# Patient Record
Sex: Male | Born: 1935 | Hispanic: No | Marital: Married | Smoking: Never smoker
Health system: Southern US, Community
[De-identification: ages and names within clinical notes are randomized; demographics above are authoritative.]

## PROBLEM LIST (undated history)

## (undated) DIAGNOSIS — R413 Other amnesia: Secondary | ICD-10-CM

## (undated) DIAGNOSIS — N200 Calculus of kidney: Secondary | ICD-10-CM

## (undated) DIAGNOSIS — I119 Hypertensive heart disease without heart failure: Secondary | ICD-10-CM

## (undated) DIAGNOSIS — R2689 Other abnormalities of gait and mobility: Secondary | ICD-10-CM

## (undated) DIAGNOSIS — M48061 Spinal stenosis, lumbar region without neurogenic claudication: Secondary | ICD-10-CM

## (undated) DIAGNOSIS — I6523 Occlusion and stenosis of bilateral carotid arteries: Secondary | ICD-10-CM

## (undated) DIAGNOSIS — I2510 Atherosclerotic heart disease of native coronary artery without angina pectoris: Secondary | ICD-10-CM

## (undated) DIAGNOSIS — M47816 Spondylosis without myelopathy or radiculopathy, lumbar region: Secondary | ICD-10-CM

## (undated) DIAGNOSIS — C801 Malignant (primary) neoplasm, unspecified: Secondary | ICD-10-CM

## (undated) DIAGNOSIS — R569 Unspecified convulsions: Secondary | ICD-10-CM

## (undated) DIAGNOSIS — N189 Chronic kidney disease, unspecified: Secondary | ICD-10-CM

## (undated) HISTORY — DX: Occlusion and stenosis of bilateral carotid arteries: I65.23

## (undated) HISTORY — DX: Other amnesia: R41.3

## (undated) HISTORY — PX: BOWEL RESECTION: SHX1257

## (undated) HISTORY — DX: Hypertensive heart disease without heart failure: I11.9

## (undated) HISTORY — PX: OTHER SURGICAL HISTORY: SHX169

## (undated) HISTORY — DX: Malignant (primary) neoplasm, unspecified: C80.1

## (undated) HISTORY — DX: Chronic kidney disease, unspecified: N18.9

## (undated) HISTORY — PX: BACK SURGERY: SHX140

## (undated) HISTORY — DX: Other abnormalities of gait and mobility: R26.89

## (undated) HISTORY — PX: CHOLECYSTECTOMY: SHX55

## (undated) HISTORY — DX: Calculus of kidney: N20.0

## (undated) HISTORY — DX: Spondylosis without myelopathy or radiculopathy, lumbar region: M47.816

## (undated) HISTORY — DX: Spinal stenosis, lumbar region without neurogenic claudication: M48.061

---

## 2001-07-16 ENCOUNTER — Encounter: Admission: RE | Admit: 2001-07-16 | Discharge: 2001-07-16 | Payer: Self-pay | Admitting: Gastroenterology

## 2001-07-16 ENCOUNTER — Encounter: Payer: Self-pay | Admitting: Gastroenterology

## 2013-03-24 ENCOUNTER — Other Ambulatory Visit: Payer: Self-pay | Admitting: Internal Medicine

## 2013-03-24 DIAGNOSIS — N289 Disorder of kidney and ureter, unspecified: Secondary | ICD-10-CM

## 2013-03-25 ENCOUNTER — Ambulatory Visit
Admission: RE | Admit: 2013-03-25 | Discharge: 2013-03-25 | Disposition: A | Discharge status: Home / Self Care | Payer: Medicare Other | Source: Ambulatory Visit | Attending: Internal Medicine | Admitting: Internal Medicine

## 2013-03-25 DIAGNOSIS — N289 Disorder of kidney and ureter, unspecified: Secondary | ICD-10-CM

## 2014-10-10 DIAGNOSIS — I351 Nonrheumatic aortic (valve) insufficiency: Secondary | ICD-10-CM | POA: Diagnosis not present

## 2014-10-10 DIAGNOSIS — E782 Mixed hyperlipidemia: Secondary | ICD-10-CM | POA: Diagnosis not present

## 2014-10-10 DIAGNOSIS — I25119 Atherosclerotic heart disease of native coronary artery with unspecified angina pectoris: Secondary | ICD-10-CM | POA: Diagnosis not present

## 2014-10-19 DIAGNOSIS — I2510 Atherosclerotic heart disease of native coronary artery without angina pectoris: Secondary | ICD-10-CM | POA: Diagnosis not present

## 2014-10-19 DIAGNOSIS — I501 Left ventricular failure: Secondary | ICD-10-CM | POA: Diagnosis not present

## 2014-10-19 DIAGNOSIS — E782 Mixed hyperlipidemia: Secondary | ICD-10-CM | POA: Diagnosis not present

## 2014-11-15 DIAGNOSIS — E559 Vitamin D deficiency, unspecified: Secondary | ICD-10-CM | POA: Diagnosis not present

## 2014-11-15 DIAGNOSIS — N183 Chronic kidney disease, stage 3 (moderate): Secondary | ICD-10-CM | POA: Diagnosis not present

## 2014-11-15 DIAGNOSIS — I1310 Hypertensive heart and chronic kidney disease without heart failure, with stage 1 through stage 4 chronic kidney disease, or unspecified chronic kidney disease: Secondary | ICD-10-CM | POA: Diagnosis not present

## 2014-11-16 DIAGNOSIS — D225 Melanocytic nevi of trunk: Secondary | ICD-10-CM | POA: Diagnosis not present

## 2014-11-16 DIAGNOSIS — L821 Other seborrheic keratosis: Secondary | ICD-10-CM | POA: Diagnosis not present

## 2014-11-16 DIAGNOSIS — L570 Actinic keratosis: Secondary | ICD-10-CM | POA: Diagnosis not present

## 2014-11-16 DIAGNOSIS — D1801 Hemangioma of skin and subcutaneous tissue: Secondary | ICD-10-CM | POA: Diagnosis not present

## 2014-11-16 DIAGNOSIS — N183 Chronic kidney disease, stage 3 (moderate): Secondary | ICD-10-CM | POA: Diagnosis not present

## 2014-12-06 DIAGNOSIS — M5126 Other intervertebral disc displacement, lumbar region: Secondary | ICD-10-CM | POA: Diagnosis not present

## 2014-12-06 DIAGNOSIS — M4806 Spinal stenosis, lumbar region: Secondary | ICD-10-CM | POA: Diagnosis not present

## 2014-12-06 DIAGNOSIS — M47816 Spondylosis without myelopathy or radiculopathy, lumbar region: Secondary | ICD-10-CM | POA: Diagnosis not present

## 2014-12-27 DIAGNOSIS — Z0000 Encounter for general adult medical examination without abnormal findings: Secondary | ICD-10-CM | POA: Diagnosis not present

## 2014-12-27 DIAGNOSIS — E782 Mixed hyperlipidemia: Secondary | ICD-10-CM | POA: Diagnosis not present

## 2014-12-27 DIAGNOSIS — Z8349 Family history of other endocrine, nutritional and metabolic diseases: Secondary | ICD-10-CM | POA: Diagnosis not present

## 2014-12-27 DIAGNOSIS — Z1389 Encounter for screening for other disorder: Secondary | ICD-10-CM | POA: Diagnosis not present

## 2014-12-27 DIAGNOSIS — N183 Chronic kidney disease, stage 3 (moderate): Secondary | ICD-10-CM | POA: Diagnosis not present

## 2015-05-17 DIAGNOSIS — L821 Other seborrheic keratosis: Secondary | ICD-10-CM | POA: Diagnosis not present

## 2015-05-17 DIAGNOSIS — L570 Actinic keratosis: Secondary | ICD-10-CM | POA: Diagnosis not present

## 2015-05-22 DIAGNOSIS — I1310 Hypertensive heart and chronic kidney disease without heart failure, with stage 1 through stage 4 chronic kidney disease, or unspecified chronic kidney disease: Secondary | ICD-10-CM | POA: Diagnosis not present

## 2015-05-22 DIAGNOSIS — I10 Essential (primary) hypertension: Secondary | ICD-10-CM | POA: Diagnosis not present

## 2015-05-22 DIAGNOSIS — E559 Vitamin D deficiency, unspecified: Secondary | ICD-10-CM | POA: Diagnosis not present

## 2015-05-22 DIAGNOSIS — N183 Chronic kidney disease, stage 3 (moderate): Secondary | ICD-10-CM | POA: Diagnosis not present

## 2015-05-31 DIAGNOSIS — I10 Essential (primary) hypertension: Secondary | ICD-10-CM | POA: Diagnosis not present

## 2015-05-31 DIAGNOSIS — N183 Chronic kidney disease, stage 3 (moderate): Secondary | ICD-10-CM | POA: Diagnosis not present

## 2015-07-05 DIAGNOSIS — M47816 Spondylosis without myelopathy or radiculopathy, lumbar region: Secondary | ICD-10-CM | POA: Diagnosis not present

## 2015-07-06 DIAGNOSIS — Z23 Encounter for immunization: Secondary | ICD-10-CM | POA: Diagnosis not present

## 2015-07-10 DIAGNOSIS — M4806 Spinal stenosis, lumbar region: Secondary | ICD-10-CM | POA: Diagnosis not present

## 2015-07-10 DIAGNOSIS — M5126 Other intervertebral disc displacement, lumbar region: Secondary | ICD-10-CM | POA: Diagnosis not present

## 2015-07-10 DIAGNOSIS — Z9889 Other specified postprocedural states: Secondary | ICD-10-CM | POA: Diagnosis not present

## 2015-07-10 DIAGNOSIS — M47816 Spondylosis without myelopathy or radiculopathy, lumbar region: Secondary | ICD-10-CM | POA: Diagnosis not present

## 2015-07-10 DIAGNOSIS — M545 Low back pain: Secondary | ICD-10-CM | POA: Diagnosis not present

## 2015-07-13 DIAGNOSIS — G9389 Other specified disorders of brain: Secondary | ICD-10-CM | POA: Diagnosis not present

## 2015-07-13 DIAGNOSIS — M4806 Spinal stenosis, lumbar region: Secondary | ICD-10-CM | POA: Diagnosis not present

## 2015-07-13 DIAGNOSIS — M5126 Other intervertebral disc displacement, lumbar region: Secondary | ICD-10-CM | POA: Diagnosis not present

## 2015-07-13 DIAGNOSIS — M47816 Spondylosis without myelopathy or radiculopathy, lumbar region: Secondary | ICD-10-CM | POA: Diagnosis not present

## 2015-07-17 DIAGNOSIS — C61 Malignant neoplasm of prostate: Secondary | ICD-10-CM | POA: Diagnosis not present

## 2015-07-20 DIAGNOSIS — I6523 Occlusion and stenosis of bilateral carotid arteries: Secondary | ICD-10-CM | POA: Diagnosis not present

## 2015-07-20 DIAGNOSIS — I119 Hypertensive heart disease without heart failure: Secondary | ICD-10-CM | POA: Diagnosis not present

## 2015-07-20 DIAGNOSIS — G9389 Other specified disorders of brain: Secondary | ICD-10-CM | POA: Diagnosis not present

## 2015-07-20 DIAGNOSIS — R55 Syncope and collapse: Secondary | ICD-10-CM | POA: Diagnosis not present

## 2015-07-20 DIAGNOSIS — M47816 Spondylosis without myelopathy or radiculopathy, lumbar region: Secondary | ICD-10-CM | POA: Diagnosis not present

## 2015-07-24 DIAGNOSIS — Z87442 Personal history of urinary calculi: Secondary | ICD-10-CM | POA: Diagnosis not present

## 2015-07-24 DIAGNOSIS — C61 Malignant neoplasm of prostate: Secondary | ICD-10-CM | POA: Diagnosis not present

## 2015-07-24 DIAGNOSIS — R351 Nocturia: Secondary | ICD-10-CM | POA: Diagnosis not present

## 2015-08-25 ENCOUNTER — Encounter: Payer: Self-pay | Admitting: Vascular Surgery

## 2015-08-25 ENCOUNTER — Other Ambulatory Visit: Payer: Self-pay | Admitting: *Deleted

## 2015-08-25 DIAGNOSIS — I6521 Occlusion and stenosis of right carotid artery: Secondary | ICD-10-CM

## 2015-08-28 DIAGNOSIS — Z6822 Body mass index (BMI) 22.0-22.9, adult: Secondary | ICD-10-CM | POA: Diagnosis not present

## 2015-08-28 DIAGNOSIS — M5136 Other intervertebral disc degeneration, lumbar region: Secondary | ICD-10-CM | POA: Diagnosis not present

## 2015-08-28 DIAGNOSIS — Z8546 Personal history of malignant neoplasm of prostate: Secondary | ICD-10-CM | POA: Diagnosis not present

## 2015-08-28 DIAGNOSIS — I6529 Occlusion and stenosis of unspecified carotid artery: Secondary | ICD-10-CM | POA: Diagnosis not present

## 2015-08-28 DIAGNOSIS — Z1389 Encounter for screening for other disorder: Secondary | ICD-10-CM | POA: Diagnosis not present

## 2015-08-28 DIAGNOSIS — I25118 Atherosclerotic heart disease of native coronary artery with other forms of angina pectoris: Secondary | ICD-10-CM | POA: Diagnosis not present

## 2015-09-09 DIAGNOSIS — J069 Acute upper respiratory infection, unspecified: Secondary | ICD-10-CM | POA: Diagnosis not present

## 2015-09-09 DIAGNOSIS — Z719 Counseling, unspecified: Secondary | ICD-10-CM | POA: Diagnosis not present

## 2015-09-19 ENCOUNTER — Encounter: Payer: Self-pay | Admitting: Vascular Surgery

## 2015-09-24 DIAGNOSIS — R569 Unspecified convulsions: Secondary | ICD-10-CM

## 2015-09-24 HISTORY — DX: Unspecified convulsions: R56.9

## 2015-09-26 ENCOUNTER — Ambulatory Visit (HOSPITAL_COMMUNITY)
Admission: RE | Admit: 2015-09-26 | Discharge: 2015-09-26 | Disposition: A | Discharge status: Home / Self Care | Payer: Medicare Other | Source: Ambulatory Visit | Attending: Vascular Surgery | Admitting: Vascular Surgery

## 2015-09-26 ENCOUNTER — Ambulatory Visit (INDEPENDENT_AMBULATORY_CARE_PROVIDER_SITE_OTHER): Payer: Medicare Other | Admitting: Vascular Surgery

## 2015-09-26 ENCOUNTER — Encounter: Payer: Self-pay | Admitting: Vascular Surgery

## 2015-09-26 VITALS — BP 169/66 | HR 50 | Temp 97.7°F | Resp 24 | Ht 71.0 in | Wt 163.2 lb

## 2015-09-26 DIAGNOSIS — I6521 Occlusion and stenosis of right carotid artery: Secondary | ICD-10-CM | POA: Insufficient documentation

## 2015-09-26 DIAGNOSIS — I6529 Occlusion and stenosis of unspecified carotid artery: Secondary | ICD-10-CM | POA: Insufficient documentation

## 2015-09-26 NOTE — Progress Notes (Signed)
Filed Vitals:   09/26/15 1124 09/26/15 1126  BP: 153/71 170/68  Pulse: 50   Temp: 97.7 F (36.5 C)   TempSrc: Oral   Resp: 24   Height: 5\' 11"  (1.803 m)   Weight: 163 lb 3.2 oz (74.027 kg)    Filed Vitals:   09/26/15 1126 09/26/15 1132  BP: 170/68 169/66  Pulse:    Temp:    Resp:

## 2015-09-26 NOTE — Progress Notes (Signed)
Vascular and Vein Specialist of [facility]  Patient name: Larry Ortega MRN: [id number] DOB: 31-Dec-1923 Sex: male  REASON FOR CONSULT:  Asymptomatic carotid disease  HPI: Larry Ortega is a 82 y.o. male, who is  Seen today for discussion of asymptomatic carotid disease. He had a recent duplex at [facility] adjusting 70-99% stenosis in his right internal carotid artery and less than 50% stenosis on the left internal carotid artery. He is right-handed.  He specifically denies any prior episodes of stroke, transient ischemic attack or amaurosis fugax. Does have a history of lumbar disc disease and has some numbness in his left leg apparently due to  Radicular symptoms. He has no history of cardiac difficulties. No history of lower from the arterial insufficiency  Past Medical History  Diagnosis Date  . Lumbar spondylosis   . Lumbar spinal stenosis   . Bilateral carotid artery occlusion   . Hypertensive heart disease without congestive heart failure   . Chronic kidney disease   . Cancer [facility])     prostate    History reviewed. No pertinent family history.  SOCIAL HISTORY: Social History   Social History  . Marital Status: Married    Spouse Name: N/A  . Number of Children: N/A  . Years of Education: N/A   Occupational History  . Not on file.   Social History Main Topics  . Smoking status: Never Smoker   . Smokeless tobacco: Never Used  . Alcohol Use: Not on file  . Drug Use: Not on file  . Sexual Activity: Not on file   Other Topics Concern  . Not on file   Social History Narrative    No Known Allergies  Current Outpatient Prescriptions  Medication Sig Dispense Refill  . aspirin 81 MG tablet Take 81 mg by mouth daily.    . clopidogrel (PLAVIX) 75 MG tablet Take 75 mg by mouth daily.    . metoprolol tartrate (LOPRESSOR) 25 MG tablet Take 25 mg by mouth 2 (two) times daily.    Marland Kitchen omeprazole (PRILOSEC) 20 MG capsule Take 20 mg by mouth daily.    . simvastatin  (ZOCOR) 20 MG tablet Take 20 mg by mouth daily.     No current facility-administered medications for this visit.    REVIEW OF SYSTEMS:  [X]  denotes positive finding, [ ]  denotes negative finding Cardiac  Comments:  Chest pain or chest pressure:    Shortness of breath upon exertion:    Short of breath when lying flat:    Irregular heart rhythm:        Vascular    Pain in calf, thigh, or hip brought on by ambulation: x   Pain in feet at night that wakes you up from your sleep:     Blood clot in your veins:    Leg swelling:         Pulmonary    Oxygen at home:    Productive cough:     Wheezing:         Neurologic    Sudden weakness in arms or legs:  x   Sudden numbness in arms or legs:  x   Sudden onset of difficulty speaking or slurred speech:    Temporary loss of vision in one eye:     Problems with dizziness:         Gastrointestinal    Blood in stool:     Vomited blood:         Genitourinary  Burning when urinating:     Blood in urine:        Psychiatric    Major depression:         Hematologic    Bleeding problems:    Problems with blood clotting too easily:        Skin    Rashes or ulcers:        Constitutional    Fever or chills:      PHYSICAL EXAM: Filed Vitals:   09/26/15 1124 09/26/15 1126 09/26/15 1132  BP: 153/71 170/68 169/66  Pulse: 50    Temp: 97.7 F (36.5 C)    TempSrc: Oral    Resp: 24    Height: 5\' 11"  (1.803 m)    Weight: 163 lb 3.2 oz (74.027 kg)      GENERAL: The patient is a well-nourished male, in no acute distress. The vital signs are documented above. CARDIAC: There is a regular rate and rhythm.  VASCULAR:  2+ radial and 2+ dorsalis pedis pulses bilaterally. Carotid arteries without bruits bilaterally PULMONARY: There is good air exchange bilaterally without wheezing or rales. ABDOMEN: Soft and non-tender with normal pitched bowel sounds.  No aneurysm palpable MUSCULOSKELETAL: There are no major deformities or  cyanosis. NEUROLOGIC: No focal weakness or paresthesias are detected. SKIN: There are no ulcers or rashes noted. PSYCHIATRIC: The patient has a normal affect.  DATA:   I did review his carotid duplex from [facility]. He had repeat imaging of the right carotid in our lab today as well. His right systolic velocity in the internal carotid was 378 cm/s. End-diastolic velocities were 48 cm/s. This is very similar to the results found at [facility].  MEDICAL ISSUES:  had long discussion with the patient's wife present. I explained that he clearly has no symptoms related to his right carotid stenosis. I did explain symptoms that this would calls. He is right-handed. I explained that in our lab the same velocities would be an targeted has a 40-59% stenosis. I explained that he is below the threshold where we would recommend endarterectomy for asymptomatic disease. I did suggest ongoing 6 month interval follow-up to rule out any regression and is asymptomatic disease. Also explained that if he did have symptoms that were clearly related to his right hemisphere we would recommend endarterectomy based on this. He was reassured with this discussion and we will see him again in 6 months with repeat carotid duplex.   Curt Jews [facility]ists of [place]: [phone]

## 2015-09-27 ENCOUNTER — Encounter: Payer: Self-pay | Admitting: Neurosurgery

## 2015-09-28 ENCOUNTER — Other Ambulatory Visit: Payer: Self-pay | Admitting: *Deleted

## 2015-09-28 DIAGNOSIS — I6523 Occlusion and stenosis of bilateral carotid arteries: Secondary | ICD-10-CM

## 2015-10-10 DIAGNOSIS — I7389 Other specified peripheral vascular diseases: Secondary | ICD-10-CM | POA: Diagnosis not present

## 2015-10-10 DIAGNOSIS — R001 Bradycardia, unspecified: Secondary | ICD-10-CM | POA: Diagnosis not present

## 2015-10-10 DIAGNOSIS — I350 Nonrheumatic aortic (valve) stenosis: Secondary | ICD-10-CM | POA: Diagnosis not present

## 2015-10-13 DIAGNOSIS — I25119 Atherosclerotic heart disease of native coronary artery with unspecified angina pectoris: Secondary | ICD-10-CM | POA: Diagnosis not present

## 2015-10-13 DIAGNOSIS — E782 Mixed hyperlipidemia: Secondary | ICD-10-CM | POA: Diagnosis not present

## 2015-10-24 DIAGNOSIS — I371 Nonrheumatic pulmonary valve insufficiency: Secondary | ICD-10-CM | POA: Diagnosis not present

## 2015-10-24 DIAGNOSIS — I361 Nonrheumatic tricuspid (valve) insufficiency: Secondary | ICD-10-CM | POA: Diagnosis not present

## 2015-10-24 DIAGNOSIS — I351 Nonrheumatic aortic (valve) insufficiency: Secondary | ICD-10-CM | POA: Diagnosis not present

## 2015-10-24 DIAGNOSIS — I340 Nonrheumatic mitral (valve) insufficiency: Secondary | ICD-10-CM | POA: Diagnosis not present

## 2015-11-02 DIAGNOSIS — L821 Other seborrheic keratosis: Secondary | ICD-10-CM | POA: Diagnosis not present

## 2015-11-02 DIAGNOSIS — L570 Actinic keratosis: Secondary | ICD-10-CM | POA: Diagnosis not present

## 2015-11-10 DIAGNOSIS — N183 Chronic kidney disease, stage 3 (moderate): Secondary | ICD-10-CM | POA: Diagnosis not present

## 2015-11-10 DIAGNOSIS — I1310 Hypertensive heart and chronic kidney disease without heart failure, with stage 1 through stage 4 chronic kidney disease, or unspecified chronic kidney disease: Secondary | ICD-10-CM | POA: Diagnosis not present

## 2015-11-29 DIAGNOSIS — H02221 Mechanical lagophthalmos right upper eyelid: Secondary | ICD-10-CM | POA: Diagnosis not present

## 2015-11-29 DIAGNOSIS — Z961 Presence of intraocular lens: Secondary | ICD-10-CM | POA: Diagnosis not present

## 2015-11-29 DIAGNOSIS — H02052 Trichiasis without entropian right lower eyelid: Secondary | ICD-10-CM | POA: Diagnosis not present

## 2015-12-05 DIAGNOSIS — M47816 Spondylosis without myelopathy or radiculopathy, lumbar region: Secondary | ICD-10-CM | POA: Diagnosis not present

## 2015-12-05 DIAGNOSIS — M4806 Spinal stenosis, lumbar region: Secondary | ICD-10-CM | POA: Diagnosis not present

## 2015-12-05 DIAGNOSIS — I6523 Occlusion and stenosis of bilateral carotid arteries: Secondary | ICD-10-CM | POA: Diagnosis not present

## 2015-12-11 DIAGNOSIS — R3916 Straining to void: Secondary | ICD-10-CM | POA: Diagnosis not present

## 2015-12-11 DIAGNOSIS — C61 Malignant neoplasm of prostate: Secondary | ICD-10-CM | POA: Diagnosis not present

## 2015-12-11 DIAGNOSIS — R351 Nocturia: Secondary | ICD-10-CM | POA: Diagnosis not present

## 2015-12-20 DIAGNOSIS — R351 Nocturia: Secondary | ICD-10-CM | POA: Diagnosis not present

## 2015-12-20 DIAGNOSIS — R3916 Straining to void: Secondary | ICD-10-CM | POA: Diagnosis not present

## 2015-12-20 DIAGNOSIS — C61 Malignant neoplasm of prostate: Secondary | ICD-10-CM | POA: Diagnosis not present

## 2016-01-08 DIAGNOSIS — N183 Chronic kidney disease, stage 3 (moderate): Secondary | ICD-10-CM | POA: Diagnosis not present

## 2016-01-08 DIAGNOSIS — I25118 Atherosclerotic heart disease of native coronary artery with other forms of angina pectoris: Secondary | ICD-10-CM | POA: Diagnosis not present

## 2016-01-08 DIAGNOSIS — Z125 Encounter for screening for malignant neoplasm of prostate: Secondary | ICD-10-CM | POA: Diagnosis not present

## 2016-01-08 DIAGNOSIS — Z0000 Encounter for general adult medical examination without abnormal findings: Secondary | ICD-10-CM | POA: Diagnosis not present

## 2016-01-15 DIAGNOSIS — I25118 Atherosclerotic heart disease of native coronary artery with other forms of angina pectoris: Secondary | ICD-10-CM | POA: Diagnosis not present

## 2016-01-15 DIAGNOSIS — M5136 Other intervertebral disc degeneration, lumbar region: Secondary | ICD-10-CM | POA: Diagnosis not present

## 2016-01-15 DIAGNOSIS — Z0000 Encounter for general adult medical examination without abnormal findings: Secondary | ICD-10-CM | POA: Diagnosis not present

## 2016-01-15 DIAGNOSIS — R7309 Other abnormal glucose: Secondary | ICD-10-CM | POA: Diagnosis not present

## 2016-01-15 DIAGNOSIS — Z13820 Encounter for screening for osteoporosis: Secondary | ICD-10-CM | POA: Diagnosis not present

## 2016-01-15 DIAGNOSIS — I6529 Occlusion and stenosis of unspecified carotid artery: Secondary | ICD-10-CM | POA: Diagnosis not present

## 2016-01-15 DIAGNOSIS — D692 Other nonthrombocytopenic purpura: Secondary | ICD-10-CM | POA: Diagnosis not present

## 2016-01-15 DIAGNOSIS — N183 Chronic kidney disease, stage 3 (moderate): Secondary | ICD-10-CM | POA: Diagnosis not present

## 2016-01-15 DIAGNOSIS — Z6822 Body mass index (BMI) 22.0-22.9, adult: Secondary | ICD-10-CM | POA: Diagnosis not present

## 2016-01-15 DIAGNOSIS — Z1389 Encounter for screening for other disorder: Secondary | ICD-10-CM | POA: Diagnosis not present

## 2016-01-15 DIAGNOSIS — Z8546 Personal history of malignant neoplasm of prostate: Secondary | ICD-10-CM | POA: Diagnosis not present

## 2016-03-11 DIAGNOSIS — I1310 Hypertensive heart and chronic kidney disease without heart failure, with stage 1 through stage 4 chronic kidney disease, or unspecified chronic kidney disease: Secondary | ICD-10-CM | POA: Diagnosis not present

## 2016-03-11 DIAGNOSIS — N183 Chronic kidney disease, stage 3 (moderate): Secondary | ICD-10-CM | POA: Diagnosis not present

## 2016-03-22 ENCOUNTER — Encounter: Payer: Self-pay | Admitting: Vascular Surgery

## 2016-04-02 ENCOUNTER — Ambulatory Visit (INDEPENDENT_AMBULATORY_CARE_PROVIDER_SITE_OTHER): Payer: Medicare Other | Admitting: Vascular Surgery

## 2016-04-02 ENCOUNTER — Ambulatory Visit (HOSPITAL_COMMUNITY)
Admission: RE | Admit: 2016-04-02 | Discharge: 2016-04-02 | Disposition: A | Discharge status: Home / Self Care | Payer: Medicare Other | Source: Ambulatory Visit | Attending: Vascular Surgery | Admitting: Vascular Surgery

## 2016-04-02 ENCOUNTER — Encounter: Payer: Self-pay | Admitting: Vascular Surgery

## 2016-04-02 VITALS — BP 128/62 | HR 47 | Temp 97.3°F | Resp 16 | Ht 72.0 in | Wt 160.0 lb

## 2016-04-02 DIAGNOSIS — N189 Chronic kidney disease, unspecified: Secondary | ICD-10-CM | POA: Insufficient documentation

## 2016-04-02 DIAGNOSIS — I1310 Hypertensive heart and chronic kidney disease without heart failure, with stage 1 through stage 4 chronic kidney disease, or unspecified chronic kidney disease: Secondary | ICD-10-CM | POA: Insufficient documentation

## 2016-04-02 DIAGNOSIS — I6521 Occlusion and stenosis of right carotid artery: Secondary | ICD-10-CM

## 2016-04-02 DIAGNOSIS — I6523 Occlusion and stenosis of bilateral carotid arteries: Secondary | ICD-10-CM | POA: Insufficient documentation

## 2016-04-02 NOTE — Progress Notes (Signed)
Vascular and Vein Specialist of [facility]  Patient name: Larry Ortega MRN: [id number] DOB: 10/10/1923 Sex: male  REASON FOR VISIT: Follow-up carotid stenosis, asymptomatic  HPI: [name] is a 82 y.o. male seen today for follow-up of asymptomatic carotid disease. This is been discovered with duplex 6 months ago. He is here today for follow-up. He reports no neurologic deficits.Specifically no amaurosis fugax, transient ischemic attack or stroke. He does have no new cardiac disease. He reports that he did step on a piece of glass the last week or 2. Appears to be healing.  Past Medical History  Diagnosis Date  . Lumbar spondylosis   . Lumbar spinal stenosis   . Bilateral carotid artery occlusion   . Hypertensive heart disease without congestive heart failure   . Chronic kidney disease   . Cancer [facility])     prostate    History reviewed. No pertinent family history.  SOCIAL HISTORY: Social History  Substance Use Topics  . Smoking status: Never Smoker   . Smokeless tobacco: Never Used  . Alcohol Use: Not on file    No Known Allergies  Current Outpatient Prescriptions  Medication Sig Dispense Refill  . aspirin 81 MG tablet Take 81 mg by mouth daily.    . clopidogrel (PLAVIX) 75 MG tablet Take 75 mg by mouth daily.    . metoprolol tartrate (LOPRESSOR) 25 MG tablet Take 25 mg by mouth 2 (two) times daily.    Marland Kitchen omeprazole (PRILOSEC) 20 MG capsule Take 20 mg by mouth daily.    . simvastatin (ZOCOR) 20 MG tablet Take 20 mg by mouth daily.     No current facility-administered medications for this visit.    REVIEW OF SYSTEMS:  [X]  denotes positive finding, [ ]  denotes negative finding Cardiac  Comments:  Chest pain or chest pressure:    Shortness of breath upon exertion:    Short of breath when lying flat:    Irregular heart rhythm:        Vascular    Pain in calf, thigh, or hip brought on by ambulation:    Pain in feet at night  that wakes you up from your sleep:     Blood clot in your veins:    Leg swelling:         Pulmonary    Oxygen at home:    Productive cough:     Wheezing:         Neurologic    Sudden weakness in arms or legs:     Sudden numbness in arms or legs:     Sudden onset of difficulty speaking or slurred speech:    Temporary loss of vision in one eye:     Problems with dizziness:         Gastrointestinal    Blood in stool:     Vomited blood:         Genitourinary    Burning when urinating:     Blood in urine:        Psychiatric    Major depression:         Hematologic    Bleeding problems:    Problems with blood clotting too easily:        Skin    Rashes or ulcers:        Constitutional    Fever or chills:      PHYSICAL EXAM: Filed Vitals:   04/02/16 1005  BP: 128/62  Pulse: 47  Temp: 97.3 F (  36.3 C)  TempSrc: Oral  Resp: 16  Height: 6' (1.829 m)  Weight: 160 lb (72.576 kg)  SpO2: 97%    GENERAL: The patient is a well-nourished male, in no acute distress. The vital signs are documented above. CARDIAC: There is a regular rate and rhythm.  VASCULAR: 2+ radial and 2+ posterior tibial pulses bilaterally. I do not hear carotid bruits today. PULMONARY: There is good air exchange bilaterally without wheezing or rales. MUSCULOSKELETAL: There are no major deformities or cyanosis. NEUROLOGIC: No focal weakness or paresthesias are detected. SKIN: There are no ulcers or rashes noted. PSYCHIATRIC: The patient has a normal affect.  DATA:  Carotid duplex today was compared to study from 6 months ago. This does show 40-59% stenosis in the right internal carotid artery and no significant stenosis left carotid.  MEDICAL ISSUES: Stable asymptomatic carotid stenosis. Assistant at length with patient. Recommend yearly duplex to rule out progression. Again reviewed symptoms of carotid disease he knows to notify us immediately should this occur. We'll follow up in one year with  duplex and evaluation with our nurse practitioner    Rosetta Posner, MD [facility] V[facility] and Vein Specialists of [facility] Tel [phone] Pager [phone]

## 2016-04-20 ENCOUNTER — Encounter (HOSPITAL_COMMUNITY): Payer: Self-pay | Admitting: Emergency Medicine

## 2016-04-20 ENCOUNTER — Inpatient Hospital Stay (HOSPITAL_COMMUNITY): Payer: Medicare Other

## 2016-04-20 ENCOUNTER — Inpatient Hospital Stay (HOSPITAL_COMMUNITY)
Admission: EM | Admit: 2016-04-20 | Discharge: 2016-04-25 | DRG: 100 | Disposition: A | Discharge status: Rehabilitation Facility | Payer: Medicare Other | Attending: Internal Medicine | Admitting: Internal Medicine

## 2016-04-20 ENCOUNTER — Emergency Department (HOSPITAL_COMMUNITY): Payer: Medicare Other

## 2016-04-20 DIAGNOSIS — I6300 Cerebral infarction due to thrombosis of unspecified precerebral artery: Secondary | ICD-10-CM | POA: Diagnosis not present

## 2016-04-20 DIAGNOSIS — I639 Cerebral infarction, unspecified: Secondary | ICD-10-CM | POA: Diagnosis not present

## 2016-04-20 DIAGNOSIS — Z23 Encounter for immunization: Secondary | ICD-10-CM | POA: Diagnosis not present

## 2016-04-20 DIAGNOSIS — I63512 Cerebral infarction due to unspecified occlusion or stenosis of left middle cerebral artery: Secondary | ICD-10-CM | POA: Diagnosis present

## 2016-04-20 DIAGNOSIS — R34 Anuria and oliguria: Secondary | ICD-10-CM | POA: Diagnosis present

## 2016-04-20 DIAGNOSIS — I6789 Other cerebrovascular disease: Secondary | ICD-10-CM | POA: Diagnosis not present

## 2016-04-20 DIAGNOSIS — W19XXXA Unspecified fall, initial encounter: Secondary | ICD-10-CM

## 2016-04-20 DIAGNOSIS — E876 Hypokalemia: Secondary | ICD-10-CM | POA: Diagnosis not present

## 2016-04-20 DIAGNOSIS — Z7902 Long term (current) use of antithrombotics/antiplatelets: Secondary | ICD-10-CM

## 2016-04-20 DIAGNOSIS — R29818 Other symptoms and signs involving the nervous system: Secondary | ICD-10-CM | POA: Diagnosis not present

## 2016-04-20 DIAGNOSIS — G40409 Other generalized epilepsy and epileptic syndromes, not intractable, without status epilepticus: Principal | ICD-10-CM | POA: Diagnosis present

## 2016-04-20 DIAGNOSIS — I69320 Aphasia following cerebral infarction: Secondary | ICD-10-CM | POA: Diagnosis not present

## 2016-04-20 DIAGNOSIS — R451 Restlessness and agitation: Secondary | ICD-10-CM | POA: Diagnosis present

## 2016-04-20 DIAGNOSIS — W1839XA Other fall on same level, initial encounter: Secondary | ICD-10-CM | POA: Diagnosis not present

## 2016-04-20 DIAGNOSIS — Z66 Do not resuscitate: Secondary | ICD-10-CM | POA: Diagnosis present

## 2016-04-20 DIAGNOSIS — R29810 Facial weakness: Secondary | ICD-10-CM | POA: Diagnosis present

## 2016-04-20 DIAGNOSIS — R4701 Aphasia: Secondary | ICD-10-CM | POA: Diagnosis present

## 2016-04-20 DIAGNOSIS — Z8673 Personal history of transient ischemic attack (TIA), and cerebral infarction without residual deficits: Secondary | ICD-10-CM | POA: Diagnosis not present

## 2016-04-20 DIAGNOSIS — N179 Acute kidney failure, unspecified: Secondary | ICD-10-CM | POA: Diagnosis present

## 2016-04-20 DIAGNOSIS — B004 Herpesviral encephalitis: Secondary | ICD-10-CM | POA: Diagnosis present

## 2016-04-20 DIAGNOSIS — R197 Diarrhea, unspecified: Secondary | ICD-10-CM | POA: Diagnosis not present

## 2016-04-20 DIAGNOSIS — Z79899 Other long term (current) drug therapy: Secondary | ICD-10-CM | POA: Diagnosis not present

## 2016-04-20 DIAGNOSIS — Y92230 Patient room in hospital as the place of occurrence of the external cause: Secondary | ICD-10-CM | POA: Diagnosis not present

## 2016-04-20 DIAGNOSIS — I6340 Cerebral infarction due to embolism of unspecified cerebral artery: Secondary | ICD-10-CM

## 2016-04-20 DIAGNOSIS — S0990XA Unspecified injury of head, initial encounter: Secondary | ICD-10-CM | POA: Diagnosis not present

## 2016-04-20 DIAGNOSIS — J9600 Acute respiratory failure, unspecified whether with hypoxia or hypercapnia: Secondary | ICD-10-CM | POA: Diagnosis present

## 2016-04-20 DIAGNOSIS — A419 Sepsis, unspecified organism: Secondary | ICD-10-CM | POA: Diagnosis not present

## 2016-04-20 DIAGNOSIS — R269 Unspecified abnormalities of gait and mobility: Secondary | ICD-10-CM | POA: Diagnosis not present

## 2016-04-20 DIAGNOSIS — Z7982 Long term (current) use of aspirin: Secondary | ICD-10-CM | POA: Diagnosis not present

## 2016-04-20 DIAGNOSIS — R739 Hyperglycemia, unspecified: Secondary | ICD-10-CM | POA: Diagnosis present

## 2016-04-20 DIAGNOSIS — I119 Hypertensive heart disease without heart failure: Secondary | ICD-10-CM | POA: Diagnosis present

## 2016-04-20 DIAGNOSIS — R569 Unspecified convulsions: Secondary | ICD-10-CM | POA: Diagnosis not present

## 2016-04-20 DIAGNOSIS — I6523 Occlusion and stenosis of bilateral carotid arteries: Secondary | ICD-10-CM | POA: Diagnosis present

## 2016-04-20 DIAGNOSIS — G9340 Encephalopathy, unspecified: Secondary | ICD-10-CM | POA: Diagnosis not present

## 2016-04-20 DIAGNOSIS — I10 Essential (primary) hypertension: Secondary | ICD-10-CM | POA: Diagnosis not present

## 2016-04-20 LAB — DIFFERENTIAL
Basophils Absolute: 0 10*3/uL (ref 0.0–0.1)
Basophils Relative: 0 %
EOS PCT: 3 %
Eosinophils Absolute: 0.3 10*3/uL (ref 0.0–0.7)
LYMPHS ABS: 2.7 10*3/uL (ref 0.7–4.0)
LYMPHS PCT: 28 %
MONO ABS: 0.6 10*3/uL (ref 0.1–1.0)
Monocytes Relative: 6 %
Neutro Abs: 5.9 10*3/uL (ref 1.7–7.7)
Neutrophils Relative %: 63 %

## 2016-04-20 LAB — POCT I-STAT 3, ART BLOOD GAS (G3+)
ACID-BASE EXCESS: 3 mmol/L — AB (ref 0.0–2.0)
BICARBONATE: 27.9 meq/L — AB (ref 20.0–24.0)
O2 SAT: 100 %
PCO2 ART: 43.1 mmHg (ref 35.0–45.0)
PH ART: 7.416 (ref 7.350–7.450)
PO2 ART: 478 mmHg — AB (ref 80.0–100.0)
Patient temperature: 97.7
TCO2: 29 mmol/L (ref 0–100)

## 2016-04-20 LAB — COMPREHENSIVE METABOLIC PANEL
ALK PHOS: 53 U/L (ref 38–126)
ALT: 24 U/L (ref 17–63)
ANION GAP: 8 (ref 5–15)
AST: 27 U/L (ref 15–41)
Albumin: 3.6 g/dL (ref 3.5–5.0)
BILIRUBIN TOTAL: 0.8 mg/dL (ref 0.3–1.2)
BUN: 19 mg/dL (ref 6–20)
CALCIUM: 9.6 mg/dL (ref 8.9–10.3)
CO2: 22 mmol/L (ref 22–32)
Chloride: 110 mmol/L (ref 101–111)
Creatinine, Ser: 1.47 mg/dL — ABNORMAL HIGH (ref 0.61–1.24)
GFR, EST AFRICAN AMERICAN: 47 mL/min — AB (ref >60)
GFR, EST NON AFRICAN AMERICAN: 40 mL/min — AB (ref >60)
Glucose, Bld: 111 mg/dL — ABNORMAL HIGH (ref 65–99)
Potassium: 4.8 mmol/L (ref 3.5–5.1)
Sodium: 140 mmol/L (ref 135–145)
TOTAL PROTEIN: 6.2 g/dL — AB (ref 6.5–8.1)

## 2016-04-20 LAB — URINALYSIS, ROUTINE W REFLEX MICROSCOPIC
Bilirubin Urine: NEGATIVE
Glucose, UA: NEGATIVE mg/dL
KETONES UR: NEGATIVE mg/dL
LEUKOCYTES UA: NEGATIVE
NITRITE: NEGATIVE
PH: 6.5 (ref 5.0–8.0)
Protein, ur: NEGATIVE mg/dL
SPECIFIC GRAVITY, URINE: 1.016 (ref 1.005–1.030)

## 2016-04-20 LAB — RAPID URINE DRUG SCREEN, HOSP PERFORMED
Amphetamines: NOT DETECTED
Barbiturates: NOT DETECTED
Benzodiazepines: POSITIVE — AB
Cocaine: NOT DETECTED
OPIATES: NOT DETECTED
TETRAHYDROCANNABINOL: NOT DETECTED

## 2016-04-20 LAB — URINE MICROSCOPIC-ADD ON
BACTERIA UA: NONE SEEN
WBC, UA: NONE SEEN WBC/hpf (ref 0–5)

## 2016-04-20 LAB — CBC
HCT: 40.1 % (ref 39.0–52.0)
HEMOGLOBIN: 13.2 g/dL (ref 13.0–17.0)
MCH: 30.6 pg (ref 26.0–34.0)
MCHC: 32.9 g/dL (ref 30.0–36.0)
MCV: 93 fL (ref 78.0–100.0)
Platelets: 232 10*3/uL (ref 150–400)
RBC: 4.31 MIL/uL (ref 4.22–5.81)
RDW: 13.6 % (ref 11.5–15.5)
WBC: 9.5 10*3/uL (ref 4.0–10.5)

## 2016-04-20 LAB — I-STAT CHEM 8, ED
BUN: 24 mg/dL — AB (ref 6–20)
CALCIUM ION: 1.13 mmol/L (ref 1.12–1.23)
CREATININE: 1.5 mg/dL — AB (ref 0.61–1.24)
Chloride: 107 mmol/L (ref 101–111)
Glucose, Bld: 108 mg/dL — ABNORMAL HIGH (ref 65–99)
HEMATOCRIT: 39 % (ref 39.0–52.0)
HEMOGLOBIN: 13.3 g/dL (ref 13.0–17.0)
POTASSIUM: 4.8 mmol/L (ref 3.5–5.1)
SODIUM: 141 mmol/L (ref 135–145)
TCO2: 23 mmol/L (ref 0–100)

## 2016-04-20 LAB — CBG MONITORING, ED
Glucose-Capillary: 107 mg/dL — ABNORMAL HIGH (ref 65–99)
Glucose-Capillary: 157 mg/dL — ABNORMAL HIGH (ref 65–99)

## 2016-04-20 LAB — APTT: aPTT: 30 seconds (ref 24–36)

## 2016-04-20 LAB — TRIGLYCERIDES: Triglycerides: 154 mg/dL — ABNORMAL HIGH (ref <150)

## 2016-04-20 LAB — PROTIME-INR
INR: 0.98
PROTHROMBIN TIME: 12.9 s (ref 11.4–15.2)

## 2016-04-20 LAB — ETHANOL: Alcohol, Ethyl (B): <5 mg/dL (ref <5)

## 2016-04-20 LAB — MRSA PCR SCREENING: MRSA by PCR: NEGATIVE

## 2016-04-20 LAB — GLUCOSE, CAPILLARY
GLUCOSE-CAPILLARY: 95 mg/dL (ref 65–99)
Glucose-Capillary: 139 mg/dL — ABNORMAL HIGH (ref 65–99)

## 2016-04-20 LAB — I-STAT TROPONIN, ED: TROPONIN I, POC: 0 ng/mL (ref 0.00–0.08)

## 2016-04-20 MED ORDER — CLOPIDOGREL BISULFATE 75 MG PO TABS
75.0000 mg | ORAL_TABLET | Freq: Every day | ORAL | Status: DC
  Administered 2016-04-21 – 2016-04-25 (×5): 75 mg
  Filled 2016-04-20 (×5): qty 1

## 2016-04-20 MED ORDER — CHLORHEXIDINE GLUCONATE 0.12% ORAL RINSE (MEDLINE KIT)
15.0000 mL | Freq: Two times a day (BID) | OROMUCOSAL | Status: DC
  Administered 2016-04-20 – 2016-04-24 (×8): 15 mL via OROMUCOSAL

## 2016-04-20 MED ORDER — ENOXAPARIN SODIUM 40 MG/0.4ML ~~LOC~~ SOLN
40.0000 mg | SUBCUTANEOUS | Status: DC
  Administered 2016-04-21 – 2016-04-25 (×5): 40 mg via SUBCUTANEOUS
  Filled 2016-04-20 (×5): qty 0.4

## 2016-04-20 MED ORDER — LORAZEPAM 2 MG/ML IJ SOLN
INTRAMUSCULAR | Status: AC
  Administered 2016-04-20: 1 mg via INTRAVENOUS
  Filled 2016-04-20: qty 1

## 2016-04-20 MED ORDER — SODIUM CHLORIDE 0.9 % IV SOLN: 250.0000 mL | INTRAVENOUS | Status: DC | PRN

## 2016-04-20 MED ORDER — HYDRALAZINE HCL 20 MG/ML IJ SOLN: 10.0000 mg | INTRAMUSCULAR | Status: DC | PRN

## 2016-04-20 MED ORDER — FENTANYL CITRATE (PF) 100 MCG/2ML IJ SOLN: 50.0000 ug | INTRAMUSCULAR | Status: DC | PRN

## 2016-04-20 MED ORDER — FENTANYL CITRATE (PF) 100 MCG/2ML IJ SOLN
50.0000 ug | INTRAMUSCULAR | Status: DC | PRN
  Administered 2016-04-22: 50 ug via INTRAVENOUS
  Filled 2016-04-20: qty 2

## 2016-04-20 MED ORDER — PROPOFOL 1000 MG/100ML IV EMUL
0.0000 ug/kg/min | INTRAVENOUS | Status: DC
  Administered 2016-04-20 (×2): 25 ug/kg/min via INTRAVENOUS
  Administered 2016-04-21: 20 ug/kg/min via INTRAVENOUS
  Administered 2016-04-21: 35 ug/kg/min via INTRAVENOUS
  Administered 2016-04-22: 20 ug/kg/min via INTRAVENOUS
  Administered 2016-04-22 – 2016-04-23 (×3): 30 ug/kg/min via INTRAVENOUS
  Administered 2016-04-23: 15 ug/kg/min via INTRAVENOUS
  Filled 2016-04-20 (×7): qty 100

## 2016-04-20 MED ORDER — FAMOTIDINE IN NACL 20-0.9 MG/50ML-% IV SOLN
20.0000 mg | INTRAVENOUS | Status: DC
  Administered 2016-04-20 – 2016-04-24 (×5): 20 mg via INTRAVENOUS
  Filled 2016-04-20 (×5): qty 50

## 2016-04-20 MED ORDER — FENTANYL CITRATE (PF) 100 MCG/2ML IJ SOLN
100.0000 ug | Freq: Once | INTRAMUSCULAR | Status: AC
  Administered 2016-04-20: 100 ug via INTRAVENOUS

## 2016-04-20 MED ORDER — LORAZEPAM 2 MG/ML IJ SOLN
2.0000 mg | INTRAMUSCULAR | Status: DC | PRN
  Administered 2016-04-20: 1 mg via INTRAVENOUS

## 2016-04-20 MED ORDER — ASPIRIN 81 MG PO CHEW
81.0000 mg | CHEWABLE_TABLET | Freq: Every day | ORAL | Status: DC
  Administered 2016-04-21 – 2016-04-25 (×5): 81 mg
  Filled 2016-04-20 (×5): qty 1

## 2016-04-20 MED ORDER — SODIUM CHLORIDE 0.9 % IV SOLN
500.0000 mg | Freq: Two times a day (BID) | INTRAVENOUS | Status: DC
  Administered 2016-04-22 – 2016-04-25 (×8): 500 mg via INTRAVENOUS
  Filled 2016-04-20 (×11): qty 5

## 2016-04-20 MED ORDER — SODIUM CHLORIDE 0.9 % IV SOLN
1000.0000 mg | Freq: Once | INTRAVENOUS | Status: AC
  Administered 2016-04-20: 1000 mg via INTRAVENOUS
  Filled 2016-04-20: qty 10

## 2016-04-20 MED ORDER — INSULIN ASPART 100 UNIT/ML ~~LOC~~ SOLN
0.0000 [IU] | SUBCUTANEOUS | Status: DC
  Administered 2016-04-20 – 2016-04-23 (×6): 1 [IU] via SUBCUTANEOUS
  Administered 2016-04-23 – 2016-04-24 (×2): 2 [IU] via SUBCUTANEOUS
  Administered 2016-04-24: 1 [IU] via SUBCUTANEOUS
  Administered 2016-04-24 – 2016-04-25 (×2): 2 [IU] via SUBCUTANEOUS
  Administered 2016-04-25 (×3): 1 [IU] via SUBCUTANEOUS

## 2016-04-20 MED ORDER — ANTISEPTIC ORAL RINSE SOLUTION (CORINZ)
7.0000 mL | OROMUCOSAL | Status: DC
  Administered 2016-04-20 – 2016-04-23 (×26): 7 mL via OROMUCOSAL

## 2016-04-20 MED ORDER — LORAZEPAM 2 MG/ML IJ SOLN
4.0000 mg | Freq: Once | INTRAMUSCULAR | Status: AC
  Administered 2016-04-20: 4 mg via INTRAVENOUS
  Filled 2016-04-20: qty 2

## 2016-04-20 MED ORDER — LORAZEPAM 2 MG/ML IJ SOLN
2.0000 mg | Freq: Once | INTRAMUSCULAR | Status: AC
  Administered 2016-04-20: 2 mg via INTRAVENOUS

## 2016-04-20 MED ORDER — ASPIRIN EC 81 MG PO TBEC: 81.0000 mg | DELAYED_RELEASE_TABLET | Freq: Every day | ORAL | Status: DC

## 2016-04-20 MED ORDER — CLOPIDOGREL BISULFATE 75 MG PO TABS: 75.0000 mg | ORAL_TABLET | Freq: Every day | ORAL | Status: DC

## 2016-04-20 NOTE — ED Provider Notes (Signed)
Pt d/w Dr. Titus Mould (ICU) who spoke with the family.  The decision was made to take pt to the ICU to intubate and get a MRI.  Depending on the MRI results, pt's family may withdrawal support.  Pt was made a DNR.   Isla Pence, MD 04/20/16 1714

## 2016-04-20 NOTE — ED Notes (Signed)
CBG 157.  Informed Lennette Bihari, RN.

## 2016-04-20 NOTE — ED Notes (Signed)
Dr Tasia Catchings into speak to family

## 2016-04-20 NOTE — Procedures (Signed)
Intubation Procedure Note Mariusz Shirazi [id number] 07-10-1924  Procedure: Intubation Indications: Airway protection and maintenance  Procedure Details Consent: Risks of procedure as well as the alternatives and risks of each were explained to the (patient/caregiver).  Consent for procedure obtained. Time Out: Verified patient identification, verified procedure, site/side was marked, verified correct patient position, special equipment/implants available, medications/allergies/relevent history reviewed, required imaging and test results available.  Performed  Maximum sterile technique was used including gloves, gown, hand hygiene, mask and sheet.  MAC and 4    Evaluation Hemodynamic Status: BP stable throughout; O2 sats: stable throughout Patient's Current Condition: stable Complications: No apparent complications Patient did tolerate procedure well. Chest X-ray ordered to verify placement.  CXR: pending.   Raylene Miyamoto 04/20/2016  I removed partials prior He had bilous mild material in airway suctioned, no sig aspiration during  Lavon Paganini. Titus Mould, MD, [place] Pgr: [place] [facility]

## 2016-04-20 NOTE — H&P (Signed)
PULMONARY / CRITICAL CARE MEDICINE   Name: Larry Ortega MRN: [id number] DOB: November 29, 1923    ADMISSION DATE:  04/20/2016 CONSULTATION DATE:  04/20/16  REFERRING MD:  EDP  CHIEF COMPLAINT:  AMS, seizure, facial droop  HISTORY OF PRESENT ILLNESS:   Larry Ortega is a 82 year old male with a PMH of carotid stenosis (followed by Dr. Donnetta Hutching) who presented to the ED with confusion and facial droop. This morning around 10:30am, he started feeling dizzy and walked outside. His family noticed he was confused. He walked out to his car, sat inside, and would not get out. He started talking funny and was not making sense. Family called 911. Per family, he has never had a stroke before.  At baseline, Pt is very healthy and active. He mows the lawn, takes full care of himself, completes all ADLs independently, takes care of his finances, etc.  In the ED, code stroke was called. He was evaluated by Dr. Tasia Catchings (Neurology) who thought he likely had a L MCA stroke, given his aphasia and right facial droop. CT head showed diffuse atrophy and low density area in the right frontal region that appeared chronic in nature. Around 3:45pm, he had a witnessed seizure in the ED. He was given 1mg  Ativan with resolution of seizure. PCCM was called for admission due to concern that Pt was unable to protect his airway.  PAST MEDICAL HISTORY :  He  has a past medical history of Bilateral carotid artery occlusion; Cancer ([place]); Chronic kidney disease; Hypertensive heart disease without congestive heart failure; Lumbar spinal stenosis; and Lumbar spondylosis.  PAST SURGICAL HISTORY: He  has a past surgical history that includes Cholecystectomy; eyelid surgery; and Bowel resection.  No Known Allergies  No current facility-administered medications on file prior to encounter.    Current Outpatient Prescriptions on File Prior to Encounter  Medication Sig  . aspirin 81 MG tablet Take 81 mg by mouth daily.  . clopidogrel  (PLAVIX) 75 MG tablet Take 75 mg by mouth daily.  . metoprolol tartrate (LOPRESSOR) 25 MG tablet Take 25 mg by mouth 2 (two) times daily.  Marland Kitchen omeprazole (PRILOSEC) 20 MG capsule Take 20 mg by mouth daily.  . simvastatin (ZOCOR) 20 MG tablet Take 20 mg by mouth daily.    FAMILY HISTORY:  His indicated that his mother is deceased. He indicated that his father is deceased.    SOCIAL HISTORY: He  reports that he has never smoked. He has never used smokeless tobacco.  REVIEW OF SYSTEMS:   Unable to obtain- Pt is post-ictal. Per family, he was not complaining of any headaches, blurry vision, chest pain, shortness of breath, or abdominal pain earlier today.  VITAL SIGNS: BP (!) 166/91   Pulse 81   Temp 97.5 F (36.4 C) (Rectal)   Resp 16   Wt 72.6 kg (160 lb 0.9 oz)   SpO2 100%   BMI 21.71 kg/m   HEMODYNAMICS:    VENTILATOR SETTINGS: Vent Mode: PRVC FiO2 (%):  [100 %] 100 % Set Rate:  [14 bmp-16 bmp] 14 bmp Vt Set:  [620 mL] 620 mL PEEP:  [5 cmH20] 5 cmH20 Plateau Pressure:  [10 cmH20] 10 cmH20  INTAKE / OUTPUT: No intake/output data recorded.  PHYSICAL EXAMINATION: General: Elderly male, well-developed, well-nourished Neuro:  Sleepy, does not open eyes, does not follow commands, moves all extremities spontaneously, 5/5 muscle strength throughout. HEENT: [place]/AT, EOMI, PERRL, MMM Cardiovascular:  RRR, no murmurs Lungs: CTAB, no wheezes Abdomen:  +BS, soft,  non-tender, non-distended Musculoskeletal:  No edema Skin: No rashes or lesions  LABS:  BMET  Recent Labs Lab 04/20/16 1410 04/20/16 1418  NA 140 141  K 4.8 4.8  CL 110 107  CO2 22  --   BUN 19 24*  CREATININE 1.47* 1.50*  GLUCOSE 111* 108*    Electrolytes  Recent Labs Lab 04/20/16 1410  CALCIUM 9.6    CBC  Recent Labs Lab 04/20/16 1410 04/20/16 1418  WBC 9.5  --   HGB 13.2 13.3  HCT 40.1 39.0  PLT 232  --     Coag's  Recent Labs Lab 04/20/16 1410  APTT 30  INR 0.98    Sepsis  Markers No results for input(s): LATICACIDVEN, PROCALCITON, O2SATVEN in the last 168 hours.  ABG No results for input(s): PHART, PCO2ART, PO2ART in the last 168 hours.  Liver Enzymes  Recent Labs Lab 04/20/16 1410  AST 27  ALT 24  ALKPHOS 53  BILITOT 0.8  ALBUMIN 3.6    Cardiac Enzymes No results for input(s): TROPONINI, PROBNP in the last 168 hours.  Glucose  Recent Labs Lab 04/20/16 1409 04/20/16 1558  GLUCAP 107* 157*    STUDIES:  7/29 CT head: No acute abnormality, previous right frontal lobe subcortical infarct, microvascular disease, atrophy  CULTURES:   ANTIBIOTICS:   SIGNIFICANT EVENTS: 7/29: Came into ED for stroke symptoms, witnessed seizure in ED, intubated for airway protection, admitted to CCM service  LINES/TUBES: ETT 7/29 >>  OG tube 7/29 >> Foley 7/29 >> PIV 7/29 >>   DISCUSSION: Tavante Frizzell is a 82 year old male with a PMH of carotid stenosis who presented to the ED with aphasia and right sided facial droop. Code stroke was called. Pt had a witnessed seizure in the ED, which resolved with Ativan. There was concern that he was unable to protect his airway. PCCM was called for admission and Pt was intubated.  ASSESSMENT / PLAN:  PULMONARY A: Intubated for inability to protect airway P:   Full vent support Check ABG in 1 hour  CARDIOVASCULAR A:  Permissive hypertension P:  If MRI without bleed, will add prn med for SBP >220. ECHO per stroke work-up. Telemetry  RENAL A:   AKI? No baseline Cr for comparison. P:   BMET, mag, phos in the am  GASTROINTESTINAL A:   NPO P:   SLP for swallow eval when extubated PPI for SUP  HEMATOLOGIC A:   DVT prophylaxis P:  Lovenox sq Trend CBC  INFECTIOUS A:   No acute issues P:   Monitor  ENDOCRINE A:   Hyperglycemia P:   CBG monitoring Sensitive SSI  NEUROLOGIC A:   Concern for L MCA stroke Seizures- likely related to stroke; no h/o seizures Sedation on vent P:    RASS goal: -2 / -3 Neurology following. MRI/MRA brain. If MRI brain does not show any bleeding, will restart ASA and plavix ECHO Carotid dopplers AM lipid panel EEG Keppra load, then 500mg  bid Ativan prn for breakthrough seizures Propofol gtt with fentanyl prn   FAMILY  - Updates: Family updated in the ED by Dr. Titus Mould. - Decision made to make Pt DNR  - Inter-disciplinary family meet or Palliative Care meeting due by:  04/26/16  Hyman Bible, MD PGY-2  04/20/2016, 6:01 PM  STAFF NOTE: Linwood Dibbles, MD FACP have personally reviewed patient's available data, including medical history, events of note, physical examination and test results as part of my evaluation. I have discussed with resident/NP  and other care providers such as pharmacist, RN and RRT. In addition, I personally evaluated patient and elicited key findings of: initially not moving rt side, perrl, increasing agitation in ED, int likely seizure activity upper ext, lungs clear, CT neg but history concering for acute CVA leading to reoccurent seizure activity ( 2 in ED), I have had extensive discussions with family wife and 2 kids. We discussed patients current circumstances and organ failures. We also discussed patient's prior wishes under circumstances such as this. Family has decided to NOT perform resuscitation if arrest but to continue current medical support for now. They wish short term intubation until MRI and reversibility prognosticated, asa, plavix held untl we r/o hemmorghic transformation of ischemic cva, if MRI neg bleed then will restart and push sys goals back to permissive 220 from 180 now, placed ETT did see bile in airwaymild, no sig aspiration noted but happy we placed ett when we did, place OGT to suction, STAT abg, pcxr post intubation, I updated family in full in ER and outside of the neuro icu  The patient is critically ill with multiple organ systems failure and requires high complexity decision  making for assessment and support, frequent evaluation and titration of therapies, application of advanced monitoring technologies and extensive interpretation of multiple databases.   Critical Care Time devoted to patient care services described in this note is 45  Minutes. This time reflects time of care of this signee: Merrie Roof, MD FACP. This critical care time does not reflect procedure time, or teaching time or supervisory time of PA/NP/Med student/Med Resident etc but could involve care discussion time. Rest per NP/medical resident whose note is outlined above and that I agree with   Lavon Paganini. Titus Mould, MD, [place] Pgr: [place] [facility] 04/20/2016 9:46 PM

## 2016-04-20 NOTE — ED Notes (Signed)
CBG 107 

## 2016-04-20 NOTE — Progress Notes (Signed)
eLink Physician-Brief Progress Note Patient Name: Davidanthony Masino DOB: 1924-02-08 MRN: [id number]   Date of Service  04/20/2016  HPI/Events of Note  82 yr old male admitted with acute cva complicated by seizure and trouble protecting airway.  eICU Interventions  Chart reviewed.  Patient evaluated by Dr Titus Mould     Intervention Category Evaluation Type: New Patient Evaluation  Mauri [place], Mamie Nick 04/20/2016, 7:12 PM

## 2016-04-20 NOTE — Consult Note (Signed)
Initial Neurological Consultation                      NEURO HOSPITALIST CONSULT NOTE   Requestig physician:  Dr. Wyvonnia Dusky   Reason for Consult:  Aphasia with right facial droop   HPI:                                                                                                                                         Larry Ortega is a pleasant 82 year old gentleman who presents today with the onset of aphasia and right facial droop that began at or around 11 AM. The family reports he had just driven his car for approximately 2 hours and was not experiencing any problems. There was a delay in contacting EMS as they were not clear that this was in a neurological emergency.   Larry Ortega arrived in the emergency room shortly after 2 PM. He was taken to the CT scanner where the imaging revealed diffuse atrophy and a low-density area in the right frontal region that appeared chronic in nature. There is no evidence of hemorrhage or mass lesion.   Past Medical History:  Diagnosis Date  . Bilateral carotid artery occlusion   . Cancer [facility])    prostate  . Chronic kidney disease   . Hypertensive heart disease without congestive heart failure   . Lumbar spinal stenosis   . Lumbar spondylosis     Past Surgical History:  Procedure Laterality Date  . BOWEL RESECTION    . CHOLECYSTECTOMY    . eyelid surgery      MEDICATIONS:                                                                                                                     I have reviewed the patient's current medications.  No Known Allergies   Social History:  reports that he has never smoked. He has never used smokeless tobacco. His alcohol and drug histories are not on file.  No family history on file.   ROS:  History obtained from chart review  General ROS: negative for - chills,  fatigue, fever, night sweats, weight gain or weight loss Psychological ROS: negative for - behavioral disorder, hallucinations, memory difficulties, mood swings or suicidal ideation Ophthalmic ROS: negative for - blurry vision, double vision, eye pain or loss of vision ENT ROS: negative for - epistaxis, nasal discharge, oral lesions, sore throat, tinnitus or vertigo Allergy and Immunology ROS: negative for - hives or itchy/watery eyes Hematological and Lymphatic ROS: negative for - bleeding problems, bruising or swollen lymph nodes Endocrine ROS: negative for - galactorrhea, hair pattern changes, polydipsia/polyuria or temperature intolerance Respiratory ROS: negative for - cough, hemoptysis, shortness of breath or wheezing Cardiovascular ROS: negative for - chest pain, dyspnea on exertion, edema or irregular heartbeat Gastrointestinal ROS: negative for - abdominal pain, diarrhea, hematemesis, nausea/vomiting or stool incontinence Genito-Urinary ROS: negative for - dysuria, hematuria, incontinence or urinary frequency/urgency Musculoskeletal ROS: negative for - joint swelling or muscular weakness Neurological ROS: as noted in HPI Dermatological ROS: negative for rash and skin lesion changes   General Exam                                                                                                      There were no vitals taken for this visit. HEENT-  Normocephalic, no lesions, without obvious abnormality.  Normal external eye and conjunctiva.  Normal TM's bilaterally.  Normal auditory canals and external ears. Normal external nose, mucus membranes and septum.  Normal pharynx. Cardiovascular- regular rate and rhythm, S1, S2 normal, no murmur, click, rub or gallop, pulses palpable throughout   Lungs- chest clear, no wheezing, rales, normal symmetric air entry, Heart exam - S1, S2 normal, no murmur, no gallop, rate regular Abdomen- soft, non-tender; bowel sounds normal; no masses,  no  organomegaly Extremities- less then 2 second capillary refill Lymph-no adenopathy palpable Musculoskeletal-no joint tenderness, deformity or swelling Skin-warm and dry, no hyperpigmentation, vitiligo, or suspicious lesions  Neurological Examination Mental Status: Larry Ortega is a phasic he is unable to follow any commands. He does not understand any speech. Cranial Nerves: Pupils are equal round reactive to light and accommodation, there is a right facial droop. Motor: There is spontaneous movement of all 4 extremities against gravity. Sensory: Sensation is grossly intact and non-noxious stimuli. Deep Tendon Reflexes: 1-2+ throughout. Plantars: Right: downgoing   Left: downgoing Cerebellar: This could not be performed.     Lab Results: Basic Metabolic Panel:  Recent Labs Lab 04/20/16 1418  NA 141  K 4.8  CL 107  GLUCOSE 108*  BUN 24*  CREATININE 1.50*    Liver Function Tests: No results for input(s): AST, ALT, ALKPHOS, BILITOT, PROT, ALBUMIN in the last 168 hours. No results for input(s): LIPASE, AMYLASE in the last 168 hours. No results for input(s): AMMONIA in the last 168 hours.  CBC:  Recent Labs Lab 04/20/16 1418  HGB 13.3  HCT 39.0    Cardiac Enzymes: No results for input(s): CKTOTAL, CKMB, CKMBINDEX, TROPONINI in the last 168 hours.  Lipid Panel: No results for input(s): CHOL, TRIG, HDL, CHOLHDL,  VLDL, LDLCALC in the last 168 hours.  CBG:  Recent Labs Lab 04/20/16 1409  GLUCAP 107*    Microbiology: No results found for this or any previous visit.  Coagulation Studies: No results for input(s): LABPROT, INR in the last 72 hours.  Imaging: No results found.  Assessment/Plan:  Larry Ortega is a pleasant 82 year old gentleman who presents today over 3 hours after the onset of a aphasia and a right facial droop. As such he is outside the regular time window for TPA. Additional criteria for administration of TPA within 3-4-1/2 hours was reviewed and  discussed with the family. It was noted that a relative contraindication to TPA is age over 93. It was discussed that this increase in age is a relative contraindication to the use of TPA after 3 hours.   After considering the risk and benefits of TPA the family requested that we not administer the TPA at this time. It was felt that this was a very reasonable choice. Given that the patient was moving all 4 extremities it was not felt that CT angiogram or interventional radiology would be of benefit.  The presentation appears most consistent with a left middle cerebral artery distribution infarction. The CT reveals evidence of an old right frontal infarction. The imaging study was reviewed with radiology team.  Plan:  1. MRI and MRA of the brain  2. Echocardiogram and carotid Dopplers.  3. Telemetry monitoring with frequent neuro checks.  4. DVT prophylaxis  5. PT and OT consults speech therapy evaluation. Nothing by mouth until speech therapy has cleared patient.  6. Fasting lipid panel.  Thank you for consulting the neurology service to assist in the care of your patient!   Brienne Liguori A. Tasia Catchings, M.D. Neurohospitalist Phone: [phone]  04/20/2016, 2:25 PM

## 2016-04-20 NOTE — ED Notes (Signed)
Placed seizure pads on bed rails, per Caren Griffins, Therapist, sports.

## 2016-04-20 NOTE — ED Triage Notes (Signed)
Pt in via [facility], per report the pt LSN @11am  with family noticing symptoms at 11am after driving from [place], pt aphasic, with R sided facial droop, pt hx of carotid dz, pt combative in route

## 2016-04-20 NOTE — Procedures (Signed)
OGt placement Noted bile in airway, wanted to avoid asp Placed oGt without resistance Good air noted on examination by RN Placed to suction  Larry Ortega. Titus Mould, MD, [place] Pgr: [facility]

## 2016-04-20 NOTE — ED Notes (Signed)
Neurologist Tasia Catchings, MD at bedside aware on new onset seizure activity

## 2016-04-20 NOTE — ED Provider Notes (Signed)
[facility] DEPT Provider Note   CSN: [id number] Arrival date & time: 04/20/16  1408  First Provider Contact:  First MD Initiated Contact with Patient 04/20/16 1423        History   Chief Complaint No chief complaint on file.   HPI Larry Ortega is a 82 y.o. male.  Level V caveat for altered mental status and acuity of condition. Patient with sudden onset a facial and right facial droop. Last seen normal was 11 AM. Per family, patient drove himself from [place] and was normal about 10:30 this morning. He was normal this morning as well. He is aphasic and unable to give a history. He is combative and moving all extremities and not following commands. History of prostate cancer, carotid stenosis. No reported history of blood thinner use.   The history is provided by the patient, the EMS personnel and a relative. The history is limited by the condition of the patient.    Past Medical History:  Diagnosis Date  . Bilateral carotid artery occlusion   . Cancer [facility])    prostate  . Chronic kidney disease   . Hypertensive heart disease without congestive heart failure   . Lumbar spinal stenosis   . Lumbar spondylosis     Patient Active Problem List   Diagnosis Date Noted  . Carotid stenosis 09/26/2015    Past Surgical History:  Procedure Laterality Date  . BOWEL RESECTION    . CHOLECYSTECTOMY    . eyelid surgery         Home Medications    Prior to Admission medications   Medication Sig Start Date End Date Taking? Authorizing Provider  aspirin 81 MG tablet Take 81 mg by mouth daily.    Historical Provider, MD  clopidogrel (PLAVIX) 75 MG tablet Take 75 mg by mouth daily.    Historical Provider, MD  metoprolol tartrate (LOPRESSOR) 25 MG tablet Take 25 mg by mouth 2 (two) times daily.    Historical Provider, MD  omeprazole (PRILOSEC) 20 MG capsule Take 20 mg by mouth daily.    Historical Provider, MD  simvastatin (ZOCOR) 20 MG tablet Take 20 mg by mouth  daily.    Historical Provider, MD    Family History No family history on file.  Social History Social History  Substance Use Topics  . Smoking status: Never Smoker  . Smokeless tobacco: Never Used  . Alcohol use Not on file     Allergies   Review of patient's allergies indicates no known allergies.   Review of Systems Review of Systems  Unable to perform ROS: Mental status change     Physical Exam Updated Vital Signs BP 119/72 (BP Location: Right Arm)   Pulse (!) 57   Resp 16   SpO2 98%   Physical Exam  Constitutional: He appears well-developed and well-nourished. No distress.  HENT:  Head: Normocephalic and atraumatic.  Right Ear: External ear normal.  Left Ear: External ear normal.  Mouth/Throat: Oropharynx is clear and moist.  Eyes: EOM are normal. Pupils are equal, round, and reactive to light.  Neck: Normal range of motion.  Cardiovascular: Normal rate, regular rhythm and normal heart sounds.   No murmur heard. Pulmonary/Chest: Breath sounds normal. No respiratory distress. He exhibits no tenderness.  Abdominal: Soft. He exhibits no distension. There is no tenderness. There is no guarding.  Musculoskeletal: Normal range of motion.  Neurological: He is alert. A cranial nerve deficit is present. Coordination abnormal.  Aphasic, agitated, possible right-sided facial droop,  not following commands moving all extremities equally. Left-sided gaze.     ED Treatments / Results  Labs (all labs ordered are listed, but only abnormal results are displayed) Labs Reviewed  COMPREHENSIVE METABOLIC PANEL - Abnormal; Notable for the following:       Result Value   Glucose, Bld 111 (*)    Creatinine, Ser 1.47 (*)    Total Protein 6.2 (*)    GFR calc non Af Amer 40 (*)    GFR calc Af Amer 47 (*)    All other components within normal limits  I-STAT CHEM 8, ED - Abnormal; Notable for the following:    BUN 24 (*)    Creatinine, Ser 1.50 (*)    Glucose, Bld 108 (*)     All other components within normal limits  CBG MONITORING, ED - Abnormal; Notable for the following:    Glucose-Capillary 107 (*)    All other components within normal limits  CBG MONITORING, ED - Abnormal; Notable for the following:    Glucose-Capillary 157 (*)    All other components within normal limits  ETHANOL  PROTIME-INR  APTT  CBC  DIFFERENTIAL  URINE RAPID DRUG SCREEN, HOSP PERFORMED  URINALYSIS, ROUTINE W REFLEX MICROSCOPIC (NOT AT [facility])  I-STAT TROPOININ, ED    EKG  EKG Interpretation  Date/Time:  Saturday April 20 2016 14:25:18 EDT Ventricular Rate:  56 PR Interval:    QRS Duration: 101 QT Interval:  446 QTC Calculation: 431 R Axis:   73 Text Interpretation:  Normal sinus rhythm Ventricular premature complex Anteroseptal infarct, age indeterminate Baseline wander in lead(s) V3 No previous ECGs available Confirmed by Wyvonnia Dusky  MD, [facility] [phone]) on 04/20/2016 2:47:22 PM       Radiology Ct Head Code Stroke Wo Contrast  Result Date: 04/20/2016 CLINICAL DATA:  Code stroke. Right-sided facial droop. Last seen normal at 11 a.m. EXAM: CT HEAD WITHOUT CONTRAST TECHNIQUE: Contiguous axial images were obtained from the base of the skull through the vertex without intravenous contrast. COMPARISON:  None. FINDINGS: Examination is degraded due to patient motion is staying the acquisition of additional images. Advanced atrophy with sulcal prominence. Old lacunar infarcts within the bilateral insular cortices, left greater than right. Scattered periventricular hypodensities compatible microvascular ischemic disease. Old subcortical infarct involving the right frontal lobe (image 22, series 301). Given background parenchymal abnormalities, there is no CT evidence superimposed acute large territory infarct. No intraparenchymal or extra-axial mass or hemorrhage. Normal configuration of the ventricles and basilar cisterns. No midline shift. Intracranial atherosclerosis. Limited  visualization the paranasal sinuses and mastoid air cells is normal. No air-fluid levels. Regional soft tissues appear normal. No radiopaque foreign body. Post bilateral cataract surgery. No displaced calvarial fracture. IMPRESSION: 1. No definite acute intracranial process. 2. Similar findings of prior right frontal lobe subcortical infarct, microvascular disease and atrophy as detailed above. Critical Value/emergent results were called by telephone at the time of interpretation on 04/20/2016 at 2:25 pm to Dr. Ezequiel Essex , who verbally acknowledged these results. Electronically Signed   By: Sandi Mariscal M.D.   On: 04/20/2016 14:32   Procedures Procedures (including critical care time)  Medications Ordered in ED Medications - No data to display   Initial Impression / Assessment and Plan / ED Course  I have reviewed the triage vital signs and the nursing notes.  Pertinent labs & imaging results that were available during my care of the patient were reviewed by me and considered in my medical decision making (see  chart for details).  Clinical Course  code stroke on arrival, last seen normal 11 AM. Patient with aphasia and right facial droop. Not following commands.  Seen with Dr. Tasia Catchings and stroke team on arrival.  CT head negative for hemorrhage or large infarct.  Patient's age is greater than 45, he has had symptoms for four and a half hours since symptom onset, his stroke score is 26.  Dr. Tasia Catchings does not recommend TPA. Family declines TPA after discussion of risks and benefits.  Admission to Hospital arranged. Patient had witnessed seizure approximately 3:45 PM by Dr. Daleen Bo. 1 mg Ativan given with resolution of seizure activity. Discussed with family that that airway protection might be indicated if patient is given further medications for seizures or if he continues to seize. They would like him to be intubated if necessary. He is protecting his airway at this time. We'll  switch admission to ICU, discussed with critical care.  D/w Dr. Tamala Julian who will evaluate.  CRITICAL CARE Performed by: Ezequiel Essex Total critical care time: 40 minutes Critical care time was exclusive of separately billable procedures and treating other patients. Critical care was necessary to treat or prevent imminent or life-threatening deterioration. Critical care was time spent personally by me on the following activities: development of treatment plan with patient and/or surrogate as well as nursing, discussions with consultants, evaluation of patient's response to treatment, examination of patient, obtaining history from patient or surrogate, ordering and performing treatments and interventions, ordering and review of laboratory studies, ordering and review of radiographic studies, pulse oximetry and re-evaluation of patient's condition.   Final Clinical Impressions(s) / ED Diagnoses   Final diagnoses:  Aphasia  Cerebral infarction due to unspecified mechanism    New Prescriptions New Prescriptions   No medications on file     Ezequiel Essex, MD 04/20/16 1623

## 2016-04-21 ENCOUNTER — Inpatient Hospital Stay (HOSPITAL_COMMUNITY): Payer: Medicare Other

## 2016-04-21 DIAGNOSIS — G9340 Encephalopathy, unspecified: Secondary | ICD-10-CM

## 2016-04-21 DIAGNOSIS — A419 Sepsis, unspecified organism: Secondary | ICD-10-CM

## 2016-04-21 DIAGNOSIS — R569 Unspecified convulsions: Secondary | ICD-10-CM

## 2016-04-21 DIAGNOSIS — J9600 Acute respiratory failure, unspecified whether with hypoxia or hypercapnia: Secondary | ICD-10-CM

## 2016-04-21 DIAGNOSIS — R4701 Aphasia: Secondary | ICD-10-CM

## 2016-04-21 DIAGNOSIS — E876 Hypokalemia: Secondary | ICD-10-CM

## 2016-04-21 LAB — PROTEIN AND GLUCOSE, CSF
GLUCOSE CSF: 62 mg/dL (ref 40–70)
Total  Protein, CSF: 66 mg/dL — ABNORMAL HIGH (ref 15–45)

## 2016-04-21 LAB — GLUCOSE, CAPILLARY
GLUCOSE-CAPILLARY: 89 mg/dL (ref 65–99)
GLUCOSE-CAPILLARY: 110 mg/dL — AB (ref 65–99)
Glucose-Capillary: 78 mg/dL (ref 65–99)
Glucose-Capillary: 80 mg/dL (ref 65–99)
Glucose-Capillary: 100 mg/dL — ABNORMAL HIGH (ref 65–99)
Glucose-Capillary: 119 mg/dL — ABNORMAL HIGH (ref 65–99)

## 2016-04-21 LAB — LIPID PANEL
CHOL/HDL RATIO: 3.5 ratio
Cholesterol: 109 mg/dL (ref 0–200)
HDL: 31 mg/dL — ABNORMAL LOW (ref >40)
LDL CALC: 55 mg/dL (ref 0–99)
Triglycerides: 115 mg/dL (ref <150)
VLDL: 23 mg/dL (ref 0–40)

## 2016-04-21 LAB — BASIC METABOLIC PANEL
Anion gap: 7 (ref 5–15)
BUN: 17 mg/dL (ref 6–20)
CALCIUM: 9.2 mg/dL (ref 8.9–10.3)
CO2: 24 mmol/L (ref 22–32)
CREATININE: 1.28 mg/dL — AB (ref 0.61–1.24)
Chloride: 106 mmol/L (ref 101–111)
GFR, EST AFRICAN AMERICAN: 55 mL/min — AB (ref >60)
GFR, EST NON AFRICAN AMERICAN: 48 mL/min — AB (ref >60)
Glucose, Bld: 98 mg/dL (ref 65–99)
Potassium: 3.4 mmol/L — ABNORMAL LOW (ref 3.5–5.1)
SODIUM: 137 mmol/L (ref 135–145)

## 2016-04-21 LAB — STREP PNEUMONIAE URINARY ANTIGEN: STREP PNEUMO URINARY ANTIGEN: NEGATIVE

## 2016-04-21 LAB — CBC
HCT: 37.7 % — ABNORMAL LOW (ref 39.0–52.0)
HEMOGLOBIN: 12.5 g/dL — AB (ref 13.0–17.0)
MCH: 30.3 pg (ref 26.0–34.0)
MCHC: 33.2 g/dL (ref 30.0–36.0)
MCV: 91.5 fL (ref 78.0–100.0)
PLATELETS: 212 10*3/uL (ref 150–400)
RBC: 4.12 MIL/uL — ABNORMAL LOW (ref 4.22–5.81)
RDW: 13.5 % (ref 11.5–15.5)
WBC: 11.8 10*3/uL — ABNORMAL HIGH (ref 4.0–10.5)

## 2016-04-21 LAB — MAGNESIUM: MAGNESIUM: 1.9 mg/dL (ref 1.7–2.4)

## 2016-04-21 LAB — CSF CELL COUNT WITH DIFFERENTIAL
EOS CSF: 0 % (ref 0–1)
RBC COUNT CSF: 37 /mm3 — AB
TUBE #: 1
WBC, CSF: 1 /mm3 (ref 0–5)

## 2016-04-21 LAB — PROCALCITONIN

## 2016-04-21 LAB — VITAMIN B12: VITAMIN B 12: 341 pg/mL (ref 180–914)

## 2016-04-21 LAB — T4, FREE: FREE T4: 0.82 ng/dL (ref 0.61–1.12)

## 2016-04-21 LAB — TSH: TSH: 2.967 u[IU]/mL (ref 0.350–4.500)

## 2016-04-21 LAB — PHOSPHORUS: PHOSPHORUS: 2.8 mg/dL (ref 2.5–4.6)

## 2016-04-21 MED ORDER — VANCOMYCIN HCL IN DEXTROSE 750-5 MG/150ML-% IV SOLN
750.0000 mg | INTRAVENOUS | Status: DC
  Filled 2016-04-21: qty 150

## 2016-04-21 MED ORDER — DEXTROSE 5 % IV SOLN
10.0000 mg/kg | Freq: Two times a day (BID) | INTRAVENOUS | Status: DC
  Administered 2016-04-21 – 2016-04-23 (×4): 645 mg via INTRAVENOUS
  Filled 2016-04-21 (×5): qty 12.9

## 2016-04-21 MED ORDER — DEXTROSE 5 % IV SOLN
2.0000 g | Freq: Two times a day (BID) | INTRAVENOUS | Status: DC
  Administered 2016-04-21 – 2016-04-22 (×2): 2 g via INTRAVENOUS
  Filled 2016-04-21 (×3): qty 2

## 2016-04-21 MED ORDER — PNEUMOCOCCAL VAC POLYVALENT 25 MCG/0.5ML IJ INJ
0.5000 mL | INJECTION | INTRAMUSCULAR | Status: AC
  Administered 2016-04-22: 0.5 mL via INTRAMUSCULAR
  Filled 2016-04-21: qty 0.5

## 2016-04-21 MED ORDER — LORAZEPAM 2 MG/ML IJ SOLN
1.0000 mg | INTRAMUSCULAR | Status: DC | PRN
Start: 2016-04-21 — End: 2016-04-25

## 2016-04-21 MED ORDER — POTASSIUM CHLORIDE 10 MEQ/100ML IV SOLN
10.0000 meq | INTRAVENOUS | Status: AC
  Administered 2016-04-21 (×2): 10 meq via INTRAVENOUS
  Filled 2016-04-21 (×2): qty 100

## 2016-04-21 MED ORDER — FENTANYL CITRATE (PF) 100 MCG/2ML IJ SOLN
25.0000 ug | INTRAMUSCULAR | Status: DC | PRN
  Administered 2016-04-22: 100 ug via INTRAVENOUS
  Filled 2016-04-21: qty 2

## 2016-04-21 MED ORDER — SODIUM CHLORIDE 0.9 % IV SOLN
2.0000 g | Freq: Four times a day (QID) | INTRAVENOUS | Status: DC
  Administered 2016-04-21 – 2016-04-22 (×3): 2 g via INTRAVENOUS
  Filled 2016-04-21 (×6): qty 2000

## 2016-04-21 MED ORDER — VANCOMYCIN HCL 10 G IV SOLR
1500.0000 mg | Freq: Once | INTRAVENOUS | Status: AC
  Administered 2016-04-21: 1500 mg via INTRAVENOUS
  Filled 2016-04-21: qty 1500

## 2016-04-21 NOTE — Progress Notes (Signed)
SLP Cancellation Note  Patient Details Name: Larry Ortega MRN: [id number] DOB: 1924-09-20   Cancelled treatment:       Reason Eval/Treat Not Completed: Medical issues which prohibited therapy. Pt intubated. Will f/u next date on extubation/ readiness for bedside swallow eval.    Kern Reap, MA, CCC-SLP 04/21/2016, 9:43 AM [phone]

## 2016-04-21 NOTE — Progress Notes (Signed)
VASCULAR LAB PRELIMINARY  PRELIMINARY  PRELIMINARY  PRELIMINARY  Carotid duplex completed.    Preliminary report:  R:40-59% Right ICA stenosis.  No significant change since study done 04/02/16.  Left: 1-39% ICA plaquing.    Aries Kasa, RVT 04/21/2016, 4:03 PM

## 2016-04-21 NOTE — Procedures (Signed)
Lumbar Puncture Procedure Note  Pre-operative Diagnosis: r/o encph, men  Post-operative Diagnosis: r/o enceph  Indications: Diagnostic  Procedure Details   Consent: Informed consent was obtained. Risks of the procedure were discussed including: infection, bleeding, pain and headache.  The patient was positioned under sterile conditions. Betadine solution and sterile drapes were utilized. A spinal needle was inserted at the L3 - L4 interspace.  Spinal fluid was obtained and sent to the laboratory.  Findings 85mL of clear spinal fluid was obtained. Opening Pressure: 12cm H2O pressure. Closing Pressure: 10cm H2O pressure.  Complications:  None; patient tolerated the procedure well.        Condition: stable  Plan Bed rest for 5 hours.  Lavon Paganini. Titus Mould, MD, [place] Pgr: [facility]

## 2016-04-21 NOTE — Progress Notes (Signed)
[facility] ADULT ICU REPLACEMENT PROTOCOL FOR AM LAB REPLACEMENT ONLY  The patient does apply for the [facility] Adult ICU Electrolyte Replacment Protocol based on the criteria listed below:   1. Is GFR >/= 40 ml/min? Yes.    Patient's GFR today is 48 2. Is urine output >/= 0.5 ml/kg/hr for the last 6 hours? Yes.   Patient's UOP is 0.9 ml/kg/hr 3. Is BUN < 60 mg/dL? Yes.    Patient's BUN today is 17 4. Abnormal electrolyte(s): K+3.4 5. Ordered repletion with: protocol 6. If a panic level lab has been reported, has the CCM MD in charge been notified? No..   Physician:  Nicanor Bake [facility] 04/21/2016 4:39 AM

## 2016-04-21 NOTE — Progress Notes (Addendum)
PULMONARY / CRITICAL CARE MEDICINE   Name: Larry Ortega MRN: [id number] DOB: [birth year]    ADMISSION DATE:  04/20/2016 CONSULTATION DATE:  04/20/16  REFERRING MD:  EDP  CHIEF COMPLAINT:  AMS, seizure, facial droop  HISTORY OF PRESENT ILLNESS:   Larry Ortega is a 82 year old male with a PMH of carotid stenosis (followed by Dr. Donnetta Hutching) who presented to the ED with confusion and facial droop. This morning around 10:30am, he started feeling dizzy and walked outside. His family noticed he was confused. He walked out to his car, sat inside, and would not get out. He started talking funny and was not making sense. Family called 911. Per family, he has never had a stroke before.  At baseline, Pt is very healthy and active. He mows the lawn, takes full care of himself, completes all ADLs independently, takes care of his finances, etc.  In the ED, code stroke was called. He was evaluated by Dr. Tasia Catchings (Neurology) who thought he likely had a L MCA stroke, given his aphasia and right facial droop. CT head showed diffuse atrophy and low density area in the right frontal region that appeared chronic in nature. Around 3:45pm, he had a witnessed seizure in the ED. He was given 1mg  Ativan with resolution of seizure. PCCM was called for admission due to concern that Pt was unable to protect his airway.  SUBJECTIVE:  No acute events overnight. Patient remains agitated and noncooperative during sedation vacation today.  REVIEW OF SYSTEMS:  Unable to obtain with intubation & sedation.  VITAL SIGNS: BP 108/61   Pulse (!) 52   Temp 97.5 F (36.4 C) (Oral)   Resp 14   Wt 142 lb 10.2 oz (64.7 kg)   SpO2 100%   BMI 19.35 kg/m   HEMODYNAMICS:    VENTILATOR SETTINGS: Vent Mode: PRVC FiO2 (%):  [30 %-100 %] 30 % Set Rate:  [14 bmp-16 bmp] 14 bmp Vt Set:  [620 mL] 620 mL PEEP:  [5 cmH20] 5 cmH20 Plateau Pressure:  [10 cmH20-14 cmH20] 13 cmH20  INTAKE / OUTPUT: I/O last 3 completed shifts: In:  158.8 [I.V.:158.8] Out: 1185 [Urine:1185]  PHYSICAL EXAMINATION: General: Elderly male. No distress. Family at bedside. Neuro:  Spontaneously moving all 4 extremities. Not following commands. Altered mentation despite sedation vacation. HEENT: Endotracheal tube in place. No scleral icterus. Moist mucous membranes. Cardiovascular:  Regular rhythm. No edema. Unable to appreciate JVD. Lungs: Clear bilaterally on auscultation. Symmetric chest wall rise on ventilator. Abdomen:  Soft. Nondistended. Normal bowel sounds. Musculoskeletal:  No joint deformity or effusion appreciated. Symmetric muscle bulk. Skin: Warm and dry. No rash on exposed skin.  LABS:  BMET  Recent Labs Lab 04/20/16 1410 04/20/16 1418 04/21/16 0330  NA 140 141 137  K 4.8 4.8 3.4*  CL 110 107 106  CO2 22  --  24  BUN 19 24* 17  CREATININE 1.47* 1.50* 1.28*  GLUCOSE 111* 108* 98    Electrolytes  Recent Labs Lab 04/20/16 1410 04/21/16 0330  CALCIUM 9.6 9.2  MG  --  1.9  PHOS  --  2.8    CBC  Recent Labs Lab 04/20/16 1410 04/20/16 1418 04/21/16 0330  WBC 9.5  --  11.8*  HGB 13.2 13.3 12.5*  HCT 40.1 39.0 37.7*  PLT 232  --  212    Coag's  Recent Labs Lab 04/20/16 1410  APTT 30  INR 0.98    Sepsis Markers No results for input(s): LATICACIDVEN, PROCALCITON, O2SATVEN in  the last 168 hours.  ABG  Recent Labs Lab 04/20/16 2002  PHART 7.416  PCO2ART 43.1  PO2ART 478.0*    Liver Enzymes  Recent Labs Lab 04/20/16 1410  AST 27  ALT 24  ALKPHOS 53  BILITOT 0.8  ALBUMIN 3.6    Cardiac Enzymes No results for input(s): TROPONINI, PROBNP in the last 168 hours.  Glucose  Recent Labs Lab 04/20/16 1558 04/20/16 1948 04/20/16 2311 04/21/16 0306 04/21/16 0733 04/21/16 1127  GLUCAP 157* 139* 95 89 78 80    STUDIES:  CT HEAD 7/29: No acute abnormality, previous right frontal lobe subcortical infarct, microvascular disease, atrophy. MRI/MRA BRAIN 7/29: Degraded by motion.  No acute intracranial abnormality. Moderate chronic small vessel ischemic disease. 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 No evidence of large vessel occlusion or flow-limiting proximal stenosis. Port CXR 7/30>>>  MICROBIOLOGY: MRSA PCR 7/29:  Negative   ANTIBIOTICS: None.  SIGNIFICANT EVENTS: 7/29 - Came into ED for stroke symptoms, witnessed seizure in ED, intubated for airway protection, admitted to [facility] service  LINES/TUBES: OETT 7.5 7/29 >>  OGT 7/29 >> Foley 7/29 >> PIV x2  ASSESSMENT / PLAN:  NEUROLOGIC A:   Seizure - Acute w/o history. No obvious CVA on MRI. Acute Encephalopathy - Multifactorial. Sedation on Ventilator  P:   Neurology Consulted & Following RASS goal: 0 to -1 AED per Neurology:  Keppra Ativan IV prn Seizure Propofol gtt Fentanyl IV prn Pain Seizure Precautions See ID Stat [id number], MMA, TSH, & Free T4 EEG pending  PULMONARY A: Acute Respiratory Failure - Unable to protect airway.  P:   Full vent support Stat Port CXR SBT & WUA as mental status allows  CARDIOVASCULAR A:  H/O HTN - Currently normotensive. Bilateral Carotid Artery Stenosis  P:  Continuous Telemetry Monitoring Vitals per Unit Protocol Continuing ASA 81mg  & Plavix 75mg  Repeat Carotid U/S to evaluate stenosis  RENAL A:   Hypokalemia - Replaced. Acute Renal Failure  - Improving.  P:   Trending UOP Monitoring electrolytes & renal function daily Replacing electrolytes as indicated. KCl 76mEq IV x2 runs  GASTROINTESTINAL A:   No acute issues.  P:   NPO Holding on Tube Feedings Pepcid IV q24hr  HEMATOLOGIC A:   Leukocytosis - Possibly due to sepsis.  P:  Trending cell counts daily w/ CBC Lovenox [place] daily SCDs  INFECTIOUS A:   Possible Meningitis  P:   Starting Empiric Vancomycin, Rocephin, Ampicillin, & Acyclovir LP today Urine Streptococcal Antigen Droplet precautions Trending Procalcitonin per algorithm  ENDOCRINE A:    Hyperglycemia - No h/o DM.  P:   Checking Hgb A1c SSI per Sensitive Algorithm  FAMILY  - Updates: Daughter & Wife updated at bedside by Dr. Ashok Cordia 7/30. Also discussed risks & benefits of LP for consent for the procedure.  - Inter-disciplinary family meet or Palliative Care meeting due by:  04/26/16  TODAY'S SUMMARY:  82 year old male with a PMH of carotid stenosis who presented to the ED with aphasia and right sided facial droop. Code stroke was called. Pt had a witnessed seizure in the ED, which resolved with Ativan. There was concern that he was unable to protect his airway because patient was intubated. Remains altered today with no identifiable cause for his seizure. MRI negative. Doubt ischemic event. Starting empiric antibiotics for meningitis & Dr. Titus Mould to perform lumbar puncture today. Appreciate assistance from neurology.  I have spent a total of 41 minutes of critical care time today caring for  the patient, discussing plan of care with family at bedside, and reviewing the patient's electronic medical record.  Sonia Baller Ashok Cordia, M.D. [facility] Pulmonary & Critical Care Pager:  [phone] After 3pm or if no response, call [phone] 1:40 PM 04/21/16

## 2016-04-21 NOTE — Progress Notes (Signed)
PT Cancellation Note  Patient Details Name: Larry Ortega MRN: [id number] DOB: 24-Feb-1924   Cancelled Treatment:    Reason Eval/Treat Not Completed: Medical issues which prohibited therapy;Patient not medically ready -- pt intubated.  PT will check medical status next date and proceed if/when appropriate. Thank you,    Herbie Drape 04/21/2016, 9:03 AM

## 2016-04-21 NOTE — Progress Notes (Signed)
Pharmacy Antibiotic Note  Larry Ortega is a 82 y.o. male admitted on 04/20/2016 with aphasia and R sided facial droop.  Starting empiric antibiotics for possible meningitis. Pt to undergo LP today. WBC 11.8, afebrile, SCr 1.2, eCrCl 30-35 ml/min.    Plan: -Vancomycin 1500 mg IV x1 then 750 mg IV q24h -Ceftriaxone 2 g IV q12h -Ampicillin 2 g IV q6h -Acyclovir 10 mg/kg q12h -Monitor renal fx, cultures, LP, VT at Css   Weight: 142 lb 10.2 oz (64.7 kg)  Temp (24hrs), Avg:97.5 F (36.4 C), Min:97.5 F (36.4 C), Max:97.7 F (36.5 C)   Recent Labs Lab 04/20/16 1410 04/20/16 1418 04/21/16 0330  WBC 9.5  --  11.8*  CREATININE 1.47* 1.50* 1.28*    Estimated Creatinine Clearance: 35.1 mL/min (by C-G formula based on SCr of 1.28 mg/dL).    No Known Allergies    Antimicrobials this admission: 7/30 vancomycin > 7/30 ceftriaxone > 7/30 ampicillin > 7/30 acyclovir >  Dose adjustments this admission: NA  Microbiology results: NA   Thank you for allowing pharmacy to be a part of this patient's care.  Harvel Quale 04/21/2016 3:28 PM

## 2016-04-21 NOTE — Progress Notes (Signed)
Patient transported to MRI on ventilator by this RT. Vitals stable.

## 2016-04-21 NOTE — Progress Notes (Signed)
Received carotid duplex order. Patient had carotid study done 04/02/16 showing Right ICA 40-59% stenosis and Left ICA <40% stenosis.   Please advise if repeat needed.  Landry Mellow, RDMS, RVT Vascular lab

## 2016-04-21 NOTE — Progress Notes (Signed)
Subjective:  Larry Ortega is a pleasant 82 year old gentleman who presented to the emergency room yesterday with a aphasia and a right facial droop. His CT revealed evidence of an old right frontal infarct that appears to be subclinical in nature. While in the emergency room he experienced a seizure. He subsequently experienced a second seizure and required intubation and admission to the intensive care unit. He is presently intubated and on propofol sedation precluding neurological evaluation this morning. He has received Keppra.    Exam: Vitals:   04/21/16 0750 04/21/16 0800  BP: 127/79   Pulse: (!) 46   Resp: 14   Temp:  97.5 F (36.4 C)    HEENT-  Normocephalic, no lesions, without obvious abnormality.  Normal external eye and conjunctiva.  Normal TM's bilaterally.  Normal auditory canals and external ears. Normal external nose, mucus membranes and septum.  Normal pharynx. Cardiovascular- regular rate and rhythm, S1, S2 normal, no murmur, click, rub or gallop, pulses palpable throughout   Lungs- chest clear, no wheezing, rales, normal symmetric air entry, Heart exam - S1, S2 normal, no murmur, no gallop, rate regular Abdomen- soft, non-tender; bowel sounds normal; no masses,  no organomegaly Extremities- less then 2 second capillary refill Lymph-no adenopathy palpable Musculoskeletal-no joint tenderness, deformity or swelling Skin-warm and dry, no hyperpigmentation, vitiligo, or suspicious lesions    Gen: In bed, NAD MS: Intubated and sedated on propofol.   Pertinent Labs/Diagnostics: Reviewed   Impression:   On further discussion with the family this morning, they report that initially he was found in his car and was quite rigid holding onto the steering wheel. It required significant effort to remove him from the vehicle. In retrospect, this may have represented seizure activity as part of his initial presentation.  This history was not available at the time of his admission.  It was suspected that he had experienced an ischemic event. However, the MRI revealed no new ischemic change. As such, it appears that his presentation was that of a post ictal state.  At this time it would be reasonable to consider weaning from the ventilator and decreasing the sedation to see what his baseline status is. If there is indication of ongoing seizure activity, EEG and long-term monitoring will be requested.  There is no prior history of seizures. The white count was noted to be mildly elevated today. Larry Ortega remains afebrile. It might also be reasonable to consider the option of lumbar puncture.   Recommendations:  1. Wean sedation towards extubation if possible.  2. Continue Keppra.  3. If there is evidence of ongoing seizure activity EEG and LTM will be requested.  4. Given the mildly elevated white blood cell count, and seizures of unknown etiology, lumbar puncture to rule out an infectious process may be a reasonable consideration.   Shiza Thelen A. Tasia Catchings, M.D. Neurohospitalist Phone: [phone]   04/21/2016, 9:54 AM

## 2016-04-22 ENCOUNTER — Inpatient Hospital Stay (HOSPITAL_COMMUNITY): Payer: Medicare Other

## 2016-04-22 DIAGNOSIS — G40409 Other generalized epilepsy and epileptic syndromes, not intractable, without status epilepticus: Secondary | ICD-10-CM | POA: Diagnosis not present

## 2016-04-22 DIAGNOSIS — R569 Unspecified convulsions: Secondary | ICD-10-CM

## 2016-04-22 LAB — CBC WITH DIFFERENTIAL/PLATELET
BASOS ABS: 0 10*3/uL (ref 0.0–0.1)
Basophils Relative: 0 %
EOS ABS: 0.2 10*3/uL (ref 0.0–0.7)
EOS PCT: 2 %
HCT: 39.4 % (ref 39.0–52.0)
HEMOGLOBIN: 13 g/dL (ref 13.0–17.0)
LYMPHS PCT: 11 %
Lymphs Abs: 1.3 10*3/uL (ref 0.7–4.0)
MCH: 30.7 pg (ref 26.0–34.0)
MCHC: 33 g/dL (ref 30.0–36.0)
MCV: 93.1 fL (ref 78.0–100.0)
Monocytes Absolute: 1 10*3/uL (ref 0.1–1.0)
Monocytes Relative: 8 %
NEUTROS PCT: 79 %
Neutro Abs: 9 10*3/uL — ABNORMAL HIGH (ref 1.7–7.7)
PLATELETS: 218 10*3/uL (ref 150–400)
RBC: 4.23 MIL/uL (ref 4.22–5.81)
RDW: 13.9 % (ref 11.5–15.5)
WBC: 11.4 10*3/uL — AB (ref 4.0–10.5)

## 2016-04-22 LAB — RPR: RPR: NONREACTIVE

## 2016-04-22 LAB — HERPES SIMPLEX VIRUS(HSV) DNA BY PCR
HSV 1 DNA: NEGATIVE
HSV 2 DNA: NEGATIVE

## 2016-04-22 LAB — RENAL FUNCTION PANEL
Albumin: 3.1 g/dL — ABNORMAL LOW (ref 3.5–5.0)
Anion gap: 7 (ref 5–15)
BUN: 14 mg/dL (ref 6–20)
CALCIUM: 8.6 mg/dL — AB (ref 8.9–10.3)
CO2: 24 mmol/L (ref 22–32)
CREATININE: 1.37 mg/dL — AB (ref 0.61–1.24)
Chloride: 108 mmol/L (ref 101–111)
GFR, EST AFRICAN AMERICAN: 51 mL/min — AB (ref >60)
GFR, EST NON AFRICAN AMERICAN: 44 mL/min — AB (ref >60)
Glucose, Bld: 125 mg/dL — ABNORMAL HIGH (ref 65–99)
PHOSPHORUS: 2.7 mg/dL (ref 2.5–4.6)
Potassium: 3.3 mmol/L — ABNORMAL LOW (ref 3.5–5.1)
SODIUM: 139 mmol/L (ref 135–145)

## 2016-04-22 LAB — GLUCOSE, CAPILLARY
GLUCOSE-CAPILLARY: 90 mg/dL (ref 65–99)
Glucose-Capillary: 131 mg/dL — ABNORMAL HIGH (ref 65–99)
Glucose-Capillary: 106 mg/dL — ABNORMAL HIGH (ref 65–99)
Glucose-Capillary: 126 mg/dL — ABNORMAL HIGH (ref 65–99)
Glucose-Capillary: 135 mg/dL — ABNORMAL HIGH (ref 65–99)

## 2016-04-22 LAB — PHOSPHORUS
PHOSPHORUS: 2.9 mg/dL (ref 2.5–4.6)
PHOSPHORUS: 2.6 mg/dL (ref 2.5–4.6)

## 2016-04-22 LAB — MAGNESIUM
MAGNESIUM: 1.9 mg/dL (ref 1.7–2.4)
MAGNESIUM: 2 mg/dL (ref 1.7–2.4)
MAGNESIUM: 1.9 mg/dL (ref 1.7–2.4)

## 2016-04-22 LAB — TRIGLYCERIDES: TRIGLYCERIDES: 208 mg/dL — AB (ref <150)

## 2016-04-22 LAB — PROCALCITONIN: Procalcitonin: <0.1 ng/mL

## 2016-04-22 LAB — HIV ANTIBODY (ROUTINE TESTING W REFLEX): HIV SCREEN 4TH GENERATION: NONREACTIVE

## 2016-04-22 MED ORDER — VITAL HIGH PROTEIN PO LIQD: 1000.0000 mL | ORAL | Status: DC

## 2016-04-22 MED ORDER — PRO-STAT SUGAR FREE PO LIQD
30.0000 mL | Freq: Two times a day (BID) | ORAL | Status: DC
  Administered 2016-04-22: 30 mL
  Filled 2016-04-22: qty 30

## 2016-04-22 MED ORDER — POTASSIUM CHLORIDE 20 MEQ/15ML (10%) PO SOLN
40.0000 meq | Freq: Once | ORAL | Status: AC
  Administered 2016-04-22: 40 meq
  Filled 2016-04-22: qty 30

## 2016-04-22 MED ORDER — VITAL HIGH PROTEIN PO LIQD
1000.0000 mL | ORAL | Status: DC
  Administered 2016-04-22: 1000 mL

## 2016-04-22 MED ORDER — POTASSIUM CHLORIDE 10 MEQ/100ML IV SOLN
10.0000 meq | INTRAVENOUS | Status: AC
  Administered 2016-04-22 (×2): 10 meq via INTRAVENOUS
  Filled 2016-04-22 (×2): qty 100

## 2016-04-22 NOTE — Progress Notes (Signed)
Subjective: Patient is intubated and not following commands. PCCM is attempting to wean off the vent but he becomes very agitated. EEG repeated this AM. Continues on Keppra.   Objective: Current vital signs: BP 115/63   Pulse 60   Temp 98 F (36.7 C) (Axillary)   Resp 14   Ht 6' (1.829 m)   Wt 64 kg (141 lb 1.5 oz)   SpO2 100%   BMI 19.14 kg/m  Vital signs in last 24 hours: Temp:  [97.5 F (36.4 C)-98 F (36.7 C)] 98 F (36.7 C) (07/31 0359) Pulse Rate:  [50-79] 60 (07/31 0800) Resp:  [13-17] 14 (07/31 0800) BP: (75-159)/(46-84) 115/63 (07/31 0913) SpO2:  [96 %-100 %] 100 % (07/31 0913) FiO2 (%):  [30 %] 30 % (07/31 0913) Weight:  [64 kg (141 lb 1.5 oz)] 64 kg (141 lb 1.5 oz) (07/31 0246)  Intake/Output from previous day: 07/30 0701 - 07/31 0700 In: 1588.2 [I.V.:420.3; IV Piggyback:1167.9] Out: 2155 [Urine:1855; Emesis/NG output:300] Intake/Output this shift: No intake/output data recorded. Nutritional status: Diet NPO time specified   Neurologic Exam: General: NAD Mental Status: Sedated on Propofol. Follows no commands.  Cranial Nerves: II:  Visual fields grossly normal, pupils equal, round, reactive to light and accommodation III,IV, VI: ptosis not present, extra-ocular motions intact bilaterally V,VII: face symmetric, VIII: hearing normal bilaterally IX,X: uvula rises symmetrically XI: bilateral shoulder shrug XII: midline tongue extension without atrophy or fasciculations  Motor: Sedated and not responding to noxious stimuli Sensory: no real response to pain Deep Tendon Reflexes:  Right: Upper Extremity   Left: Upper extremity   biceps (C-5 to C-6) 2/4   biceps (C-5 to C-6) 2/4 tricep (C7) 2/4    triceps (C7) 2/4 Brachioradialis (C6) 2/4  Brachioradialis (C6) 2/4  Lower Extremity Lower Extremity  quadriceps (L-2 to L-4) 2/4   quadriceps (L-2 to L-4) 2/4 Achilles (S1) 1/4   Achilles (S1) 1/4  Plantars: Right: downgoing   Left: downgoing   Lab  Results: Basic Metabolic Panel:  Recent Labs Lab 04/20/16 1410 04/20/16 1418 04/21/16 0330 04/22/16 0529  NA 140 141 137 139  K 4.8 4.8 3.4* 3.3*  CL 110 107 106 108  CO2 22  --  24 24  GLUCOSE 111* 108* 98 125*  BUN 19 24* 17 14  CREATININE 1.47* 1.50* 1.28* 1.37*  CALCIUM 9.6  --  9.2 8.6*  MG  --   --  1.9 1.9  PHOS  --   --  2.8 2.7    Liver Function Tests:  Recent Labs Lab 04/20/16 1410 04/22/16 0529  AST 27  --   ALT 24  --   ALKPHOS 53  --   BILITOT 0.8  --   PROT 6.2*  --   ALBUMIN 3.6 3.1*   No results for input(s): LIPASE, AMYLASE in the last 168 hours. No results for input(s): AMMONIA in the last 168 hours.  CBC:  Recent Labs Lab 04/20/16 1410 04/20/16 1418 04/21/16 0330 04/22/16 0529  WBC 9.5  --  11.8* 11.4*  NEUTROABS 5.9  --   --  9.0*  HGB 13.2 13.3 12.5* 13.0  HCT 40.1 39.0 37.7* 39.4  MCV 93.0  --  91.5 93.1  PLT 232  --  212 218    Cardiac Enzymes: No results for input(s): CKTOTAL, CKMB, CKMBINDEX, TROPONINI in the last 168 hours.  Lipid Panel:  Recent Labs Lab 04/20/16 2013 04/21/16 0330 04/22/16 0529  CHOL  --  109  --  TRIG 154* 115 208*  HDL  --  31*  --   CHOLHDL  --  3.5  --   VLDL  --  23  --   LDLCALC  --  55  --     CBG:  Recent Labs Lab 04/21/16 1525 04/21/16 1921 04/21/16 2305 04/22/16 0318 04/22/16 0809  GLUCAP 100* 119* 110* 131* 106*    Microbiology: Results for orders placed or performed during the hospital encounter of 04/20/16  MRSA PCR Screening     Status: None   Collection Time: 04/20/16  7:15 PM  Result Value Ref Range Status   MRSA by PCR NEGATIVE NEGATIVE Final    Comment:        The GeneXpert MRSA Assay (FDA approved for NASAL specimens only), is one component of a comprehensive MRSA colonization surveillance program. It is not intended to diagnose MRSA infection nor to guide or monitor treatment for MRSA infections.   CSF culture with Stat gram stain     Status: None  (Preliminary result)   Collection Time: 04/21/16  2:11 PM  Result Value Ref Range Status   Specimen Description CSF  Final   Special Requests Normal  Final   Gram Stain   Final    WBC PRESENT,BOTH PMN AND MONONUCLEAR NO ORGANISMS SEEN CYTOSPIN SMEAR    Culture PENDING  Incomplete   Report Status PENDING  Incomplete    Coagulation Studies:  Recent Labs  04/20/16 1410  LABPROT 12.9  INR 0.98    Imaging: Mr Virgel Paling HY Contrast  Result Date: 04/20/2016 CLINICAL DATA:  Aphasia and right facial droop. Seizure. Confusion. EXAM: MRI HEAD WITHOUT CONTRAST MRA HEAD WITHOUT CONTRAST TECHNIQUE: Multiplanar, multiecho pulse sequences of the brain and surrounding structures were obtained without intravenous contrast. Angiographic images of the head were obtained using MRA technique without contrast. COMPARISON:  Head CT 04/20/2016 and MRA 07/20/2015 FINDINGS: MRI HEAD FINDINGS The study is mildly motion degraded. There is a punctate focus of mildly increased trace diffusion signal in the posterior limb of the left internal capsule on the axial diffusion series, however restricted diffusion is not confirmed on the standard coronal or thin-section coronal diffusion sequences in this is felt to be artifactual. There is no evidence of acute infarct elsewhere. There is mild to moderate cerebral atrophy. A small, chronic right frontal lobe cortical infarct is again noted. Patchy T2 hyperintensities throughout the cerebral white matter bilaterally are nonspecific but compatible with moderate chronic small vessel ischemic disease. There is a tiny, chronic infarct in the right cerebellum. There is no evidence of intracranial hemorrhage, mass, midline shift, or extra-axial fluid collection. Prior bilateral cataract extraction is noted. Minimal mucosal thickening is present in the paranasal sinuses. An endotracheal tube is in place with fluid in the pharynx. Mastoid air cells are clear. Major intracranial vascular  flow voids are preserved. MRA HEAD FINDINGS The study is moderately motion degraded. The visualized distal vertebral arteries are patent to the basilar and similar in size. Left PICA origin is patent. Dominant right AICA. SCA origins are patent. Basilar artery is patent without stenosis. There is a patent right posterior communicating artery. PCAs are patent without evidence of significant proximal stenosis. Intracranial internal carotid arteries appear patent. No definite ICA stenosis is identified, however the there is signal loss at the level of the anterior genu, much greater on the left though in a similar configuration compared to the prior MRA. This is felt to be artifactual, however underlying stenosis is not  excluded. M1 and A1 segments are patent without evidence of flow limiting stenosis. The proximal ACA and MCA branch vessels are grossly patent, however evaluation is limited by motion artifact. No sizable intracranial aneurysm is identified. IMPRESSION: 1. No acute intracranial abnormality identified. 2. Moderate chronic small vessel ischemic disease. 3. 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 4. Motion degraded head MRA without evidence of large vessel occlusion or flow limiting proximal stenosis. Electronically Signed   By: Logan Bores M.D.   On: 04/20/2016 19:36  Mr Brain Wo Contrast  Result Date: 04/20/2016 CLINICAL DATA:  Aphasia and right facial droop. Seizure. Confusion. EXAM: MRI HEAD WITHOUT CONTRAST MRA HEAD WITHOUT CONTRAST TECHNIQUE: Multiplanar, multiecho pulse sequences of the brain and surrounding structures were obtained without intravenous contrast. Angiographic images of the head were obtained using MRA technique without contrast. COMPARISON:  Head CT 04/20/2016 and MRA 07/20/2015 FINDINGS: MRI HEAD FINDINGS The study is mildly motion degraded. There is a punctate focus of mildly increased trace diffusion signal in the posterior limb of the left internal capsule on  the axial diffusion series, however restricted diffusion is not confirmed on the standard coronal or thin-section coronal diffusion sequences in this is felt to be artifactual. There is no evidence of acute infarct elsewhere. There is mild to moderate cerebral atrophy. A small, chronic right frontal lobe cortical infarct is again noted. Patchy T2 hyperintensities throughout the cerebral white matter bilaterally are nonspecific but compatible with moderate chronic small vessel ischemic disease. There is a tiny, chronic infarct in the right cerebellum. There is no evidence of intracranial hemorrhage, mass, midline shift, or extra-axial fluid collection. Prior bilateral cataract extraction is noted. Minimal mucosal thickening is present in the paranasal sinuses. An endotracheal tube is in place with fluid in the pharynx. Mastoid air cells are clear. Major intracranial vascular flow voids are preserved. MRA HEAD FINDINGS The study is moderately motion degraded. The visualized distal vertebral arteries are patent to the basilar and similar in size. Left PICA origin is patent. Dominant right AICA. SCA origins are patent. Basilar artery is patent without stenosis. There is a patent right posterior communicating artery. PCAs are patent without evidence of significant proximal stenosis. Intracranial internal carotid arteries appear patent. No definite ICA stenosis is identified, however the there is signal loss at the level of the anterior genu, much greater on the left though in a similar configuration compared to the prior MRA. This is felt to be artifactual, however underlying stenosis is not excluded. M1 and A1 segments are patent without evidence of flow limiting stenosis. The proximal ACA and MCA branch vessels are grossly patent, however evaluation is limited by motion artifact. No sizable intracranial aneurysm is identified. IMPRESSION: 1. No acute intracranial abnormality identified. 2. Moderate chronic small vessel  ischemic disease. 3. 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 4. Motion degraded head MRA without evidence of large vessel occlusion or flow limiting proximal stenosis. Electronically Signed   By: Logan Bores M.D.   On: 04/20/2016 19:36  Dg Chest Port 1 View  Result Date: 04/21/2016 CLINICAL DATA:  Acute respiratory failure.  History of CHF. EXAM: PORTABLE CHEST 1 VIEW COMPARISON:  None. FINDINGS: Endotracheal tube appears well positioned with tip approximately 4 cm above the carina. Enteric tube is in the stomach. Heart size is normal. Atherosclerotic changes noted at the aortic arch. Lungs are clear. No pleural effusion or pneumothorax seen. Osseous structures about the chest are unremarkable. IMPRESSION: 1. No evidence of acute cardiopulmonary abnormality. Lungs  are clear. Endotracheal tube well positioned with tip approximately 4 cm above the carina. 2. Aortic atherosclerosis. Electronically Signed   By: Franki Cabot M.D.   On: 04/21/2016 16:25  Ct Head Code Stroke Wo Contrast  Result Date: 04/20/2016 CLINICAL DATA:  Code stroke. Right-sided facial droop. Last seen normal at 11 a.m. EXAM: CT HEAD WITHOUT CONTRAST TECHNIQUE: Contiguous axial images were obtained from the base of the skull through the vertex without intravenous contrast. COMPARISON:  None. FINDINGS: Examination is degraded due to patient motion is staying the acquisition of additional images. Advanced atrophy with sulcal prominence. Old lacunar infarcts within the bilateral insular cortices, left greater than right. Scattered periventricular hypodensities compatible microvascular ischemic disease. Old subcortical infarct involving the right frontal lobe (image 22, series 301). Given background parenchymal abnormalities, there is no CT evidence superimposed acute large territory infarct. No intraparenchymal or extra-axial mass or hemorrhage. Normal configuration of the ventricles and basilar cisterns. No midline shift.  Intracranial atherosclerosis. Limited visualization the paranasal sinuses and mastoid air cells is normal. No air-fluid levels. Regional soft tissues appear normal. No radiopaque foreign body. Post bilateral cataract surgery. No displaced calvarial fracture. IMPRESSION: 1. No definite acute intracranial process. 2. Similar findings of prior right frontal lobe subcortical infarct, microvascular disease and atrophy as detailed above. Critical Value/emergent results were called by telephone at the time of interpretation on 04/20/2016 at 2:25 pm to Dr. Ezequiel Essex , who verbally acknowledged these results. Electronically Signed   By: Sandi Mariscal M.D.   On: 04/20/2016 14:32   Medications:  Scheduled: . acyclovir  10 mg/kg Intravenous Q12H  . antiseptic oral rinse  7 mL Mouth Rinse 10 times per day  . aspirin  81 mg Per Tube Daily  . chlorhexidine gluconate (SAGE KIT)  15 mL Mouth Rinse BID  . clopidogrel  75 mg Per Tube Daily  . enoxaparin (LOVENOX) injection  40 mg Subcutaneous Q24H  . famotidine (PEPCID) IV  20 mg Intravenous Q24H  . insulin aspart  0-9 Units Subcutaneous Q4H  . levETIRAcetam  500 mg Intravenous Q12H  . pneumococcal 23 valent vaccine  0.5 mL Intramuscular Tomorrow-1000  . potassium chloride  40 mEq Per Tube Once    Assessment/Plan:  Possible seizure in setting of encephalopathy/encephalitis. CSF with no significant pleocytosis and HSV pending. EEG repeated this AM with reading pending. Description of previous jerking is of right arm jerks but not rhythmic and family states bilateral arm jerking. Still unclear if true seizure but will repeat EEG and make decision on AED at that time.   Dr Shon Hale to attest note.    Etta Quill PA-C [facility] [phone]  04/22/2016, 10:18 AM    Neurology Attending Addendum  This patient was seen, examined, and d/w PA. I have reviewed the note and agree with the findings, assessment and plan as documented with the following  additions.   Chart has been reviewed at length. In brief, this is a 82 year old man who had initially presented with some aphasia and facial droop with concern for acute stroke. In the emergency department, however, he had witnessed generalized tonic clonic seizure activity. Imaging including MRI scan of the brain did not reveal any evidence of an acute ischemic stroke or other acute intracranial abnormality. Further history provided by the patient's family suggests that on the day of admission, he had become acutely confused and walked out of the house to sit in his car. He was verbally unresponsive during this time and his family had  difficulty getting him out of the car. He was intubated in the emergency department for airway protection and remains intubated in the ICU. He has not had any further seizure activity reported.  Physical exam: General: This is a well-developed Caucasian man who appears to be younger than his stated age. He is currently intubated and sedated. He does not open his eyes to voice. He does not follow commands at the time of my examination. Cranial nerves: Pupils are 2 mm and sluggish. Eyes are conjugate. Oculocephalics are intact. Corneals are present bilaterally. He has incomplete closure of the right eye but family reports that this is chronic and unchanged. The lower portion of his face that scattered by tubes and tape. Motor: Tone is mildly diminished but symmetric. No spontaneous movements were observed. He does not follow commands for confrontational strength testing. No abnormal movements or seizures are seen. Sensation: He withdraws from noxious stimuli 4. DTRs: 2+, symmetric. Toes are mute.  Diagnostic studies: EEG today was performed while he was on sedation because attempts to wean sedation led to significant agitation. This showed diffuse slowing consistent with his sedation, no evidence of seizures.  I have personally and independently reviewed the MRI scan of  the brain without contrast from 04/20/16. There is no restricted diffusion to suggest acute ischemia. He has mild diffuse generalized atrophy. Chronic small vessel ischemic changes are noted throughout the bihemispheric white matter. He has evidence of a chronic infarction in the right frontal lobe with another in the right cerebellum.  I have personally and independently reviewed the MRA of the head from 04/20/16. This is somewhat limited by patient motion. There is no obvious flow limiting stenosis in the intracranial vessels.  Impression: 1. 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 This represents first-time seizure activity in this patient. The most commonly identified etiology for new onset seizures in the elderly population is cerebrovascular disease, including chronic ischemia. His MRI certainly shows evidence of chronic ischemic change, making this the likely cause in his case. LP is unrevealing with isolated elevation of protein which is a nonspecific finding. He remains on acyclovir pending the return of herpes PCR on the spinal fluid but herpes encephalitis appears unlikely given normal MRI scan and abrupt onset of symptoms. Continue Keppra. Continue seizure precautions.  I met with the patient's family at the bedside including wife, daughter, and son-in-law. I had a long discussion with them about his seizures and their likely etiology as outlined above. We discussed his seizure medication and the fact that he will require medication for the rest of his life. I also discussed seizure precautions including no driving. These will of course be reiterated once the patient is able to participate in this conversation. They're all in agreement with the plan as stated. They were given the opportunity to ask any questions and these were addressed to their satisfaction.  A total of 45 minutes was spent on this patient,  including extensive review of the chart and discussion with the patient's family at the bedside.

## 2016-04-22 NOTE — Progress Notes (Signed)
EEG completed; results pending.    

## 2016-04-22 NOTE — Care Management Note (Signed)
Case Management Note  Patient Details  Name: Oran Dillenburg MRN: [id number] Date of Birth: 1924-06-18  Subjective/Objective: Pt admitted on 04/20/16 with ? Seizure activity/encephalopathy.  PTA, pt independent, lives with wife of 84 years.                    Action/Plan: Met with pt's wife and daughter at bedside.  Pt remains sedated on ventilator.  Will follow for discharge planning as pt progresses.    Expected Discharge Date:                  Expected Discharge Plan:  [place]  In-House Referral:     Discharge planning Services  CM Consult  Post Acute Care Choice:    Choice offered to:     DME Arranged:    DME Agency:     HH Arranged:    [place] Agency:     Status of Service:  In process, will continue to follow  If discussed at Long Length of Stay Meetings, dates discussed:    Additional Comments:  Reinaldo Raddle, RN, BSN  Trauma/Neuro ICU Case Manager [phone]

## 2016-04-22 NOTE — Progress Notes (Signed)
Initial Nutrition Assessment  DOCUMENTATION CODES:   Not applicable  INTERVENTION:   Initiate TF via OGT with Vital High Protein at goal rate of 55 ml/h (1320 ml per day) to provide 1320 kcals, 115 gm protein, 1104 ml free water daily.  Regimen provides 1423 kcal daily with inclusion of current propofol rate. Regimen will meet 97% of estimated kcal needs and 100% of protein needs.  NUTRITION DIAGNOSIS:   Inadequate oral intake related to inability to eat as evidenced by NPO status.  GOAL:   Patient will meet greater than or equal to 90% of their needs  MONITOR:   Vent status, Labs, TF tolerance, Skin, I & O's  REASON FOR ASSESSMENT:   Ventilator, Consult Enteral/tube feeding initiation and management  ASSESSMENT:   Larry Ortega is a 82 year old male with a PMH of carotid stenosis who presented to the ED with aphasia and right sided facial droop. Code stroke was called. Pt had a witnessed seizure in the ED, which resolved with Ativan. There was concern that he was unable to protect his airway. PCCM was called for admission and Pt was intubated.  Pt admitted with aphasia and rt sided facial droop. Pt experienced seizure in ED.   Patient is currently intubated on ventilator support. OGT in place.  MV: 8.5 L/min Temp (24hrs), Avg:97.8 F (36.6 C), Min:97.6 F (36.4 C), Max:98 F (36.7 C)  Propofol: 3.9 ml/hr (provides 103 kcals daily)  Spoke with pt's daughter and son-in-law at bedside. They reports that pt has a very hearty appetite and eats well. They deny any weight loss and suspect pt has gained weight.  He was very active PTA- went to gym daily and did all of his housework and ADLs.   Nutrition-Focused physical exam completed. Findings are no fat depletion, no muscle depletion, and no edema.   Case discussed with RN. Plan to start feeding as soon as pump is available. She shares that pt has been very agitated and was previously receiving high doses of propofol. Pt  will likely need propofol until extubation due to frequent agitation.   Labs reviewed: K: 3.3, CBGS: 90-131.   Diet Order:  Diet NPO time specified  Skin:  Reviewed, no issues  Last BM:  PTA  Height:   Ht Readings from Last 1 Encounters:  04/20/16 6' (1.829 m)    Weight:   Wt Readings from Last 1 Encounters:  04/22/16 141 lb 1.5 oz (64 kg)    Ideal Body Weight:  80.9 kg  BMI:  Body mass index is 19.14 kg/m.  Estimated Nutritional Needs:   Kcal:  1471.3  Protein:  100-115 grams  Fluid:  >1.4 L  EDUCATION NEEDS:   No education needs identified at this time  Rosalee Tolley A. Jimmye Norman, RD, LDN, CDE Pager: [phone] After hours Pager: [phone]

## 2016-04-22 NOTE — Procedures (Signed)
ELECTROENCEPHALOGRAM REPORT  Date of Study: 04/22/2016  Patient's Name: Larry Ortega MRN: [id number] Date of Birth: [birth year]  Referring Provider: Dr. Hyman Bible  Clinical History: This is a 82 year old man with aphasia and a right facial droop who had 2 witnessed seizures in the ED.   Medications: propofol (DIPRIVAN) 1000 MG/100ML infusion 0.9 % sodium chloride infusion  levETIRAcetam (KEPPRA) 500 mg in sodium chloride 0.9 % 100 mL IVPB  ampicillin (OMNIPEN) 2 g in sodium chloride 0.9 % 50 mL IVPB  aspirin chewable tablet 81 mg  cefTRIAXone (ROCEPHIN) 2 g in dextrose 5 % 50 mL IVPB  clopidogrel (PLAVIX) tablet 75 mg  enoxaparin (LOVENOX) injection 40 mg  famotidine (PEPCID) IVPB 20 mg premix  vancomycin (VANCOCIN) IVPB 750 mg/150 ml premix  Technical Summary: A multichannel digital EEG recording measured by the international 10-20 system with electrodes applied with paste and impedances below 5000 ohms performed as portable with EKG monitoring in an intubated and sedated patient.  Hyperventilation and photic stimulation were not performed.  The digital EEG was referentially recorded, reformatted, and digitally filtered in a variety of bipolar and referential montages for optimal display.   Description: The patient is intubated and sedated on Propofol during the recording.  There is no clear posterior dominant rhythm. The background consists of a large amount of diffuse 4-5 Hz theta and 2-3 Hz delta slowing with diffuse bursts of beta activity. Normal sleep architecture is not seen. There is no reactivity to noxious stimulation.  Hyperventilation and photic stimulation were not performed. There were no epileptiform discharges or electrographic seizures seen.    EKG lead showed sinus bradycardia.  Impression: This sedated EEG is abnormal due to diffuse slowing of the background.  Clinical Correlation of the above findings indicates diffuse cerebral dysfunction that is non-specific  in etiology and can be seen with hypoxic/ischemic injury, toxic/metabolic encephalopathies, or medication effect from Propofol. If further clinical questions remain, a repeat EEG off sedation may be helpful. Clinical correlation is advised.   Ellouise Newer, M.D.

## 2016-04-22 NOTE — Progress Notes (Signed)
PULMONARY / CRITICAL CARE MEDICINE   Name: Gio Locke MRN: [id number] DOB: 07-07-1924    ADMISSION DATE:  04/20/2016 CONSULTATION DATE:  04/20/16  REFERRING MD:  EDP  CHIEF COMPLAINT:  AMS, seizure, facial droop  HISTORY OF PRESENT ILLNESS:   Doak Shrock is a 82 year old male with a PMH of carotid stenosis (followed by Dr. Donnetta Hutching) who presented to the ED with confusion and facial droop. Seizures, mri neg, vent  SUBJECTIVE:  LP done, not following comands   VITAL SIGNS: BP 115/63   Pulse 60   Temp 98 F (36.7 C) (Axillary)   Resp 14   Ht 6' (1.829 m)   Wt 64 kg (141 lb 1.5 oz)   SpO2 100%   BMI 19.14 kg/m   HEMODYNAMICS:    VENTILATOR SETTINGS: Vent Mode: PRVC FiO2 (%):  [30 %] 30 % Set Rate:  [14 bmp] 14 bmp Vt Set:  [620 mL] 620 mL PEEP:  [5 cmH20] 5 cmH20 Plateau Pressure:  [12 cmH20-14 cmH20] 12 cmH20  INTAKE / OUTPUT: I/O last 3 completed shifts: In: 1729.7 [I.V.:561.8; IV Piggyback:1167.9] Out: 3065 [Urine:2765; Emesis/NG output:300]  PHYSICAL EXAMINATION: General: Elderly male. No distress. Neuro:  Spontaneously moving all 4 extremities. Not following commands. agitated  HEENT: Endotracheal tube in place. No scleral icterus Cardiovascular:  s1 s2 RRR Lungs: CTA anterior, scattered ronchi Abdomen:  Soft. Nondistended. Normal bowel sounds. Musculoskeletal:  No edema Skin: Warm and dry. No rash on exposed skin.  LABS:  BMET  Recent Labs Lab 04/20/16 1410 04/20/16 1418 04/21/16 0330 04/22/16 0529  NA 140 141 137 139  K 4.8 4.8 3.4* 3.3*  CL 110 107 106 108  CO2 22  --  24 24  BUN 19 24* 17 14  CREATININE 1.47* 1.50* 1.28* 1.37*  GLUCOSE 111* 108* 98 125*    Electrolytes  Recent Labs Lab 04/20/16 1410 04/21/16 0330 04/22/16 0529  CALCIUM 9.6 9.2 8.6*  MG  --  1.9 1.9  PHOS  --  2.8 2.7    CBC  Recent Labs Lab 04/20/16 1410 04/20/16 1418 04/21/16 0330 04/22/16 0529  WBC 9.5  --  11.8* 11.4*  HGB 13.2 13.3 12.5* 13.0   HCT 40.1 39.0 37.7* 39.4  PLT 232  --  212 218    Coag's  Recent Labs Lab 04/20/16 1410  APTT 30  INR 0.98    Sepsis Markers  Recent Labs Lab 04/21/16 1502 04/22/16 0529  PROCALCITON <0.10 <0.10    ABG  Recent Labs Lab 04/20/16 2002  PHART 7.416  PCO2ART 43.1  PO2ART 478.0*    Liver Enzymes  Recent Labs Lab 04/20/16 1410 04/22/16 0529  AST 27  --   ALT 24  --   ALKPHOS 53  --   BILITOT 0.8  --   ALBUMIN 3.6 3.1*    Cardiac Enzymes No results for input(s): TROPONINI, PROBNP in the last 168 hours.  Glucose  Recent Labs Lab 04/21/16 1127 04/21/16 1525 04/21/16 1921 04/21/16 2305 04/22/16 0318 04/22/16 0809  GLUCAP 80 100* 119* 110* 131* 106*    STUDIES:  CT HEAD 7/29: No acute abnormality, previous right frontal lobe subcortical infarct, microvascular disease, atrophy. MRI/MRA BRAIN 7/29: Degraded by motion. No acute intracranial abnormality. Moderate chronic small vessel ischemic disease. 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 No evidence of large vessel occlusion or flow-limiting proximal stenosis. LP 7/30>>>rbc 37, wbc 1, glu 62, prot 66, opening pressure 12  MICROBIOLOGY: MRSA PCR 7/29:  Negative  ANTIBIOTICS: None.  SIGNIFICANT EVENTS: 7/29 - Came into ED for stroke symptoms, witnessed seizure in ED, intubated for airway protection, admitted to [facility] service 7/30 LP  LINES/TUBES: OETT 7.5 7/29 >>  OGT 7/29 >> Foley 7/29 >> PIV x2  ASSESSMENT / PLAN:  NEUROLOGIC A:   Seizure - Acute w/o history. No obvious CVA on MRI. Acute Encephalopathy - Multifactorial. Sedation on Ventilator  P:   Neurology Consulted & Following RASS goal: 0 to -1 AED per Neurology:  Keppra Ativan IV prn Seizure Propofol gtt - WUA mandatory Fentanyl IV prn Pain Seizure Precautions See ID Stat [id number], MMA, TSH- wnl EEG pending, now being perfromed  PULMONARY A: Acute Respiratory Failure - Unable to protect airway.  P:   Need sbt,  cpap 5 ps 5, goal 30 min Unclear if his neuro status meets criteria for extubation, will need to d/w family reintubation status Assess airway protection skills  CARDIOVASCULAR A:  H/O HTN - Currently normotensive. Bilateral Carotid Artery Stenosis  P:  Continuous Telemetry Monitoring Vitals per Unit Protocol Continuing ASA 81mg  & Plavix 75mg  Repeat Carotid U/S to evaluate stenosis - unchanged from Dr Early  office  RENAL A:   Hypokalemia - Replaced. Acute Renal Failure  - Improving.  P:   bmet in am  KCl   GASTROINTESTINAL A:   No acute issues.  P:   NPO Holding on Tube Feedings, if not extubated today will feed Pepcid IV q24hr  HEMATOLOGIC A:   Leukocytosis - Possibly due to sepsis.  P:  Trending cell counts daily w/ CBC Lovenox [place] daily SCDs  INFECTIOUS A:   No evidence Meningitis R/o enceoph Agitation  P:   Continued acyclovir, follow hsv Dc al other abx Interrupt prop Dc droplet  ENDOCRINE A:   Hyperglycemia - No h/o DM.  P:   SSI per Sensitive Algorithm TSH wnl  FAMILY  - Updates: I updated entire family in room  - Inter-disciplinary family meet or Palliative Care meeting due by:  04/26/16  Ccm time 30 min   Lavon Paganini. Titus Mould, MD, [place] Pgr: [facility]

## 2016-04-23 ENCOUNTER — Other Ambulatory Visit (HOSPITAL_COMMUNITY): Payer: Medicare Other

## 2016-04-23 LAB — BASIC METABOLIC PANEL
ANION GAP: 5 (ref 5–15)
Anion gap: 6 (ref 5–15)
BUN: 19 mg/dL (ref 6–20)
BUN: 14 mg/dL (ref 6–20)
CHLORIDE: 111 mmol/L (ref 101–111)
CO2: 22 mmol/L (ref 22–32)
CO2: 23 mmol/L (ref 22–32)
Calcium: 8.2 mg/dL — ABNORMAL LOW (ref 8.9–10.3)
Calcium: 8.4 mg/dL — ABNORMAL LOW (ref 8.9–10.3)
Chloride: 112 mmol/L — ABNORMAL HIGH (ref 101–111)
Creatinine, Ser: 1.34 mg/dL — ABNORMAL HIGH (ref 0.61–1.24)
Creatinine, Ser: 1.26 mg/dL — ABNORMAL HIGH (ref 0.61–1.24)
GFR calc Af Amer: 52 mL/min — ABNORMAL LOW (ref >60)
GFR calc non Af Amer: 45 mL/min — ABNORMAL LOW (ref >60)
GFR, EST AFRICAN AMERICAN: 56 mL/min — AB (ref >60)
GFR, EST NON AFRICAN AMERICAN: 48 mL/min — AB (ref >60)
Glucose, Bld: 151 mg/dL — ABNORMAL HIGH (ref 65–99)
Glucose, Bld: 109 mg/dL — ABNORMAL HIGH (ref 65–99)
POTASSIUM: 3.6 mmol/L (ref 3.5–5.1)
POTASSIUM: 3.5 mmol/L (ref 3.5–5.1)
SODIUM: 139 mmol/L (ref 135–145)
SODIUM: 140 mmol/L (ref 135–145)

## 2016-04-23 LAB — GLUCOSE, CAPILLARY
GLUCOSE-CAPILLARY: 122 mg/dL — AB (ref 65–99)
GLUCOSE-CAPILLARY: 112 mg/dL — AB (ref 65–99)
GLUCOSE-CAPILLARY: 109 mg/dL — AB (ref 65–99)
Glucose-Capillary: 100 mg/dL — ABNORMAL HIGH (ref 65–99)
Glucose-Capillary: 108 mg/dL — ABNORMAL HIGH (ref 65–99)
Glucose-Capillary: 135 mg/dL — ABNORMAL HIGH (ref 65–99)
Glucose-Capillary: 159 mg/dL — ABNORMAL HIGH (ref 65–99)

## 2016-04-23 LAB — PHOSPHORUS
Phosphorus: 3.3 mg/dL (ref 2.5–4.6)
Phosphorus: 3 mg/dL (ref 2.5–4.6)

## 2016-04-23 LAB — VAS US CAROTID
LCCAPDIAS: 21 cm/s
LCCAPSYS: 85 cm/s
LEFT ECA DIAS: -3 cm/s
LICADDIAS: -8 cm/s
LICAPDIAS: -8 cm/s
Left CCA dist dias: -11 cm/s
Left CCA dist sys: -68 cm/s
Left ICA dist sys: -47 cm/s
Left ICA prox sys: -58 cm/s
RCCADSYS: -64 cm/s
RCCAPDIAS: 8 cm/s
RIGHT ECA DIAS: -8 cm/s
RIGHT VERTEBRAL DIAS: -4 cm/s
Right CCA prox sys: 76 cm/s

## 2016-04-23 LAB — ACID FAST SMEAR (AFB): ACID FAST SMEAR - AFSCU2: NEGATIVE

## 2016-04-23 LAB — MAGNESIUM
MAGNESIUM: 1.8 mg/dL (ref 1.7–2.4)
Magnesium: 1.8 mg/dL (ref 1.7–2.4)

## 2016-04-23 LAB — ACID FAST SMEAR (AFB, MYCOBACTERIA)

## 2016-04-23 LAB — TRIGLYCERIDES: Triglycerides: 203 mg/dL — ABNORMAL HIGH (ref <150)

## 2016-04-23 LAB — PROCALCITONIN: Procalcitonin: <0.1 ng/mL

## 2016-04-23 MED ORDER — ANTISEPTIC ORAL RINSE SOLUTION (CORINZ)
7.0000 mL | OROMUCOSAL | Status: DC
  Administered 2016-04-23 – 2016-04-24 (×7): 7 mL via OROMUCOSAL

## 2016-04-23 MED ORDER — SODIUM CHLORIDE 0.9 % IV SOLN
Freq: Once | INTRAVENOUS | Status: AC
  Administered 2016-04-23: 06:00:00 via INTRAVENOUS

## 2016-04-23 NOTE — Procedures (Signed)
Extubation Procedure Note  Patient Details:   Name: Dontavis Feiock DOB: [birth year] MRN: [id number]   Airway Documentation:    Pt extubated to 4lpm , no distress noted, positive for cuff leak.  Evaluation  O2 sats: stable throughout Complications: No apparent complications Patient did tolerate procedure well. Bilateral Breath Sounds: Rhonchi   No  Ariyonna Twichell Wyatt Haste 04/23/2016, 11:49 AM

## 2016-04-23 NOTE — Progress Notes (Signed)
PULMONARY / CRITICAL CARE MEDICINE   Name: Larry Ortega MRN: [id number] DOB: 1923-11-13    ADMISSION DATE:  04/20/2016 CONSULTATION DATE:  04/20/16  REFERRING MD:  EDP  CHIEF COMPLAINT:  AMS, seizure, facial droop  HISTORY OF PRESENT ILLNESS:   Larry Ortega is a 82 year old male with a PMH of carotid stenosis (followed by Dr. Donnetta Hutching) who presented to the ED with confusion and facial droop. Seizures, mri neg, vent  SUBJECTIVE: not following commands, low output   VITAL SIGNS: BP 98/62   Pulse 64   Temp 98.1 F (36.7 C) (Axillary)   Resp 18   Ht 6' (1.829 m)   Wt 149 lb 4 oz (67.7 kg)   SpO2 99%   BMI 20.24 kg/m   HEMODYNAMICS:    VENTILATOR SETTINGS: Vent Mode: PSV;CPAP FiO2 (%):  [30 %] 30 % Set Rate:  [14 bmp] 14 bmp Vt Set:  [620 mL] 620 mL PEEP:  [5 cmH20] 5 cmH20 Pressure Support:  [8 cmH20] 8 cmH20 Plateau Pressure:  [11 cmH20-17 cmH20] 13 cmH20  INTAKE / OUTPUT: I/O last 3 completed shifts: In: 2343.6 [I.V.:1149.9; NG/GT:40; IV Piggyback:1153.7] Out: 2215 [Urine:1265; Emesis/NG output:100; Other:850]  PHYSICAL EXAMINATION: General: Elderly male. Intubated and sedated. Neuro:  Spontaneously moving all 4 extremities. Not following commands.   HEENT: Endotracheal tube in place. No scleral icterus Cardiovascular:  s1 s2 RRR Lungs: CTA anterior, scattered ronchi Abdomen:  Soft. Nondistended. Normal bowel sounds. Musculoskeletal:  No edema Skin: Warm and dry. No rash on exposed skin.  LABS:  BMET  Recent Labs Lab 04/21/16 0330 04/22/16 0529 04/23/16 0503  NA 137 139 139  K 3.4* 3.3* 3.6  CL 106 108 111  CO2 24 24 22   BUN 17 14 19   CREATININE 1.28* 1.37* 1.34*  GLUCOSE 98 125* 151*    Electrolytes  Recent Labs Lab 04/21/16 0330 04/22/16 0529 04/22/16 1457 04/22/16 1817 04/23/16 0503  CALCIUM 9.2 8.6*  --   --  8.2*  MG 1.9 1.9 2.0 1.9 1.8  PHOS 2.8 2.7 2.9 2.6 3.3    CBC  Recent Labs Lab 04/20/16 1410 04/20/16 1418  04/21/16 0330 04/22/16 0529  WBC 9.5  --  11.8* 11.4*  HGB 13.2 13.3 12.5* 13.0  HCT 40.1 39.0 37.7* 39.4  PLT 232  --  212 218    Coag's  Recent Labs Lab 04/20/16 1410  APTT 30  INR 0.98    Sepsis Markers  Recent Labs Lab 04/21/16 1502 04/22/16 0529 04/23/16 0503  PROCALCITON <0.10 <0.10 <0.10    ABG  Recent Labs Lab 04/20/16 2002  PHART 7.416  PCO2ART 43.1  PO2ART 478.0*    Liver Enzymes  Recent Labs Lab 04/20/16 1410 04/22/16 0529  AST 27  --   ALT 24  --   ALKPHOS 53  --   BILITOT 0.8  --   ALBUMIN 3.6 3.1*    Cardiac Enzymes No results for input(s): TROPONINI, PROBNP in the last 168 hours.  Glucose  Recent Labs Lab 04/22/16 1231 04/22/16 1636 04/22/16 1935 04/22/16 2334 04/23/16 0329 04/23/16 0751  GLUCAP 90 126* 135* 135* 159* 122*    STUDIES:  CT HEAD 7/29: No acute abnormality, previous right frontal lobe subcortical infarct, microvascular disease, atrophy. MRI/MRA BRAIN 7/29: Degraded by motion. No acute intracranial abnormality. Moderate chronic small vessel ischemic disease. 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 No evidence of large vessel occlusion or flow-limiting proximal stenosis. LP 7/30>>>rbc 37, wbc 1, glu 62, prot 66,  opening pressure 12  MICROBIOLOGY: MRSA PCR 7/29:  Negative  CSF Culture 7/31 >> AFB>> Negative  Fungal Culture 7/31>>   ANTIBIOTICS: None.  SIGNIFICANT EVENTS: 7/29 - Came into ED for stroke symptoms, witnessed seizure in ED, intubated for airway protection, admitted to [facility] service 7/30 LP  LINES/TUBES: OETT 7.5 7/29 >>  OGT 7/29 >> Foley 7/29 >> PIV x 2 7/29>>  ASSESSMENT / PLAN:  NEUROLOGIC A:   Seizure - Acute w/o history. No obvious CVA on MRI. Acute Encephalopathy - Multifactorial. Sedation on Ventilator  Not following commands EEG 7/31 indicates sedated EEG indicates diffuse cerebral dysfunction with non-specific etiology (hypoxic/ischemic injury, toxic/metabolic  encephalopathies, or medication effect from Propofol.) Post ictal prolonged state? P:   Neurology Consulted & Following RASS goal: 0 to -1 AED per Neurology:  Keppra Ativan IV prn Seizure Propofol gtt - WUA mandatory and then likely extubate if weaning well Fentanyl IV prn Pain Seizure Precautions See ID Stat B12, MMA, TSH- wnl   PULMONARY A: Acute Respiratory Failure - Unable to protect airway. Weaning at present CPAP 5/8 Not Following commands  P:   Tolerating  sbt, cpap 5 ps 8,  Unclear if his neuro status meets criteria for extubation, will need to d/w family reintubation status Assess airway protection skills when alert  CARDIOVASCULAR A:  H/O HTN - Currently normotensive. Bilateral Carotid Artery Stenosis  P:  Continuous Telemetry Monitoring Vitals per Unit Protocol Continuing ASA 81mg  & Plavix 75mg  Repeat Carotid U/S to evaluate stenosis - unchanged from Dr Early  office  RENAL A:   Hypokalemia - Replaced. Acute Renal Failure  - Improving. Poor urine output  P:   Trend BMET daily Replete electrolytes daily Consider gentle diuresis Flush foley , baldder scan  GASTROINTESTINAL A:   No acute issues.  P:   NPO Hold TF now to evaluate for extubation Pepcid IV q24hr  HEMATOLOGIC A:   Leukocytosis - Possibly due to sepsis.  P:  Trending cell counts daily w/ CBC Trend fever curve Lovenox [place] daily SCDs  INFECTIOUS A:   No evidence Meningitis R/o enceoph Agitation  Afebrile P:   Continued acyclovir, follow hsv Dc al other abx Interrupt prop Dc droplet Trend fever Trend WBC  ENDOCRINE A:   Hyperglycemia - No h/o DM.  P:   SSI per Sensitive Algorithm TSH wnl  FAMILY  - Updates: Family at bedside and aware Dr. Titus Mould will come to assess for extubation.  - Inter-disciplinary family meet or Palliative Care meeting due by:  04/26/16  Magdalen Spatz, AGACNP-BC [place] [phone]  STAFF NOTE: Linwood Dibbles, MD FACP have personally reviewed patient's available data, including medical history, events of note, physical examination and test results as part of my evaluation. I have discussed with resident/NP and other care providers such as pharmacist, RN and RRT. In addition, I personally evaluated patient and elicited key findings of: agitation with wua, CTA, moves all ext, weaning  TV are good, cough good, assess rsbi, consider extubation, prostate concerns, low output, crt wnl, bladder scan, flush, may need new foley, contineud pos balance on acyclovir, LFt in am, follow HSV PCR, I have updated family in full, bmet in pm and am, no fevers, will discuss reintubation status The patient is critically ill with multiple organ systems failure and requires high complexity decision making for assessment and support, frequent evaluation and titration of therapies, application of advanced monitoring technologies and extensive interpretation of multiple databases.   Critical Care  Time devoted to patient care services described in this note is30 Minutes. This time reflects time of care of this signee: Merrie Roof, MD FACP. This critical care time does not reflect procedure time, or teaching time or supervisory time of PA/NP/Med student/Med Resident etc but could involve care discussion time. Rest per NP/medical resident whose note is outlined above and that I agree with   Lavon Paganini. Titus Mould, MD, [place] Pgr: [place] [facility] 04/23/2016 11:12 AM

## 2016-04-23 NOTE — Progress Notes (Signed)
Neurology Progress Note  Subjective: The case was reviewed with the patient's nurse bedside. She reports no significant overnight events. He remains intubated and sedated with propofol. Weaning trials are underway and he has reportedly been doing well thus far. The patient is unable to participate with review of systems because he is intubated and sedated.  Current Meds:   Current Facility-Administered Medications:  .  0.9 %  sodium chloride infusion, 250 mL, Intravenous, PRN, Sela Hua, MD, Last Rate: 10 mL/hr at 04/23/16 0900, 250 mL at 04/23/16 0900 .  acyclovir (ZOVIRAX) 645 mg in dextrose 5 % 100 mL IVPB, 10 mg/kg, Intravenous, Q12H, Raylene Miyamoto, MD, 645 mg at 04/23/16 0259 .  antiseptic oral rinse solution (CORINZ), 7 mL, Mouth Rinse, 10 times per day, Raylene Miyamoto, MD, 7 mL at 04/23/16 1030 .  aspirin chewable tablet 81 mg, 81 mg, Per Tube, Daily, Sela Hua, MD, 81 mg at 04/23/16 1059 .  chlorhexidine gluconate (SAGE KIT) (PERIDEX) 0.12 % solution 15 mL, 15 mL, Mouth Rinse, BID, Raylene Miyamoto, MD, 15 mL at 04/23/16 0708 .  clopidogrel (PLAVIX) tablet 75 mg, 75 mg, Per Tube, Daily, Sela Hua, MD, 75 mg at 04/23/16 1059 .  enoxaparin (LOVENOX) injection 40 mg, 40 mg, Subcutaneous, Q24H, Sela Hua, MD, 40 mg at 04/23/16 1058 .  famotidine (PEPCID) IVPB 20 mg premix, 20 mg, Intravenous, Q24H, Sela Hua, MD, 20 mg at 04/22/16 2039 .  feeding supplement (VITAL HIGH PROTEIN) liquid 1,000 mL, 1,000 mL, Per Tube, Q24H, Raylene Miyamoto, MD .  fentaNYL (SUBLIMAZE) injection 25-100 mcg, 25-100 mcg, Intravenous, Q1H PRN, Javier Glazier, MD, 100 mcg at 04/22/16 1053 .  fentaNYL (SUBLIMAZE) injection 50 mcg, 50 mcg, Intravenous, Q15 min PRN, Sela Hua, MD, 50 mcg at 04/22/16 0353 .  insulin aspart (novoLOG) injection 0-9 Units, 0-9 Units, Subcutaneous, Q4H, Raylene Miyamoto, MD, 1 Units at 04/23/16 0815 .  levETIRAcetam (KEPPRA) 500 mg in sodium  chloride 0.9 % 100 mL IVPB, 500 mg, Intravenous, Q12H, Raylene Miyamoto, MD, 500 mg at 04/23/16 0511 .  LORazepam (ATIVAN) injection 1-2 mg, 1-2 mg, Intravenous, Q30 min PRN, Javier Glazier, MD .  propofol (DIPRIVAN) 1000 MG/100ML infusion, 0-50 mcg/kg/min, Intravenous, Continuous, Sela Hua, MD, Last Rate: 6.5 mL/hr at 04/23/16 0900, 15 mcg/kg/min at 04/23/16 0900  Objective:  Temp:  [97.9 F (36.6 C)-98.1 F (36.7 C)] 98.1 F (36.7 C) (08/01 0800) Pulse Rate:  [53-76] 62 (08/01 0900) Resp:  [10-18] 16 (08/01 0900) BP: (88-141)/(51-122) 139/63 (08/01 0900) SpO2:  [97 %-100 %] 100 % (08/01 0900) FiO2 (%):  [30 %] 30 % (08/01 0833) Weight:  [67.7 kg (149 lb 4 oz)] 67.7 kg (149 lb 4 oz) (08/01 0500)  General: WDWN elderly Caucasian man lying in ICU bed in NAD. He is intubated and sedated with propofol running at 15 mcg/kg/min. He moves all extremities spontaneously and a semi-purposeful manner. He is currently not following commands HEENT: Neck is supple without lymphadenopathy. Endotracheal tube is in place. Sclerae are anicteric. There is no conjunctival injection.  CV: Regular, no murmur. Carotid pulses are 2+ and symmetric with no bruits. Distal pulses 2+ and symmetric.  Lungs: CTAB on anterior auscultation. Ventilated. Extremities: No C/C/E. Neuro: MS: As noted above. No aphasia.  CN: Pupils are equal and reactive from 3-->2 mm bilaterally. Eyes are conjugate. Corneals are symmetric.  Face is symmetric at rest with symmetric grimace. The lower portion of  the face is partially obscured by tubes and tape. Motor: Normal bulk and tone. He does not participate with confrontational strength testing. However, he is spontaneously moving all extremities with at least 4-/5 strength. and strength throughout. No tremor or other abnormal movements are observed.  Sensation: He withdraws from mild noxious stimuli 4.  DTRs: 2+, symmetric. Toes are mute. No pathological reflexes.   Coordination and gait: These cannot be assessed as the patient is unable to cooperate these portions of the exam.   Labs: Lab Results  Component Value Date   WBC 11.4 (H) 04/22/2016   HGB 13.0 04/22/2016   HCT 39.4 04/22/2016   PLT 218 04/22/2016   GLUCOSE 151 (H) 04/23/2016   CHOL 109 04/21/2016   TRIG 203 (H) 04/23/2016   HDL 31 (L) 04/21/2016   LDLCALC 55 04/21/2016   ALT 24 04/20/2016   AST 27 04/20/2016   NA 139 04/23/2016   K 3.6 04/23/2016   CL 111 04/23/2016   CREATININE 1.34 (H) 04/23/2016   BUN 19 04/23/2016   CO2 22 04/23/2016   TSH 2.967 04/21/2016   INR 0.98 04/20/2016   CBC Latest Ref Rng & Units 04/22/2016 04/21/2016 04/20/2016  WBC 4.0 - 10.5 K/uL 11.4(H) 11.8(H) -  Hemoglobin 13.0 - 17.0 g/dL 13.0 12.5(L) 13.3  Hematocrit 39.0 - 52.0 % 39.4 37.7(L) 39.0  Platelets 150 - 400 K/uL 218 212 -    No results found for: HGBA1C Lab Results  Component Value Date   ALT 24 04/20/2016   AST 27 04/20/2016   ALKPHOS 53 04/20/2016   BILITOT 0.8 04/20/2016   CSF HSV PCR negative  Radiology:  There is no new neuroimaging.  Other diagnostic studies:  EEG from 04/22/16 showed diffuse generalized slowing, consistent with encephalopathy. There was no evidence of epileptiform abnormality or seizure activity.  A/P:   1. 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 He has no prior history of seizure. I suspect that his seizures are due to underlying chronic cerebral ischemic changes noted on scans. There was some concern for possible CNS infection though lumbar puncture did not show any leukocytosis and the MRI did not demonstrate any typical findings associated with herpes encephalitis. HSV PCR from CSF is negative and acyclovir can be discontinued. Continue Keppra. Continue seizure precautions. I will discuss seizure precautions formally with the patient once he is extubated and  able to participate with the conversation.  2. Cerebrovascular disease: Imaging reveals evidence of chronic small vessel ischemic change with old infarcts. No evidence of acute stroke is present. His known risk factors for cerebrovascular disease include age, hypertension. He was on aspirin, Plavix, and simvastatin prior to this admission and these can be continued for secondary prevention. Ensure tight control of blood pressure and lipids long-term.  The patient's wife and daughter were present at the bedside during my visit. They were updated on the above. There were given the opportunity to ask any questions and these were addressed to their satisfaction.  Total of 35 minutes was spent, greater than 50% of which was spent in discussion with the patient's wife and daughter.  Melba Coon, MD [facility]

## 2016-04-23 NOTE — Progress Notes (Signed)
eLink Physician-Brief Progress Note Patient Name: Larry Ortega DOB: 02/04/1924 MRN: [id number]   Date of Service  04/23/2016  HPI/Events of Note  RN calls as pts uo if only 435 last 24 hrs. Pt is also (-) 1725 since admit.   ? H/o CHF. No echo seen.   On TF > will increase to goal today  Urine looks concentrated. CXR 2 days ago was clear.   eICU Interventions  Bolus with NS 500 mls over 1 hr and observe uo.       Intervention Category Intermediate Interventions: Other:  [place] 04/23/2016, 5:54 AM

## 2016-04-24 DIAGNOSIS — R269 Unspecified abnormalities of gait and mobility: Secondary | ICD-10-CM

## 2016-04-24 LAB — COMPREHENSIVE METABOLIC PANEL
ALBUMIN: 2.8 g/dL — AB (ref 3.5–5.0)
ALK PHOS: 51 U/L (ref 38–126)
ALT: 18 U/L (ref 17–63)
ANION GAP: 7 (ref 5–15)
AST: 18 U/L (ref 15–41)
BILIRUBIN TOTAL: 0.5 mg/dL (ref 0.3–1.2)
BUN: 14 mg/dL (ref 6–20)
CO2: 24 mmol/L (ref 22–32)
Calcium: 8.4 mg/dL — ABNORMAL LOW (ref 8.9–10.3)
Chloride: 111 mmol/L (ref 101–111)
Creatinine, Ser: 1.2 mg/dL (ref 0.61–1.24)
GFR calc Af Amer: 60 mL/min — ABNORMAL LOW (ref >60)
GFR calc non Af Amer: 51 mL/min — ABNORMAL LOW (ref >60)
GLUCOSE: 108 mg/dL — AB (ref 65–99)
POTASSIUM: 3.7 mmol/L (ref 3.5–5.1)
SODIUM: 142 mmol/L (ref 135–145)
TOTAL PROTEIN: 5.7 g/dL — AB (ref 6.5–8.1)

## 2016-04-24 LAB — METHYLMALONIC ACID, SERUM: Methylmalonic Acid, Quantitative: 182 nmol/L (ref 0–378)

## 2016-04-24 LAB — CBC WITH DIFFERENTIAL/PLATELET
BASOS ABS: 0 10*3/uL (ref 0.0–0.1)
Basophils Relative: 0 %
Eosinophils Absolute: 0.3 10*3/uL (ref 0.0–0.7)
Eosinophils Relative: 4 %
HCT: 37.6 % — ABNORMAL LOW (ref 39.0–52.0)
HEMOGLOBIN: 12.4 g/dL — AB (ref 13.0–17.0)
LYMPHS ABS: 1.1 10*3/uL (ref 0.7–4.0)
LYMPHS PCT: 15 %
MCH: 30.5 pg (ref 26.0–34.0)
MCHC: 33 g/dL (ref 30.0–36.0)
MCV: 92.4 fL (ref 78.0–100.0)
Monocytes Absolute: 0.8 10*3/uL (ref 0.1–1.0)
Monocytes Relative: 10 %
NEUTROS ABS: 5.4 10*3/uL (ref 1.7–7.7)
NEUTROS PCT: 71 %
PLATELETS: 220 10*3/uL (ref 150–400)
RBC: 4.07 MIL/uL — AB (ref 4.22–5.81)
RDW: 13.9 % (ref 11.5–15.5)
WBC: 7.6 10*3/uL (ref 4.0–10.5)

## 2016-04-24 LAB — TRIGLYCERIDES: TRIGLYCERIDES: 176 mg/dL — AB (ref <150)

## 2016-04-24 LAB — HSV(HERPES SMPLX VRS)ABS-I+II(IGG)-CSF: HSV TYPE I/II AB, IGG CSF: 4.9 IV — AB (ref <=0.89)

## 2016-04-24 LAB — GLUCOSE, CAPILLARY
GLUCOSE-CAPILLARY: 112 mg/dL — AB (ref 65–99)
GLUCOSE-CAPILLARY: 111 mg/dL — AB (ref 65–99)
Glucose-Capillary: 93 mg/dL (ref 65–99)
Glucose-Capillary: 189 mg/dL — ABNORMAL HIGH (ref 65–99)
Glucose-Capillary: 129 mg/dL — ABNORMAL HIGH (ref 65–99)
Glucose-Capillary: 153 mg/dL — ABNORMAL HIGH (ref 65–99)

## 2016-04-24 LAB — MAGNESIUM: MAGNESIUM: 2 mg/dL (ref 1.7–2.4)

## 2016-04-24 LAB — PHOSPHORUS: Phosphorus: 3.2 mg/dL (ref 2.5–4.6)

## 2016-04-24 MED ORDER — ENSURE ENLIVE PO LIQD
237.0000 mL | Freq: Two times a day (BID) | ORAL | Status: DC
  Administered 2016-04-25 (×2): 237 mL via ORAL

## 2016-04-24 NOTE — Evaluation (Signed)
Clinical/Bedside Swallow Evaluation Patient Details  Name: Larry Ortega MRN: [id number] Date of Birth: 11-26-1923  Today's Date: 04/24/2016 Time: SLP Start Time (ACUTE ONLY): 0816 SLP Stop Time (ACUTE ONLY): 0831 SLP Time Calculation (min) (ACUTE ONLY): 15 min  Past Medical History:  Past Medical History:  Diagnosis Date  . Bilateral carotid artery occlusion   . Cancer [facility])    prostate  . Chronic kidney disease   . Hypertensive heart disease without congestive heart failure   . Lumbar spinal stenosis   . Lumbar spondylosis    Past Surgical History:  Past Surgical History:  Procedure Laterality Date  . BOWEL RESECTION    . CHOLECYSTECTOMY    . eyelid surgery     HPI:  82 year old male with a PMH of carotid stenosis, cancer, lumbar spinal stenosis and lumbar spondylosis who presented to the ED with confusion and facial droop. Pt is active and independent at baseline. Intubated 7/29-8/1. 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   Assessment / Plan / Recommendation Clinical Impression  Possible laryngeal penetration/aspiration with thin via straw x 1 due to increased velocity prevented with cups sips. Functional mastication and transit with solid without oral residue. Recommend regular texture and thin liquid via cup, pills whole in applesauce and full supervision initially. ST will follow for swallow and request order for speech-language assessment.     Aspiration Risk   (mild-mod)    Diet Recommendation Regular;Thin liquid   Liquid Administration via: Cup;No straw Medication Administration: Whole meds with puree Supervision: Patient able to self feed;Full supervision/cueing for compensatory strategies Compensations: Slow rate;Small sips/bites Postural Changes: Seated upright at 90 degrees    Other  Recommendations Oral Care Recommendations: Oral care BID    Follow up Recommendations  None    Frequency and Duration min 2x/week  2 weeks       Prognosis Prognosis for Safe Diet Advancement: Good      Swallow Study   General HPI: 82 year old male with a PMH of carotid stenosis, cancer, lumbar spinal stenosis and lumbar spondylosis who presented to the ED with confusion and facial droop. Pt is active and independent at baseline. Intubated 7/29-8/1. 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 Type of Study: Bedside Swallow Evaluation Previous Swallow Assessment:  (none) Diet Prior to this Study: NPO Temperature Spikes Noted: No Respiratory Status: Room air History of Recent Intubation: Yes Length of Intubations (days): 5 days Date extubated: 04/23/16 Behavior/Cognition: Alert;Pleasant mood;Cooperative;Requires cueing Oral Cavity Assessment: Within Functional Limits Oral Care Completed by SLP: No Oral Cavity - Dentition: Adequate natural dentition Vision: Functional for self-feeding Self-Feeding Abilities: Able to feed self Patient Positioning: Upright in bed Baseline Vocal Quality: Normal Volitional Cough: Strong Volitional Swallow: Able to elicit    Oral/Motor/Sensory Function Overall Oral Motor/Sensory Function: Within functional limits (trace decreased left labial ROM)   Ice Chips Ice chips: Not tested   Thin Liquid Thin Liquid: Impaired Presentation: Cup;Straw Pharyngeal  Phase Impairments: Cough - Immediate    Nectar Thick Nectar Thick Liquid: Not tested   Honey Thick Honey Thick Liquid: Not tested   Puree Puree: Within functional limits   Solid    Solid: Within functional limits        Larry Ortega 04/24/2016,8:42 AM   Larry Ortega.Ed [employer] [phone]

## 2016-04-24 NOTE — Care Management Important Message (Signed)
Important Message  Patient Details  Name: Larry Ortega MRN: [id number] Date of Birth: [birth year]   Medicare Important Message Given:  Yes    Nathen May 04/24/2016, 10:43 AM

## 2016-04-24 NOTE — Progress Notes (Signed)
Made Dr. Shon Hale aware of patient's blood pressure in 170's. No orders received. Will continue to monitor patient.

## 2016-04-24 NOTE — Progress Notes (Signed)
Rehab Admissions Coordinator Note:  Patient was screened by Cleatrice Burke for appropriateness for an Inpatient Acute Rehab Consult per PT recommendation.   At this time, we are recommending Inpatient Rehab consult.  Cleatrice Burke 04/24/2016, 3:44 PM  I can be reached at [phone].

## 2016-04-24 NOTE — Progress Notes (Signed)
Chaplain stopped by at requested of son in law. Chaplain listened to stories and provided emotional care via prayer. If this family is in any need please let the chaplain know.   Thanks,  Dante Gang, Chaplain

## 2016-04-24 NOTE — Progress Notes (Signed)
Pharmacy Antibiotic Note  Larry Ortega is a 82 y.o. male admitted on 04/20/2016 with aphasia and R sided facial droop.  Started empiric antibiotics for possible meningitis, now continued on acyclovir.  HSV DNA negative, antibody level elevated.   Plan: -Acyclovir 10 mg/kg q12h -Monitor renal fx, c/s, LOT   Height: 6' (182.9 cm) Weight: 155 lb 10.3 oz (70.6 kg) IBW/kg (Calculated) : 77.6  Temp (24hrs), Avg:98.3 F (36.8 C), Min:98 F (36.7 C), Max:98.6 F (37 C)   Recent Labs Lab 04/20/16 1410  04/21/16 0330 04/22/16 0529 04/23/16 0503 04/23/16 2020 04/24/16 0440  WBC 9.5  --  11.8* 11.4*  --   --  7.6  CREATININE 1.47*  < > 1.28* 1.37* 1.34* 1.26* 1.20  < > = values in this interval not displayed.  Estimated Creatinine Clearance: 40.9 mL/min (by C-G formula based on SCr of 1.2 mg/dL).    No Known Allergies  Antimicrobials this admission: 7/30 vancomycin >>7/31 7/30 ceftriaxone >>7/31 7/30 ampicillin >>7/31 7/30 acyclovir >>  Dose adjustments this admission: NA  Microbiology results: 7/29 MRSA PCR: neg 7/30 CSF: ngtd   Thank you for allowing pharmacy to be a part of this patient's care.  Amiee Wiley D. Sheriff Rodenberg, PharmD, BCPS Clinical Pharmacist Pager: [phone] 04/24/2016 10:30 AM

## 2016-04-24 NOTE — Progress Notes (Signed)
Nutrition Follow-up  INTERVENTION:   Ensure Enlive po BID, each supplement provides 350 kcal and 20 grams of protein   NUTRITION DIAGNOSIS:   Inadequate oral intake related to  (seizures) as evidenced by  (diet just advanced). Ongoing.   GOAL:   Patient will meet greater than or equal to 90% of their needs Progressing.   MONITOR:   PO intake, Supplement acceptance, I & O's, Labs  ASSESSMENT:   Larry Ortega is a 82 year old male with a PMH of carotid stenosis who presented to the ED with aphasia and right sided facial droop. Code stroke was called. Pt had a witnessed seizure in the ED, which resolved with Ativan. There was concern that he was unable to protect his airway. PCCM was called for admission and Pt was intubated.  8/1 extubated Diet advanced to Regular CBG's: 93-189 Reports good appetite Likes ensure, will order  Diet Order:  Diet regular Room service appropriate? Yes; Fluid consistency: Thin  Skin:  Reviewed, no issues  Last BM:  8/1  Height:   Ht Readings from Last 1 Encounters:  04/20/16 6' (1.829 m)    Weight:   Wt Readings from Last 1 Encounters:  04/24/16 155 lb 10.3 oz (70.6 kg)    Ideal Body Weight:  80.9 kg  BMI:  Body mass index is 21.11 kg/m.  Estimated Nutritional Needs:   Kcal:  1600-1800  Protein:  80-90 grams  Fluid:  > 1.6 L/day  EDUCATION NEEDS:   No education needs identified at this time  [place], [place], [place] Pager [phone] After Hours Pager

## 2016-04-24 NOTE — Consult Note (Signed)
Physical Medicine and Rehabilitation Consult  Reason for Consult: Encephalopathy with seizures, cognitive deficits and balance deficitss Referring Physician: Dr. Titus Mould.    HPI: Larry Ortega is a 82 y.o. male with history of CKD, lumbar stenosis with spondylolisthesis and LLE radiculopathy, B-CAS who was admitted on 04/20/16 with confusion, dizziness and facial droop. CT head negative for acute changes.  Neurology consulted and felt that patient with symptoms likely due to [name] but family deferred t-PA or CTA as patient without motor deficits.  Patient developed seizure activity X 2 while in ED and was loaded with Keppra and intubated for airway protection. LP done and patient started on antibiotics and antiviral due ot concerns of meningitis. 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  Carotid dopplers showed severe calcific plaque origin of R-ICA/ECA 1-39% and moderate soft plaque throughout CCA with moderate mixed plaque at origin 1-39%.  EEG with nonspecific diffuse cerebral dysfunction.  He tolerated extubation on 08/01 and started on regular diet. Placed on IV acyclovir due to IGG+ but hsv DNA negative---d/ced..  PT evaluation done revealing confusion, significant balance deficits and decreased awareness of deficits. CIR recommended for follow up therapy.    Review of Systems  HENT: Negative for hearing loss.   Eyes: Negative for blurred vision and double vision.  Respiratory: Negative for cough and shortness of breath.   Cardiovascular: Negative for chest pain, palpitations and leg swelling.  Gastrointestinal: Negative for abdominal pain and heartburn.  Genitourinary: Negative for dysuria.  Musculoskeletal: Negative for myalgias.  Skin: Negative for itching and rash.  Neurological: Positive for speech change, seizures and weakness. Negative for dizziness, tingling and headaches.  Endo/Heme/Allergies:  Negative for environmental allergies.  Psychiatric/Behavioral: Negative for suicidal ideas.      Past Medical History:  Diagnosis Date  . Bilateral carotid artery occlusion   . Cancer [facility])    prostate  . Chronic kidney disease   . Hypertensive heart disease without congestive heart failure   . Lumbar spinal stenosis   . Lumbar spondylosis     Past Surgical History:  Procedure Laterality Date  . BOWEL RESECTION    . CHOLECYSTECTOMY    . eyelid surgery      No family history on file.    Social History:  Married-- has supportive family. He retired from [employer] and used to work as a Librarian, academic. He goes to gym every other day and walks on treadmill and uses bike. Used cane occasionally. He reports that he has never smoked. He has never used smokeless tobacco. His alcohol and drug histories are not on file.    Allergies: No Known Allergies    Medications Prior to Admission  Medication Sig Dispense Refill  . aspirin 81 MG tablet Take 81 mg by mouth daily.    . clopidogrel (PLAVIX) 75 MG tablet Take 75 mg by mouth daily.    . metoprolol tartrate (LOPRESSOR) 25 MG tablet Take 25 mg by mouth 2 (two) times daily.    Marland Kitchen omeprazole (PRILOSEC) 20 MG capsule Take 20 mg by mouth daily.    . simvastatin (ZOCOR) 20 MG tablet Take 20 mg by mouth daily.      Home: Home Living Family/patient expects to be discharged to:: Private residence Living Arrangements: Spouse/significant other Available Help at Discharge: Family, Available 24 hours/day Type of Home: [place]: [place] - 2 wheels, [place] - single point  Functional History: Prior Function Level of Independence:  Independent Comments: drives, mows the grass.   Functional Status:  Mobility: Bed Mobility General bed mobility comments: Pt was OOB in the chair.   Transfers Overall transfer level: Needs assistance Equipment used: None Transfers: Sit to/from Stand Sit to Stand: Min assist, Mod assist General transfer  comment: Min, up to mod assist to adjust for balance during transition to stand, verbal cues for safe hand placement and assist to stabilize the recliner chair as he was leaning his lower legs heavily on chair for support to get to standing.  Once standing increased assist needed to stabilize for balance.  Ambulation/Gait General Gait Details: NT as pt is not safe to walk without second person assisting.     ADL:    Cognition: Cognition Overall Cognitive Status: Impaired/Different from baseline Orientation Level: Oriented to person, Oriented to place Cognition Arousal/Alertness: Awake/alert Behavior During Therapy: Impulsive Overall Cognitive Status: Impaired/Different from baseline Area of Impairment: Orientation, Attention, Memory, Following commands, Safety/judgement, Awareness, Problem solving Orientation Level: Disoriented to, Place, Situation Current Attention Level: Sustained Memory: Decreased short-term memory (does not remember events surrounding his admission) Following Commands: Follows one step commands consistently Safety/Judgement: Decreased awareness of safety, Decreased awareness of deficits Awareness: Intellectual Problem Solving: Difficulty sequencing, Requires verbal cues, Requires tactile cues General Comments: "Which way are you leaning?" (pt is leaning to the far left in standing "Backwards" if anything he is actually leaning left and anteriorly.     Blood pressure (!) 184/78, pulse 67, temperature 98.5 F (36.9 C), temperature source Oral, resp. rate (!) 21, height 6' (1.829 m), weight 70.6 kg (155 lb 10.3 oz), SpO2 100 %. Physical Exam  Nursing note and vitals reviewed. Constitutional: He appears well-developed and well-nourished.  HENT:  Head: Normocephalic and atraumatic.  Mouth/Throat: Oropharynx is clear and moist.  Eyes: Conjunctivae are normal. Pupils are equal, round, and reactive to light. Right eye exhibits no discharge. Left eye exhibits no  discharge.  Neck: Normal range of motion. Neck supple.  Cardiovascular: Normal rate and regular rhythm.   Respiratory: Effort normal and breath sounds normal. No respiratory distress. He has no wheezes.  GI: Soft. Bowel sounds are normal. He exhibits no distension. There is no tenderness.  Neurological: He is alert.  Elderly male with mild expressive deficits. Oriented to self and place.  Able to state date (missed # by one), answer basic biographic questions. He was able to follow simple one and two step motor commands. Moves all four equally. Denies sensory deficits. impulsive  Skin: Skin is warm and dry. No rash noted. No erythema.  Psychiatric: He has a normal mood and affect. His behavior is normal.    Results for orders placed or performed during the hospital encounter of 04/20/16 (from the past 24 hour(s))  Magnesium     Status: None   Collection Time: 04/23/16  4:56 PM  Result Value Ref Range   Magnesium 1.8 1.7 - 2.4 mg/dL  Phosphorus     Status: None   Collection Time: 04/23/16  4:56 PM  Result Value Ref Range   Phosphorus 3.0 2.5 - 4.6 mg/dL  Glucose, capillary     Status: Abnormal   Collection Time: 04/23/16  7:47 PM  Result Value Ref Range   Glucose-Capillary 108 (H) 65 - 99 mg/dL  Basic metabolic panel     Status: Abnormal   Collection Time: 04/23/16  8:20 PM  Result Value Ref Range   Sodium 140 135 - 145 mmol/L   Potassium 3.5 3.5 - 5.1 mmol/L  Chloride 112 (H) 101 - 111 mmol/L   CO2 23 22 - 32 mmol/L   Glucose, Bld 109 (H) 65 - 99 mg/dL   BUN 14 6 - 20 mg/dL   Creatinine, Ser 1.26 (H) 0.61 - 1.24 mg/dL   Calcium 8.4 (L) 8.9 - 10.3 mg/dL   GFR calc non Af Amer 48 (L) >60 mL/min   GFR calc Af Amer 56 (L) >60 mL/min   Anion gap 5 5 - 15  Glucose, capillary     Status: Abnormal   Collection Time: 04/23/16 11:42 PM  Result Value Ref Range   Glucose-Capillary 100 (H) 65 - 99 mg/dL  Glucose, capillary     Status: None   Collection Time: 04/24/16  3:35 AM  Result  Value Ref Range   Glucose-Capillary 93 65 - 99 mg/dL  Triglycerides     Status: Abnormal   Collection Time: 04/24/16  4:40 AM  Result Value Ref Range   Triglycerides 176 (H) <150 mg/dL  CBC with Differential/Platelet     Status: Abnormal   Collection Time: 04/24/16  4:40 AM  Result Value Ref Range   WBC 7.6 4.0 - 10.5 K/uL   RBC 4.07 (L) 4.22 - 5.81 MIL/uL   Hemoglobin 12.4 (L) 13.0 - 17.0 g/dL   HCT 37.6 (L) 39.0 - 52.0 %   MCV 92.4 78.0 - 100.0 fL   MCH 30.5 26.0 - 34.0 pg   MCHC 33.0 30.0 - 36.0 g/dL   RDW 13.9 11.5 - 15.5 %   Platelets 220 150 - 400 K/uL   Neutrophils Relative % 71 %   Neutro Abs 5.4 1.7 - 7.7 K/uL   Lymphocytes Relative 15 %   Lymphs Abs 1.1 0.7 - 4.0 K/uL   Monocytes Relative 10 %   Monocytes Absolute 0.8 0.1 - 1.0 K/uL   Eosinophils Relative 4 %   Eosinophils Absolute 0.3 0.0 - 0.7 K/uL   Basophils Relative 0 %   Basophils Absolute 0.0 0.0 - 0.1 K/uL  Magnesium     Status: None   Collection Time: 04/24/16  4:40 AM  Result Value Ref Range   Magnesium 2.0 1.7 - 2.4 mg/dL  Phosphorus     Status: None   Collection Time: 04/24/16  4:40 AM  Result Value Ref Range   Phosphorus 3.2 2.5 - 4.6 mg/dL  Comprehensive metabolic panel     Status: Abnormal   Collection Time: 04/24/16  4:40 AM  Result Value Ref Range   Sodium 142 135 - 145 mmol/L   Potassium 3.7 3.5 - 5.1 mmol/L   Chloride 111 101 - 111 mmol/L   CO2 24 22 - 32 mmol/L   Glucose, Bld 108 (H) 65 - 99 mg/dL   BUN 14 6 - 20 mg/dL   Creatinine, Ser 1.20 0.61 - 1.24 mg/dL   Calcium 8.4 (L) 8.9 - 10.3 mg/dL   Total Protein 5.7 (L) 6.5 - 8.1 g/dL   Albumin 2.8 (L) 3.5 - 5.0 g/dL   AST 18 15 - 41 U/L   ALT 18 17 - 63 U/L   Alkaline Phosphatase 51 38 - 126 U/L   Total Bilirubin 0.5 0.3 - 1.2 mg/dL   GFR calc non Af Amer 51 (L) >60 mL/min   GFR calc Af Amer 60 (L) >60 mL/min   Anion gap 7 5 - 15  Glucose, capillary     Status: Abnormal   Collection Time: 04/24/16  8:12 AM  Result Value Ref Range  Glucose-Capillary 112 (H) 65 - 99 mg/dL  Glucose, capillary     Status: Abnormal   Collection Time: 04/24/16 11:28 AM  Result Value Ref Range   Glucose-Capillary 189 (H) 65 - 99 mg/dL   No results found.  Assessment/Plan: Diagnosis: encephalopathy/seizures/gait disorder 1. Does the need for close, 24 hr/day medical supervision in concert with the patient's rehab needs make it unreasonable for this patient to be served in a less intensive setting? Yes 2. Co-Morbidities requiring supervision/potential complications: fever, sz rx, cognition 3. Due to bladder management, bowel management, safety, skin/wound care, disease management, medication administration, pain management and patient education, does the patient require 24 hr/day rehab nursing? Yes 4. Does the patient require coordinated care of a physician, rehab nurse, PT (1-2 hrs/day, 5 days/week), OT (1-2 hrs/day, 5 days/week) and SLP (1-2 hrs/day, 5 days/week) to address physical and functional deficits in the context of the above medical diagnosis(es)? Yes Addressing deficits in the following areas: balance, endurance, locomotion, strength, transferring, bowel/bladder control, bathing, dressing, feeding, grooming, toileting, cognition and psychosocial support 5. Can the patient actively participate in an intensive therapy program of at least 3 hrs of therapy per day at least 5 days per week? Yes 6. The potential for patient to make measurable gains while on inpatient rehab is excellent 7. Anticipated functional outcomes upon discharge from inpatient rehab are modified independent  with PT, modified independent with OT, modified independent, supervision and n/a with SLP. 8. Estimated rehab length of stay to reach the above functional goals is: 7-10 days 9. Does the patient have adequate social supports and living environment to accommodate these discharge functional goals? Yes 10. Anticipated D/C setting: Home 11. Anticipated post D/C  treatments: HH therapy and Outpatient therapy 12. Overall Rehab/Functional Prognosis: excellent  RECOMMENDATIONS: This patient's condition is appropriate for continued rehabilitative care in the following setting: CIR Patient has agreed to participate in recommended program. Yes and Potentially Note that insurance prior authorization may be required for reimbursement for recommended care.  Comment: Rehab Admissions Coordinator to follow up.  Thanks,  Meredith Staggers, MD, Mellody Drown     04/24/2016

## 2016-04-24 NOTE — Evaluation (Signed)
Physical Therapy Evaluation Patient Details Name: Larry Ortega MRN: [id number] DOB: 05-22-1924 Today's Date: 04/24/2016   History of Present Illness  82 y.o. male admitted to [facility] on 04/20/16 for AMS, seizure, facial droop.  Pt was suspected to have a stroke, CT was negative.  In the ED he had a wittnessed seizure and was intubated to protect his airway. He was extubated 04/24/16.  MRI was negative for stroke.  Dx with seizure, acute encephalopathy, HSV encephalitis and post ictal.  Pt with significant PMhx of lumbar spinal stenosis and spondylosis, hypertensive heart disease, CKD, and bil carotid artery occlusion.    Clinical Impression  Pt is pleasant, still confused with significantly decreased awareness of his deficits.  He has decreased sensation and some mild weakness in his left lower extremity. In standing he has significant postural/balance deficits that he is unable to report accurately that they are present.  He would benefit, in his current presentation, from CIR level therapies at discharge.  He was very independent PTA.   PT to follow acutely for deficits listed below.       Follow Up Recommendations CIR    Equipment Recommendations  Rolling walker with 5" wheels    Recommendations for Other Services Rehab consult     Precautions / Restrictions Precautions Precautions: Fall Precaution Comments: no awareness of balance deficits. Restrictions Weight Bearing Restrictions: No      Mobility  Bed Mobility               General bed mobility comments: Pt was OOB in the chair.    Transfers Overall transfer level: Needs assistance Equipment used: None Transfers: Sit to/from Stand Sit to Stand: Min assist;Mod assist         General transfer comment: Min, up to mod assist to adjust for balance during transition to stand, verbal cues for safe hand placement and assist to stabilize the recliner chair as he was leaning his lower legs heavily on chair for support to get to  standing.  Once standing increased assist needed to stabilize for balance.   Ambulation/Gait             General Gait Details: NT as pt is not safe to walk without second person assisting.       Balance Overall balance assessment: Needs assistance Sitting-balance support: Feet supported;No upper extremity supported Sitting balance-Leahy Scale: Good     Standing balance support: Bilateral upper extremity supported Standing balance-Leahy Scale: Poor Standing balance comment: Up to mod assist in standing to keep midline balance Pt leaning anteriorly when he first stood and then to the left once anterior weight shift was corrected.  He has no awareness of his balance deficit and asked me if he could go home today.                               Pertinent Vitals/Pain Pain Assessment: No/denies pain    Home Living Family/patient expects to be discharged to:: Private residence Living Arrangements: Spouse/significant other Available Help at Discharge: Family;Available 24 hours/day Type of Home: House         Home Equipment: Walker - 2 wheels;Cane - single point      Prior Function Level of Independence: Independent         Comments: drives, mows the grass.       Hand Dominance   Dominant Hand: Right    Extremity/Trunk Assessment   Upper Extremity Assessment: Defer  to OT evaluation           Lower Extremity Assessment: LLE deficits/detail   LLE Deficits / Details: decreased left lower leg sensation in sock like pattern from proximal shin down.  The first thing he acknowleges that I am touching is his left knee.  Strength is mildly impaired on this side 4/5 vs 5/5 on right.   Cervical / Trunk Assessment: Normal;Other exceptions  Communication   Communication: Expressive difficulties (word finding slurred speech)  Cognition Arousal/Alertness: Awake/alert Behavior During Therapy: Impulsive Overall Cognitive Status: Impaired/Different from  baseline Area of Impairment: Orientation;Attention;Memory;Following commands;Safety/judgement;Awareness;Problem solving Orientation Level: Disoriented to;Place;Situation Current Attention Level: Sustained Memory: Decreased short-term memory (does not remember events surrounding his admission) Following Commands: Follows one step commands consistently Safety/Judgement: Decreased awareness of safety;Decreased awareness of deficits Awareness: Intellectual Problem Solving: Difficulty sequencing;Requires verbal cues;Requires tactile cues General Comments: "Which way are you leaning?" (pt is leaning to the far left in standing "Backwards" if anything he is actually leaning left and anteriorly.      General Comments General comments (skin integrity, edema, etc.): Pt was pulling out IV and already had his telemetry wires off when I entered the room.            Assessment/Plan    PT Assessment Patient needs continued PT services  PT Diagnosis Difficulty walking;Abnormality of gait;Generalized weakness;Altered mental status;Hemiplegia non-dominant side   PT Problem List Decreased strength;Decreased activity tolerance;Decreased mobility;Decreased balance;Decreased coordination;Decreased cognition;Decreased knowledge of use of DME;Decreased safety awareness;Decreased knowledge of precautions;Impaired sensation  PT Treatment Interventions DME instruction;Gait training;Stair training;Functional mobility training;Therapeutic activities;Therapeutic exercise;Balance training;Neuromuscular re-education;Cognitive remediation;Patient/family education   PT Goals (Current goals can be found in the Care Plan section) Acute Rehab PT Goals Patient Stated Goal: to go home as soon as possible PT Goal Formulation: With patient Time For Goal Achievement: 05/08/16 Potential to Achieve Goals: Good    Frequency Min 4X/week           End of Session Equipment Utilized During Treatment: Gait belt Activity  Tolerance: Patient tolerated treatment well Patient left: in chair;with call bell/phone within reach;with chair alarm set           Time: 1451-1518 PT Time Calculation (min) (ACUTE ONLY): 27 min   Charges:   PT Evaluation $PT Eval Moderate Complexity: 1 Procedure PT Treatments $Therapeutic Activity: 8-22 mins        Candita Borenstein B. [place], [place], DPT [phone]   04/24/2016, 3:38 PM

## 2016-04-24 NOTE — Progress Notes (Signed)
Neurology Progress Note  Subjective: The patient was extubated yesterday and has done well off of the ventilator. He is sleeping quietly when I entered the room. He arouses easily to voice. He is mildly disoriented. He is without complaints at this time.   Current Meds:   Current Facility-Administered Medications:  .  0.9 %  sodium chloride infusion, 250 mL, Intravenous, PRN, Sela Hua, MD, Last Rate: 10 mL/hr at 04/24/16 0800, 250 mL at 04/24/16 0800 .  antiseptic oral rinse solution (CORINZ), 7 mL, Mouth Rinse, Q4H, Raylene Miyamoto, MD, 7 mL at 04/24/16 0748 .  aspirin chewable tablet 81 mg, 81 mg, Per Tube, Daily, Sela Hua, MD, 81 mg at 04/23/16 1059 .  chlorhexidine gluconate (SAGE KIT) (PERIDEX) 0.12 % solution 15 mL, 15 mL, Mouth Rinse, BID, Raylene Miyamoto, MD, 15 mL at 04/24/16 0748 .  clopidogrel (PLAVIX) tablet 75 mg, 75 mg, Per Tube, Daily, Sela Hua, MD, 75 mg at 04/23/16 1059 .  enoxaparin (LOVENOX) injection 40 mg, 40 mg, Subcutaneous, Q24H, Sela Hua, MD, 40 mg at 04/23/16 1058 .  famotidine (PEPCID) IVPB 20 mg premix, 20 mg, Intravenous, Q24H, Sela Hua, MD, 20 mg at 04/23/16 2012 .  fentaNYL (SUBLIMAZE) injection 25-100 mcg, 25-100 mcg, Intravenous, Q1H PRN, Javier Glazier, MD, 100 mcg at 04/22/16 1053 .  fentaNYL (SUBLIMAZE) injection 50 mcg, 50 mcg, Intravenous, Q15 min PRN, Sela Hua, MD, 50 mcg at 04/22/16 0353 .  insulin aspart (novoLOG) injection 0-9 Units, 0-9 Units, Subcutaneous, Q4H, Raylene Miyamoto, MD, 1 Units at 04/23/16 0815 .  levETIRAcetam (KEPPRA) 500 mg in sodium chloride 0.9 % 100 mL IVPB, 500 mg, Intravenous, Q12H, Raylene Miyamoto, MD, 500 mg at 04/24/16 0656 .  LORazepam (ATIVAN) injection 1-2 mg, 1-2 mg, Intravenous, Q30 min PRN, Javier Glazier, MD .  propofol (DIPRIVAN) 1000 MG/100ML infusion, 0-50 mcg/kg/min, Intravenous, Continuous, Sela Hua, MD, Stopped at 04/23/16 1140  Objective:  Temp:  [98.2 F  (36.8 C)-98.6 F (37 C)] 98.2 F (36.8 C) (08/02 0400) Pulse Rate:  [28-83] 72 (08/02 0800) Resp:  [15-29] 23 (08/02 0800) BP: (126-172)/(60-123) 168/71 (08/02 0800) SpO2:  [74 %-100 %] 97 % (08/02 0800) FiO2 (%):  [30 %] 30 % (08/01 1100) Weight:  [70.6 kg (155 lb 10.3 oz)] 70.6 kg (155 lb 10.3 oz) (08/02 0500)  General: WDWN elderly Caucasian man sleeping quietly. He arouses easily to voice. He follows commands without delay. He is mildly confused and is oriented to self and month. Speech is clear. He has no aphasia.  HEENT: Neck is supple without lymphadenopathy. Mucous membranes appear moist and the oropharynx clear. Sclerae are anicteric. There is no conjunctival injection.  CV: Regular, no murmur. Carotid pulses are 2+ and symmetric with no bruits. Distal pulses 2+ and symmetric.  Lungs: CTAB on anterior auscultation. Extremities: No C/C/E. Neuro: MS: As noted above. No aphasia.  CN: Pupils are equal and reactive from 3-->2 mm bilaterally. He has bilateral ptosis. Extraocular movements are intact though there is breakable smooth pursuits in all directions of gaze. No nystagmus. Facial sensation appears intact to light touch. 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 Hearing appears to be intact conversational voice. His palate elevates symmetrically with midline uvula. Shoulder shrug is 5/5. Bilateral sternocleidomastoid 5/5. Tongue is midline. are conjugate. Corneals are symmetric.  Face is symmetric at rest with symmetric grimace. The lower portion of the face  is partially obscured by tubes and tape. Motor: Normal bulk and tone. Strength is normal with the exception of slight weakness with bilateral hip flexion. No pronator drift. No tremor or other abnormal movements are observed.  Sensation: He reports intact light touch.  DTRs: 2+, symmetric. Toes are downgoing. No pathological reflexes.  Coordination: Finger to nose is without dysmetria. Finger taps  are slow but symmetric.   Labs: Lab Results  Component Value Date   WBC 7.6 04/24/2016   HGB 12.4 (L) 04/24/2016   HCT 37.6 (L) 04/24/2016   PLT 220 04/24/2016   GLUCOSE 108 (H) 04/24/2016   CHOL 109 04/21/2016   TRIG 176 (H) 04/24/2016   HDL 31 (L) 04/21/2016   LDLCALC 55 04/21/2016   ALT 18 04/24/2016   AST 18 04/24/2016   NA 142 04/24/2016   K 3.7 04/24/2016   CL 111 04/24/2016   CREATININE 1.20 04/24/2016   BUN 14 04/24/2016   CO2 24 04/24/2016   TSH 2.967 04/21/2016   INR 0.98 04/20/2016   CBC Latest Ref Rng & Units 04/24/2016 04/22/2016 04/21/2016  WBC 4.0 - 10.5 K/uL 7.6 11.4(H) 11.8(H)  Hemoglobin 13.0 - 17.0 g/dL 12.4(L) 13.0 12.5(L)  Hematocrit 39.0 - 52.0 % 37.6(L) 39.4 37.7(L)  Platelets 150 - 400 K/uL 220 218 212    No results found for: HGBA1C Lab Results  Component Value Date   ALT 18 04/24/2016   AST 18 04/24/2016   ALKPHOS 51 04/24/2016   BILITOT 0.5 04/24/2016   Phosphorus 3.2 Magnesium 2.0  Radiology:  There is no new neuroimaging.  A/P:   1. Seizures: History suggests partial seizures with temporal lobe onset followed by secondary generalization. This represents first time seizure activity in this gentleman. Most likely etiology in this case would be underlying cerebrovascular disease. Continue Keppra. Continue seizure precautions.  2. Cerebrovascular disease: Imaging reveals evidence of chronic small vessel ischemic change with old infarcts without any acute ischemia. His known risk factors for cerebrovascular disease include age hypertension. He was on aspirin, Plavix, and simvastatin prior to this admission and these can be continued for secondary prevention. Ensure tight control of blood pressure and lipids long-term.  No family was present at the time of my visit   Melba Coon, MD [facility]

## 2016-04-24 NOTE — Progress Notes (Signed)
PULMONARY / CRITICAL CARE MEDICINE   Name: Larry Ortega MRN: [id number] DOB: [birth year]    ADMISSION DATE:  04/20/2016 CONSULTATION DATE:  04/20/16  REFERRING MD:  EDP  CHIEF COMPLAINT:  AMS, seizure, facial droop  HISTORY OF PRESENT ILLNESS:   Larry Ortega is a 82 year old male with a PMH of carotid stenosis (followed by Dr. Donnetta Hutching) who presented to the ED with confusion and facial droop. Seizures, mri neg, vent  SUBJECTIVE: Awake and alert sitting in bed.    VITAL SIGNS: BP (!) 168/71   Pulse 72   Temp 98 F (36.7 C) (Oral)   Resp (!) 23   Ht 6' (1.829 m)   Wt 155 lb 10.3 oz (70.6 kg)   SpO2 97%   BMI 21.11 kg/m   HEMODYNAMICS:    VENTILATOR SETTINGS: FiO2 (%):  [30 %] 30 %  INTAKE / OUTPUT: I/O last 3 completed shifts: In: 2372.6 [I.V.:1044.7; NG/GT:800; IV Piggyback:527.9] Out: 2220 [Urine:1820; Other:350; Stool:50]  PHYSICAL EXAMINATION: General: Elderly male. Sitting in bed in NAD Neuro:  GCS 15. No focal deficits. Speech is a little slow.   HEENT: Moist mucous membranes. No dysarthria Cardiovascular:  s1 s2 RRR Lungs: CTA anterior, scattered ronchi Abdomen:  Soft. Nondistended. Normal bowel sounds. Musculoskeletal:  No edema Skin: Warm and dry. No rash on exposed skin.  LABS:  BMET  Recent Labs Lab 04/23/16 0503 04/23/16 2020 04/24/16 0440  NA 139 140 142  K 3.6 3.5 3.7  CL 111 112* 111  CO2 22 23 24   BUN 19 14 14   CREATININE 1.34* 1.26* 1.20  GLUCOSE 151* 109* 108*    Electrolytes  Recent Labs Lab 04/23/16 0503 04/23/16 1656 04/23/16 2020 04/24/16 0440  CALCIUM 8.2*  --  8.4* 8.4*  MG 1.8 1.8  --  2.0  PHOS 3.3 3.0  --  3.2    CBC  Recent Labs Lab 04/21/16 0330 04/22/16 0529 04/24/16 0440  WBC 11.8* 11.4* 7.6  HGB 12.5* 13.0 12.4*  HCT 37.7* 39.4 37.6*  PLT 212 218 220    Coag's  Recent Labs Lab 04/20/16 1410  APTT 30  INR 0.98    Sepsis Markers  Recent Labs Lab 04/21/16 1502 04/22/16 0529  04/23/16 0503  PROCALCITON <0.10 <0.10 <0.10    ABG  Recent Labs Lab 04/20/16 2002  PHART 7.416  PCO2ART 43.1  PO2ART 478.0*    Liver Enzymes  Recent Labs Lab 04/20/16 1410 04/22/16 0529 04/24/16 0440  AST 27  --  18  ALT 24  --  18  ALKPHOS 53  --  51  BILITOT 0.8  --  0.5  ALBUMIN 3.6 3.1* 2.8*    Cardiac Enzymes No results for input(s): TROPONINI, PROBNP in the last 168 hours.  Glucose  Recent Labs Lab 04/23/16 1213 04/23/16 1526 04/23/16 1947 04/23/16 2342 04/24/16 0335 04/24/16 0812  GLUCAP 112* 109* 108* 100* 93 112*    STUDIES:  CT HEAD 7/29: No acute abnormality, previous right frontal lobe subcortical infarct, microvascular disease, atrophy. MRI/MRA BRAIN 7/29: Degraded by motion. No acute intracranial abnormality. Moderate chronic small vessel ischemic disease. 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 No evidence of large vessel occlusion or flow-limiting proximal stenosis. LP 7/30>>>rbc 37, wbc 1, glu 62, prot 66, opening pressure 12  MICROBIOLOGY: MRSA PCR 7/29:  Negative  CSF Culture 7/31 >>neg hsv neg AFB>> Negative  Fungal Culture 7/31>>   ANTIBIOTICS: None.  SIGNIFICANT EVENTS: 7/29 - Came into ED for stroke symptoms, witnessed  seizure in ED, intubated for airway protection, admitted to [facility] service 7/30 LP  LINES/TUBES: OETT 7.5 7/29 >> 8/1 OGT 7/29 >> 8/1 Foley 7/29 >> PIV x 2 7/29>>  Discussion: Altered mental status has resolved. He in extubated on RA. Sitting in bed eating regular diet. PCCM will sign off.   ASSESSMENT / PLAN:   Seizure - Acute w/o history. No obvious CVA on MRI. Acute Encephalopathy ; HSV encephalitis and post-ictal. Plan:   Neurology Consulted & Following AED per Neurology:  Keppra Ativan IV prn Seizure Seizure Precautions See ID Stat B12 - WNL  TSH- wnl  H/O HTN - Currently normotensive. Bilateral Carotid Artery Stenosis Plan:  Continuous Telemetry Monitoring Vitals per Unit  Protocol Continuing ASA 81mg  & Plavix 75mg  Repeat Carotid U/S to evaluate stenosis - unchanged from Dr Early  office    Acute Renal Failure  - Improving. Poor urine output Plan:   Trend BMET daily Replete electrolytes daily Consider gentle diuresis Flush foley , baldder scan  Possible HSV encephalitis  Plan:   IGG pos - DNA neg hsv - dc acyclovir Trend fever Trend WBC    Hyperglycemia - No h/o DM. Plan:   SSI per Sensitive Algorithm TSH wnl  FAMILY  - Updates: Family at bedside. Patient and family updated. PCCM to sign off 04/25/16  - Inter-disciplinary family meet or Palliative Care meeting due by:  04/26/16  - PCCM to sign off 04/25/2016  Erick Colace ACNP-BC [place] Pager # [phone] OR # [phone] if no answer  STAFF NOTE: I, Merrie Roof, MD FACP have personally reviewed patient's available data, including medical history, events of note, physical examination and test results as part of my evaluation. I have discussed with resident/NP and other care providers such as pharmacist, RN and RRT. In addition, I personally evaluated patient and elicited key findings of: Much improved neurostatus, CTA, hsv neg, dc acyclovir and clinical status also did not match, continued seizure treatment meds per neuro, PT needed, may need OT, diet slp, to [facility] to floor   Lavon Paganini. Titus Mould, MD, [place] Pgr: [facility] 04/24/2016 11:19 AM

## 2016-04-25 ENCOUNTER — Inpatient Hospital Stay (HOSPITAL_COMMUNITY): Payer: Medicare Other

## 2016-04-25 ENCOUNTER — Inpatient Hospital Stay (HOSPITAL_COMMUNITY)
Admission: RE | Admit: 2016-04-25 | Discharge: 2016-05-03 | DRG: 056 | Disposition: A | Discharge status: Home / Self Care | Payer: Medicare Other | Source: Intra-hospital | Attending: Physical Medicine & Rehabilitation | Admitting: Physical Medicine & Rehabilitation

## 2016-04-25 ENCOUNTER — Encounter (HOSPITAL_COMMUNITY): Payer: Self-pay | Admitting: Radiology

## 2016-04-25 ENCOUNTER — Encounter (HOSPITAL_COMMUNITY): Payer: Self-pay | Admitting: *Deleted

## 2016-04-25 DIAGNOSIS — K219 Gastro-esophageal reflux disease without esophagitis: Secondary | ICD-10-CM | POA: Diagnosis present

## 2016-04-25 DIAGNOSIS — K5900 Constipation, unspecified: Secondary | ICD-10-CM | POA: Diagnosis present

## 2016-04-25 DIAGNOSIS — G9340 Encephalopathy, unspecified: Secondary | ICD-10-CM | POA: Diagnosis present

## 2016-04-25 DIAGNOSIS — Z79899 Other long term (current) drug therapy: Secondary | ICD-10-CM

## 2016-04-25 DIAGNOSIS — I10 Essential (primary) hypertension: Secondary | ICD-10-CM

## 2016-04-25 DIAGNOSIS — Z7982 Long term (current) use of aspirin: Secondary | ICD-10-CM

## 2016-04-25 DIAGNOSIS — R7989 Other specified abnormal findings of blood chemistry: Secondary | ICD-10-CM | POA: Diagnosis present

## 2016-04-25 DIAGNOSIS — R569 Unspecified convulsions: Secondary | ICD-10-CM | POA: Diagnosis present

## 2016-04-25 DIAGNOSIS — R4189 Other symptoms and signs involving cognitive functions and awareness: Secondary | ICD-10-CM | POA: Diagnosis present

## 2016-04-25 DIAGNOSIS — N189 Chronic kidney disease, unspecified: Secondary | ICD-10-CM | POA: Diagnosis present

## 2016-04-25 DIAGNOSIS — I6789 Other cerebrovascular disease: Secondary | ICD-10-CM

## 2016-04-25 DIAGNOSIS — R4701 Aphasia: Secondary | ICD-10-CM | POA: Diagnosis present

## 2016-04-25 DIAGNOSIS — Z8546 Personal history of malignant neoplasm of prostate: Secondary | ICD-10-CM | POA: Diagnosis not present

## 2016-04-25 DIAGNOSIS — E876 Hypokalemia: Secondary | ICD-10-CM | POA: Diagnosis present

## 2016-04-25 DIAGNOSIS — R197 Diarrhea, unspecified: Secondary | ICD-10-CM | POA: Diagnosis not present

## 2016-04-25 DIAGNOSIS — I69393 Ataxia following cerebral infarction: Principal | ICD-10-CM

## 2016-04-25 DIAGNOSIS — I1310 Hypertensive heart and chronic kidney disease without heart failure, with stage 1 through stage 4 chronic kidney disease, or unspecified chronic kidney disease: Secondary | ICD-10-CM | POA: Diagnosis present

## 2016-04-25 DIAGNOSIS — Z7902 Long term (current) use of antithrombotics/antiplatelets: Secondary | ICD-10-CM

## 2016-04-25 LAB — ECHOCARDIOGRAM COMPLETE
HEIGHTINCHES: 72 in
WEIGHTICAEL: 2338.64 [oz_av]

## 2016-04-25 LAB — GLUCOSE, CAPILLARY
GLUCOSE-CAPILLARY: 139 mg/dL — AB (ref 65–99)
GLUCOSE-CAPILLARY: 156 mg/dL — AB (ref 65–99)
Glucose-Capillary: 133 mg/dL — ABNORMAL HIGH (ref 65–99)
Glucose-Capillary: 129 mg/dL — ABNORMAL HIGH (ref 65–99)

## 2016-04-25 LAB — CSF CULTURE W GRAM STAIN: Culture: NO GROWTH

## 2016-04-25 LAB — CSF CULTURE: SPECIAL REQUESTS: NORMAL

## 2016-04-25 MED ORDER — ACETAMINOPHEN 325 MG PO TABS
325.0000 mg | ORAL_TABLET | ORAL | Status: DC | PRN
  Administered 2016-04-27: 650 mg via ORAL
  Filled 2016-04-25: qty 2

## 2016-04-25 MED ORDER — FLEET ENEMA 7-19 GM/118ML RE ENEM: 1.0000 | ENEMA | Freq: Once | RECTAL | Status: DC | PRN

## 2016-04-25 MED ORDER — SODIUM CHLORIDE 0.9 % IV SOLN: 500.0000 mg | Freq: Two times a day (BID) | INTRAVENOUS | Status: DC

## 2016-04-25 MED ORDER — PANTOPRAZOLE SODIUM 20 MG PO TBEC
20.0000 mg | DELAYED_RELEASE_TABLET | Freq: Every day | ORAL | Status: DC
  Administered 2016-04-25 – 2016-04-28 (×4): 20 mg via ORAL
  Filled 2016-04-25 (×5): qty 1

## 2016-04-25 MED ORDER — SIMVASTATIN 20 MG PO TABS
20.0000 mg | ORAL_TABLET | Freq: Every day | ORAL | Status: DC
Start: 2016-04-25 — End: 2016-04-25

## 2016-04-25 MED ORDER — QUETIAPINE FUMARATE 25 MG PO TABS
12.5000 mg | ORAL_TABLET | Freq: Two times a day (BID) | ORAL | Status: DC | PRN
  Administered 2016-04-25 – 2016-04-26 (×2): 12.5 mg via ORAL
  Filled 2016-04-25 (×2): qty 1

## 2016-04-25 MED ORDER — ENOXAPARIN SODIUM 40 MG/0.4ML ~~LOC~~ SOLN
40.0000 mg | SUBCUTANEOUS | Status: DC
  Administered 2016-04-26 – 2016-05-02 (×7): 40 mg via SUBCUTANEOUS
  Filled 2016-04-25 (×7): qty 0.4

## 2016-04-25 MED ORDER — ASPIRIN 81 MG PO CHEW
81.0000 mg | CHEWABLE_TABLET | Freq: Every day | ORAL | Status: DC
  Administered 2016-04-26 – 2016-05-03 (×8): 81 mg via ORAL
  Filled 2016-04-25 (×8): qty 1

## 2016-04-25 MED ORDER — GUAIFENESIN-DM 100-10 MG/5ML PO SYRP
5.0000 mL | ORAL_SOLUTION | Freq: Four times a day (QID) | ORAL | Status: DC | PRN
Start: 2016-04-25 — End: 2016-05-03

## 2016-04-25 MED ORDER — ALUM & MAG HYDROXIDE-SIMETH 200-200-20 MG/5ML PO SUSP: 30.0000 mL | ORAL | Status: DC | PRN

## 2016-04-25 MED ORDER — PROCHLORPERAZINE MALEATE 5 MG PO TABS: 5.0000 mg | ORAL_TABLET | Freq: Four times a day (QID) | ORAL | Status: DC | PRN

## 2016-04-25 MED ORDER — METOPROLOL TARTRATE 25 MG PO TABS
25.0000 mg | ORAL_TABLET | Freq: Two times a day (BID) | ORAL | Status: DC
  Filled 2016-04-25: qty 1

## 2016-04-25 MED ORDER — SENNOSIDES-DOCUSATE SODIUM 8.6-50 MG PO TABS: 1.0000 | ORAL_TABLET | Freq: Every evening | ORAL | Status: DC | PRN

## 2016-04-25 MED ORDER — BISACODYL 10 MG RE SUPP: 10.0000 mg | Freq: Every day | RECTAL | Status: DC | PRN

## 2016-04-25 MED ORDER — FENTANYL CITRATE (PF) 100 MCG/2ML IJ SOLN
INTRAMUSCULAR | Status: AC
  Filled 2016-04-25: qty 2

## 2016-04-25 MED ORDER — SIMVASTATIN 20 MG PO TABS
20.0000 mg | ORAL_TABLET | Freq: Every day | ORAL | Status: DC
  Administered 2016-04-26 – 2016-05-02 (×7): 20 mg via ORAL
  Filled 2016-04-25 (×7): qty 1

## 2016-04-25 MED ORDER — METOPROLOL TARTRATE 25 MG PO TABS: 25.0000 mg | ORAL_TABLET | Freq: Two times a day (BID) | ORAL | Status: DC

## 2016-04-25 MED ORDER — PROCHLORPERAZINE EDISYLATE 5 MG/ML IJ SOLN: 5.0000 mg | Freq: Four times a day (QID) | INTRAMUSCULAR | Status: DC | PRN

## 2016-04-25 MED ORDER — DIPHENHYDRAMINE HCL 12.5 MG/5ML PO ELIX: 12.5000 mg | ORAL_SOLUTION | Freq: Four times a day (QID) | ORAL | Status: DC | PRN

## 2016-04-25 MED ORDER — METOPROLOL TARTRATE 25 MG PO TABS
25.0000 mg | ORAL_TABLET | Freq: Two times a day (BID) | ORAL | Status: DC
  Administered 2016-04-25 – 2016-04-29 (×9): 25 mg via ORAL
  Filled 2016-04-25 (×9): qty 1

## 2016-04-25 MED ORDER — LEVETIRACETAM 500 MG PO TABS
500.0000 mg | ORAL_TABLET | Freq: Two times a day (BID) | ORAL | Status: DC
  Administered 2016-04-25 – 2016-05-03 (×16): 500 mg via ORAL
  Filled 2016-04-25 (×15): qty 1

## 2016-04-25 MED ORDER — TRAZODONE HCL 50 MG PO TABS: 25.0000 mg | ORAL_TABLET | Freq: Every evening | ORAL | Status: DC | PRN

## 2016-04-25 MED ORDER — ENSURE ENLIVE PO LIQD: 237.0000 mL | Freq: Two times a day (BID) | ORAL | 12 refills | Status: DC

## 2016-04-25 MED ORDER — PROCHLORPERAZINE 25 MG RE SUPP: 12.5000 mg | Freq: Four times a day (QID) | RECTAL | Status: DC | PRN

## 2016-04-25 MED ORDER — ENSURE ENLIVE PO LIQD
237.0000 mL | Freq: Two times a day (BID) | ORAL | Status: DC
  Administered 2016-04-26 – 2016-04-30 (×8): 237 mL via ORAL

## 2016-04-25 MED ORDER — LEVETIRACETAM 500 MG PO TABS
500.0000 mg | ORAL_TABLET | Freq: Two times a day (BID) | ORAL | Status: DC
  Filled 2016-04-25: qty 1

## 2016-04-25 MED ORDER — CLOPIDOGREL BISULFATE 75 MG PO TABS
75.0000 mg | ORAL_TABLET | Freq: Every day | ORAL | Status: DC
  Administered 2016-04-26 – 2016-05-03 (×8): 75 mg via ORAL
  Filled 2016-04-25 (×8): qty 1

## 2016-04-25 MED ORDER — PERFLUTREN LIPID MICROSPHERE
1.0000 mL | INTRAVENOUS | Status: AC | PRN
  Administered 2016-04-25: 2 mL via INTRAVENOUS
  Filled 2016-04-25: qty 10

## 2016-04-25 NOTE — H&P (Signed)
Physical Medicine and Rehabilitation Admission H&P    CC: Encephalopathy with seizures, cognitive deficits and balance deficitss  HPI:  Larry Ortega is a 82 y.o. male with history of CKD, lumbar stenosis with spondylolisthesis and LLE radiculopathy, B-CAS who was admitted on 04/20/16 with confusion, dizziness and facial droop. CT head negative for acute changes.  Neurology consulted and felt that patient with symptoms likely due to [name] but family deferred t-PA or CTA as patient without motor deficits.  Patient developed seizure activity X 2 while in ED and was loaded with Keppra and intubated for airway protection. LP done and patient started on antibiotics and antiviral due ot concerns of meningitis. 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  Carotid dopplers showed severe calcific plaque origin of R-ICA/ECA 1-39% and moderate soft plaque throughout CCA with moderate mixed plaque at origin 1-39%.  EEG with nonspecific diffuse cerebral dysfunction.  He tolerated extubation on 08/01 and started on regular diet. LP with elevated protein and he was placed on IV acyclovir due to concerns of herpes encephalitis.  IGG+ but hsv DNA negative therefore acyclovir d/ced. 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 He continues to have confusion at nights and sustained a fall early his am--repeat CT head done showing stable right frontal encephalomalacia and old left BC infarct--no acute changes.  PT evaluation done revealing confusion, significant balance deficits and decreased awareness of deficits. CIR recommended for follow up therapy.    Review of Systems  HENT: Negative for hearing loss.   Eyes: Negative for blurred vision and double vision.  Respiratory: Negative for cough and shortness of breath.   Cardiovascular: Negative for chest pain, palpitations and leg swelling.    Gastrointestinal: Positive for constipation. Negative for heartburn and nausea.  Genitourinary: Positive for frequency. Negative for dysuria.  Musculoskeletal: Negative for back pain and myalgias.  Skin: Negative for itching and rash.  Neurological: Positive for seizures and weakness. Negative for dizziness, tingling and headaches.  Psychiatric/Behavioral: Positive for hallucinations. Negative for memory loss. The patient is nervous/anxious and has insomnia (was up all last night).       Past Medical History:  Diagnosis Date  . Bilateral carotid artery occlusion   . Cancer [facility])    prostate  . Chronic kidney disease   . Hypertensive heart disease without congestive heart failure   . Lumbar spinal stenosis   . Lumbar spondylosis     Past Surgical History:  Procedure Laterality Date  . BOWEL RESECTION    . CHOLECYSTECTOMY    . eyelid surgery      No family history on file.    Social History:  Married-- has supportive family. He retired from [employer] and used to work as a Librarian, academic. He goes to gym every other day and walks on treadmill and uses bike. Used cane occasionally. He reports that he has never smoked. He has never used smokeless tobacco. His alcohol and drug histories are not on file.    Allergies: No Known Allergies    Medications Prior to Admission  Medication Sig Dispense Refill  . aspirin 81 MG tablet Take 81 mg by mouth daily.    . clopidogrel (PLAVIX) 75 MG tablet Take 75 mg by mouth daily.    . metoprolol tartrate (LOPRESSOR) 25 MG tablet Take 25 mg by mouth 2 (two) times daily.    Marland Kitchen omeprazole (PRILOSEC) 20 MG capsule Take 20  mg by mouth daily.    . simvastatin (ZOCOR) 20 MG tablet Take 20 mg by mouth daily.      Home: Home Living Family/patient expects to be discharged to:: Private residence Living Arrangements: Spouse/significant other Available Help at Discharge: Family, Available 24 hours/day Type of Home: [place]: Walker - 2  wheels, [place] - single point   Functional History: Prior Function Level of Independence: Independent Comments: drives, mows the grass, goes to the gym x3 days per week.    Functional Status:  Mobility: Bed Mobility General bed mobility comments: Pt was OOB in the chair.   Transfers Overall transfer level: Needs assistance Equipment used: None Transfers: Sit to/from Stand Sit to Stand: Min assist, Mod assist General transfer comment: Min, up to mod assist to adjust for balance during transition to stand, verbal cues for safe hand placement and assist to stabilize the recliner chair as he was leaning his lower legs heavily on chair for support to get to standing.  Once standing increased assist needed to stabilize for balance.  Ambulation/Gait General Gait Details: NT as pt is not safe to walk without second person assisting.     ADL: ADL Overall ADL's : Needs assistance/impaired Grooming: Moderate assistance, Standing, Brushing hair, Wash/dry face Grooming Details (indicate cue type and reason): Mod support for balance in standing.  Cognition: Cognition Overall Cognitive Status: Impaired/Different from baseline Orientation Level: Oriented to person, Oriented to place, Oriented to time, Disoriented to situation Cognition Arousal/Alertness: Awake/alert Behavior During Therapy: Impulsive Overall Cognitive Status: Impaired/Different from baseline Area of Impairment: Attention, Memory, Following commands, Safety/judgement, Awareness, Problem solving Orientation Level: Disoriented to, Place, Situation Current Attention Level: Sustained Memory: Decreased short-term memory Following Commands: Follows one step commands consistently Safety/Judgement: Decreased awareness of safety, Decreased awareness of deficits Awareness: Intellectual Problem Solving: Difficulty sequencing, Requires verbal cues, Requires tactile cues General Comments: Pt able to recall he had fall and hit his head.  Identifies that he is fearful of falling now. Pt continues to be unaware of balance deficits and lean in standing.   Blood pressure (!) 150/72, pulse 72, temperature 98.1 F (36.7 C), temperature source Oral, resp. rate 20, height 6' (1.829 m), weight 66.3 kg (146 lb 2.6 oz), SpO2 96 %. Physical Exam  Nursing note and vitals reviewed. Constitutional: He appears well-developed and well-nourished.  Fatigued appearing.  HENT:  Head: Normocephalic and atraumatic.  Mouth/Throat: Oropharynx is clear and moist.  Eyes: EOM are normal. Pupils are equal, round, and reactive to light. Right conjunctiva is injected. Left conjunctiva is injected.  Neck: Normal range of motion. Neck supple.  Cardiovascular: Normal rate and regular rhythm.   No murmur heard. Respiratory: Effort normal and breath sounds normal. No stridor. No respiratory distress. He has no wheezes.  GI: Soft. Bowel sounds are normal. He exhibits no distension. There is no tenderness.  Musculoskeletal: He exhibits no edema or tenderness.  Neurological: He is alert.  Elderly male with ataxic speech and mild expressive deficits. Oriented to self and place.  Able to state date (missed # by one), answer basic biographic questions. He was able to follow simple one and two step motor commands. Moves all four equally. 4/5 prox to distal in UE's and 3+ to 4/5 in the lower extremities.  Denies sensory deficits--senses pain and light touch in all 4. impulsive   Skin: Skin is warm and dry. No erythema.  Psychiatric: His mood appears anxious. His speech is slurred. He is slowed. He expresses impulsivity.  Results for orders placed or performed during the hospital encounter of 04/20/16 (from the past 48 hour(s))  Glucose, capillary     Status: Abnormal   Collection Time: 04/23/16  3:26 PM  Result Value Ref Range   Glucose-Capillary 109 (H) 65 - 99 mg/dL  Magnesium     Status: None   Collection Time: 04/23/16  4:56 PM  Result Value Ref Range    Magnesium 1.8 1.7 - 2.4 mg/dL  Phosphorus     Status: None   Collection Time: 04/23/16  4:56 PM  Result Value Ref Range   Phosphorus 3.0 2.5 - 4.6 mg/dL  Glucose, capillary     Status: Abnormal   Collection Time: 04/23/16  7:47 PM  Result Value Ref Range   Glucose-Capillary 108 (H) 65 - 99 mg/dL  Basic metabolic panel     Status: Abnormal   Collection Time: 04/23/16  8:20 PM  Result Value Ref Range   Sodium 140 135 - 145 mmol/L   Potassium 3.5 3.5 - 5.1 mmol/L   Chloride 112 (H) 101 - 111 mmol/L   CO2 23 22 - 32 mmol/L   Glucose, Bld 109 (H) 65 - 99 mg/dL   BUN 14 6 - 20 mg/dL   Creatinine, Ser 1.26 (H) 0.61 - 1.24 mg/dL   Calcium 8.4 (L) 8.9 - 10.3 mg/dL   GFR calc non Af Amer 48 (L) >60 mL/min   GFR calc Af Amer 56 (L) >60 mL/min    Comment: (NOTE) The eGFR has been calculated using the CKD EPI equation. This calculation has not been validated in all clinical situations. eGFR's persistently <60 mL/min signify possible Chronic Kidney Disease.    Anion gap 5 5 - 15  Glucose, capillary     Status: Abnormal   Collection Time: 04/23/16 11:42 PM  Result Value Ref Range   Glucose-Capillary 100 (H) 65 - 99 mg/dL  Glucose, capillary     Status: None   Collection Time: 04/24/16  3:35 AM  Result Value Ref Range   Glucose-Capillary 93 65 - 99 mg/dL  Triglycerides     Status: Abnormal   Collection Time: 04/24/16  4:40 AM  Result Value Ref Range   Triglycerides 176 (H) <150 mg/dL  CBC with Differential/Platelet     Status: Abnormal   Collection Time: 04/24/16  4:40 AM  Result Value Ref Range   WBC 7.6 4.0 - 10.5 K/uL   RBC 4.07 (L) 4.22 - 5.81 MIL/uL   Hemoglobin 12.4 (L) 13.0 - 17.0 g/dL   HCT 37.6 (L) 39.0 - 52.0 %   MCV 92.4 78.0 - 100.0 fL   MCH 30.5 26.0 - 34.0 pg   MCHC 33.0 30.0 - 36.0 g/dL   RDW 13.9 11.5 - 15.5 %   Platelets 220 150 - 400 K/uL   Neutrophils Relative % 71 %   Neutro Abs 5.4 1.7 - 7.7 K/uL   Lymphocytes Relative 15 %   Lymphs Abs 1.1 0.7 - 4.0 K/uL    Monocytes Relative 10 %   Monocytes Absolute 0.8 0.1 - 1.0 K/uL   Eosinophils Relative 4 %   Eosinophils Absolute 0.3 0.0 - 0.7 K/uL   Basophils Relative 0 %   Basophils Absolute 0.0 0.0 - 0.1 K/uL  Magnesium     Status: None   Collection Time: 04/24/16  4:40 AM  Result Value Ref Range   Magnesium 2.0 1.7 - 2.4 mg/dL  Phosphorus     Status: None   Collection Time: 04/24/16  4:40 AM  Result Value Ref Range   Phosphorus 3.2 2.5 - 4.6 mg/dL  Comprehensive metabolic panel     Status: Abnormal   Collection Time: 04/24/16  4:40 AM  Result Value Ref Range   Sodium 142 135 - 145 mmol/L   Potassium 3.7 3.5 - 5.1 mmol/L   Chloride 111 101 - 111 mmol/L   CO2 24 22 - 32 mmol/L   Glucose, Bld 108 (H) 65 - 99 mg/dL   BUN 14 6 - 20 mg/dL   Creatinine, Ser 1.20 0.61 - 1.24 mg/dL   Calcium 8.4 (L) 8.9 - 10.3 mg/dL   Total Protein 5.7 (L) 6.5 - 8.1 g/dL   Albumin 2.8 (L) 3.5 - 5.0 g/dL   AST 18 15 - 41 U/L   ALT 18 17 - 63 U/L   Alkaline Phosphatase 51 38 - 126 U/L   Total Bilirubin 0.5 0.3 - 1.2 mg/dL   GFR calc non Af Amer 51 (L) >60 mL/min   GFR calc Af Amer 60 (L) >60 mL/min    Comment: (NOTE) The eGFR has been calculated using the CKD EPI equation. This calculation has not been validated in all clinical situations. eGFR's persistently <60 mL/min signify possible Chronic Kidney Disease.    Anion gap 7 5 - 15  Glucose, capillary     Status: Abnormal   Collection Time: 04/24/16  8:12 AM  Result Value Ref Range   Glucose-Capillary 112 (H) 65 - 99 mg/dL  Glucose, capillary     Status: Abnormal   Collection Time: 04/24/16 11:28 AM  Result Value Ref Range   Glucose-Capillary 189 (H) 65 - 99 mg/dL  Glucose, capillary     Status: Abnormal   Collection Time: 04/24/16  3:41 PM  Result Value Ref Range   Glucose-Capillary 129 (H) 65 - 99 mg/dL  Glucose, capillary     Status: Abnormal   Collection Time: 04/24/16  7:58 PM  Result Value Ref Range   Glucose-Capillary 153 (H) 65 - 99 mg/dL   Glucose, capillary     Status: Abnormal   Collection Time: 04/24/16 11:24 PM  Result Value Ref Range   Glucose-Capillary 111 (H) 65 - 99 mg/dL  Glucose, capillary     Status: Abnormal   Collection Time: 04/25/16  3:28 AM  Result Value Ref Range   Glucose-Capillary 139 (H) 65 - 99 mg/dL  Glucose, capillary     Status: Abnormal   Collection Time: 04/25/16  8:25 AM  Result Value Ref Range   Glucose-Capillary 133 (H) 65 - 99 mg/dL  Glucose, capillary     Status: Abnormal   Collection Time: 04/25/16 11:39 AM  Result Value Ref Range   Glucose-Capillary 129 (H) 65 - 99 mg/dL   Comment 1 Notify RN    Comment 2 Document in Chart    Ct Head Wo Contrast  Result Date: 04/25/2016 CLINICAL DATA:  [place], hitting head on floor. EXAM: CT HEAD WITHOUT CONTRAST TECHNIQUE: Contiguous axial images were obtained from the base of the skull through the vertex without intravenous contrast. COMPARISON:  MRI of the head April 20, 2016 FINDINGS: INTRACRANIAL CONTENTS: The ventricles and sulci are normal for age. No intraparenchymal hemorrhage, mass effect nor midline shift. Patchy supratentorial white matter hypodensities are within normal range for patient's age and though non-specific likely represent chronic small vessel ischemic disease. Old LEFT basal ganglia lacunar infarct. Small area RIGHT frontal encephalomalacia. No acute large vascular territory infarcts. No abnormal extra-axial fluid collections. Basal cisterns are  patent. Severe calcific atherosclerosis of the carotid siphons. ORBITS: The included ocular globes and orbital contents are non-suspicious. Status post bilateral ocular lens implants. SINUSES: The mastoid aircells and included paranasal sinuses are well-aerated. SKULL/SOFT TISSUES: Small RIGHT parietal scalp hematoma, no subcutaneous gas or radiopaque foreign bodies. IMPRESSION: No acute intracranial process. Stable appearance of the head including old small RIGHT frontal lobe infarct. Electronically  Signed   By: Elon Alas M.D.   On: 04/25/2016 01:39       Medical Problem List and Plan: 1.  Cognitive, mobility and functional deficits secondary to encephalopathy and seizures 2.  DVT Prophylaxis/Anticoagulation: Pharmaceutical: Lovenox 3. Pain Management: Tylenol prn. Denies pain at this point 4. Mood: LCSW to follow for evaluation and support.  5. Neuropsych: This patient is not capable of making decisions on his own behalf.  -have placed pt in bed close to nurses station to due safety issues  -minimize restraints as possible  -normalize sleep wake cycle 6. Skin/Wound Care: Routine pressure relief measures. Maintain adequate nutrition and hydration status.  7. Fluids/Electrolytes/Nutrition: Monitor I/O. Check lytes in am.  8. New onset seizures: On keppra bid.  9. HTN: Monitor BID. Continue metoprolol 10 GERD: Will resume PPI. 11. Altered mental status/delirium: Will add Seroquel prn for agitation/sundowning.  Monitor for recurrent seizures.  12. Hypertensive heart disease: On ASA/Plavix/Zocor    Post Admission Physician Evaluation: 1. Functional deficits secondary  to encephalopathy. 2. Patient is admitted to receive collaborative, interdisciplinary care between the physiatrist, rehab nursing staff, and therapy team. 3. Patient's level of medical complexity and substantial therapy needs in context of that medical necessity cannot be provided at a lesser intensity of care such as a SNF. 4. Patient has experienced substantial functional loss from his/her baseline which was documented above under the "Functional History" and "Functional Status" headings.  Judging by the patient's diagnosis, physical exam, and functional history, the patient has potential for functional progress which will result in measurable gains while on inpatient rehab.  These gains will be of substantial and practical use upon discharge  in facilitating mobility and self-care at the household  level. 5. Physiatrist will provide 24 hour management of medical needs as well as oversight of the therapy plan/treatment and provide guidance as appropriate regarding the interaction of the two. 6. 24 hour rehab nursing will assist with bladder management, bowel management, safety, skin/wound care, disease management, medication administration, pain management and patient education  and help integrate therapy concepts, techniques,education, etc. 7. PT will assess and treat for/with: Lower extremity strength, range of motion, stamina, balance, functional mobility, safety, adaptive techniques and equipment, NMR, cognitive perceptual rx, family education.   Goals are: mod I. 8. OT will assess and treat for/with: ADL's, functional mobility, safety, upper extremity strength, adaptive techniques and equipment, NMR, cognitive perceptual awareness, family ed, community reintegration.   Goals are: mod I to supervision. Therapy may proceed with showering this patient. 9. SLP will assess and treat for/with: cognition, communication, family education.  Goals are: supervision. 10. Case Management and Social Worker will assess and treat for psychological issues and discharge planning. 11. Team conference will be held weekly to assess progress toward goals and to determine barriers to discharge. 12. Patient will receive at least 3 hours of therapy per day at least 5 days per week. 13. ELOS: 7-10 days       14. Prognosis:  excellent     Meredith Staggers, MD, [place] [facility] 04/25/2016  04/25/2016

## 2016-04-25 NOTE — Progress Notes (Signed)
Speech Language Pathology Treatment: Dysphagia  Patient Details Name: Delshon Kanhai MRN: [id number] DOB: 07-May-1924 Today's Date: 04/25/2016 Time: [id number] SLP Time Calculation (min) (ACUTE ONLY): 10 min  Assessment / Plan / Recommendation Clinical Impression  Pt seen for swallow safety for recommendations following bedside swallow 8/2. He required mod-max verbal reminders for small sips and slower rate after rapidly consuming Ensure. No cough, throat clear or wet vocal quality. Observed pill consumption via RN (whole in applesauce) and required additional bite to propel bolus. SLP recommend ed cognitive assessment which was ordered 8/3. CIR following pt for possible transfer soon (uncertain if today or tomorrow) where he will have cognitive assessment. Will defer SLE today and f/u next date.   HPI HPI: 82 year old male with a PMH of carotid stenosis, cancer, lumbar spinal stenosis and lumbar spondylosis who presented to the ED with confusion and facial droop. Pt is active and independent at baseline. Intubated 7/29-8/1. 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      SLP Plan  Continue with current plan of care     Recommendations  Diet recommendations: Regular;Thin liquid Liquids provided via: Cup Medication Administration: Whole meds with puree Compensations: Slow rate;Small sips/bites Postural Changes and/or Swallow Maneuvers: Seated upright 90 degrees             Oral Care Recommendations: Oral care BID Follow up Recommendations: Inpatient Rehab Plan: Continue with current plan of care     GO                Houston Siren 04/25/2016, 11:19 AM   Orbie Pyo Colvin Caroli.Ed [employer] [phone]

## 2016-04-25 NOTE — Interval H&P Note (Signed)
Larry Ortega was admitted today to Inpatient Rehabilitation with the diagnosis of encephalopathy.  The patient's history has been reviewed, patient examined, and there is no change in status.  Patient continues to be appropriate for intensive inpatient rehabilitation.  I have reviewed the patient's chart and labs.  Questions were answered to the patient's satisfaction. The PAPE has been reviewed and assessment remains appropriate.  Burlie Cajamarca T 04/25/2016, 7:12 PM

## 2016-04-25 NOTE — Progress Notes (Signed)
   04/25/16 0100  What Happened  Was fall witnessed? Yes  Who witnessed fall? Nunzio Cobbs, RN  Patients activity before fall other (comment) (patient attempting to get out of bed unattended)  Point of contact buttocks;head  Was patient injured? No  Follow Up  MD notified Leonel Ramsay  Time MD notified [phone]  Family notified No- patient refusal (will notify when family comes to visit patient.)  Additional tests Yes-comment (Stat CT head)  Progress note created (see row info) Yes  Adult Fall Risk Assessment  Risk Factor Category (scoring not indicated) Fall has occurred during this admission (document High fall risk)  Patient's Fall Risk High Fall Risk (>13 points)  Adult Fall Risk Interventions  Required Bundle Interventions *See Row Information* High fall risk - low, moderate, and high requirements implemented  Additional Interventions Safety Sitter/Safety Rounder;Reorient/diversional activities with confused patients;PT/OT need assessed if change in mobility from baseline;Use of appropriate toileting equipment (bedpan, BSC, etc.);Individualized elimination schedule  Screening for Fall Injury Risk  Risk For Fall Injury- See Row Information  Nurse judgement  Injury Prevention Interventions Safety Sitter/Safety Rounder  Vitals  BP (!) 181/71  MAP (mmHg) 102  BP Location Right Arm  BP Method Automatic  Patient Position (if appropriate) Lying  Pulse Rate 65  Pulse Rate Source Monitor  ECG Heart Rate 63  Cardiac Rhythm NSR  Resp 20  Oxygen Therapy  SpO2 95 %  O2 Device Room Air  Pulse Oximetry Type Continuous  Pain Assessment  Pain Assessment No/denies pain  Neurological  Neuro (WDL) X  Level of Consciousness Alert  Orientation Level Oriented to person;Oriented to place  Cognition Follows commands  Speech Clear  Pupil Assessment  Yes  R Pupil Size (mm) 3  R Pupil Shape Round  R Pupil Reaction Brisk  L Pupil Size (mm) 3  L Pupil Shape Round  L Pupil Reaction Brisk  Additional  Pupil Assessments No  RUE Motor Response Purposeful movement  RUE Motor Strength 4  LUE Motor Response Purposeful movement  LUE Motor Strength 4  RLE Motor Response Purposeful movement  RLE Motor Strength 4  LLE Motor Response Purposeful movement  LLE Motor Strength 4  Glasgow Coma Scale  Eye Opening 4  Best Verbal Response (NON-intubated) 4  Best Motor Response 6  Glasgow Coma Scale Score 14  Musculoskeletal  Musculoskeletal (WDL) WDL  Assistive Device None  Generalized Weakness Yes  Integumentary  Integumentary (WDL) WDL

## 2016-04-25 NOTE — Progress Notes (Signed)
Neurology Progress Note  Subjective: The patient was extubated 8/1 and has done well off of the ventilator. He had some agitation overnight requiring placement of a posey restraint. He has been evaluated by PM&R and is awaiting insurance approval for inpatient rehab stay. The patient is alert this morning. He has no complaints at this time.   Current Meds:   Current Facility-Administered Medications:  .  0.9 %  sodium chloride infusion, 250 mL, Intravenous, PRN, Sela Hua, MD, Stopped at 04/24/16 1900 .  aspirin chewable tablet 81 mg, 81 mg, Per Tube, Daily, Sela Hua, MD, 81 mg at 04/25/16 1005 .  clopidogrel (PLAVIX) tablet 75 mg, 75 mg, Per Tube, Daily, Sela Hua, MD, 75 mg at 04/25/16 1005 .  enoxaparin (LOVENOX) injection 40 mg, 40 mg, Subcutaneous, Q24H, Sela Hua, MD, 40 mg at 04/25/16 1005 .  feeding supplement (ENSURE ENLIVE) (ENSURE ENLIVE) liquid 237 mL, 237 mL, Oral, BID BM, Raylene Miyamoto, MD, 237 mL at 04/25/16 1006 .  fentaNYL (SUBLIMAZE) injection 25-100 mcg, 25-100 mcg, Intravenous, Q1H PRN, Javier Glazier, MD, 100 mcg at 04/22/16 1053 .  fentaNYL (SUBLIMAZE) injection 50 mcg, 50 mcg, Intravenous, Q15 min PRN, Sela Hua, MD, 50 mcg at 04/22/16 0353 .  insulin aspart (novoLOG) injection 0-9 Units, 0-9 Units, Subcutaneous, Q4H, Raylene Miyamoto, MD, 1 Units at 04/25/16 [phone] .  levETIRAcetam (KEPPRA) 500 mg in sodium chloride 0.9 % 100 mL IVPB, 500 mg, Intravenous, Q12H, Raylene Miyamoto, MD, 500 mg at 04/25/16 1011 .  LORazepam (ATIVAN) injection 1-2 mg, 1-2 mg, Intravenous, Q30 min PRN, Javier Glazier, MD .  perflutren lipid microspheres (DEFINITY) IV suspension, 1-10 mL, Intravenous, PRN, Raylene Miyamoto, MD, 2 mL at 04/25/16 1110  Objective:  Temp:  [97.9 F (36.6 C)-98.5 F (36.9 C)] 98.1 F (36.7 C) (08/03 0800) Pulse Rate:  [58-72] 67 (08/03 0900) Resp:  [13-28] 23 (08/03 0600) BP: (129-184)/(56-95) 151/93 (08/03 0900) SpO2:   [94 %-100 %] 94 % (08/03 0900) Weight:  [66.3 kg (146 lb 2.6 oz)] 66.3 kg (146 lb 2.6 oz) (08/03 0500)  General: WDWN elderly Caucasian man sitting up in bed, just finishing echocardiogram. He is alert, oriented to self, hospital, year, and month. He follows commands without delay. He appears mildly confused and impulsive.   HEENT: Neck is supple without lymphadenopathy. Mucous membranes appear moist and the oropharynx clear. Sclerae are anicteric. There is no conjunctival injection.  CV: Regular, no murmur. Carotid pulses are 2+ and symmetric with no bruits. Distal pulses 2+ and symmetric.  Lungs: CTAB on anterior auscultation. Extremities: No C/C/E. Neuro: MS: As noted above. No aphasia.  CN: Pupils are equal and reactive from 3-->2 mm bilaterally. Extraocular movements are intact though there is breakup of smooth pursuits in all directions of gaze. No nystagmus. Facial sensation appears intact to light touch. 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 Hearing appears to be intact to conversational voice. His palate elevates symmetrically with midline uvula. Shoulder shrug is 5/5. Bilateral sternocleidomastoid 5/5. Tongue is midline. Motor: Normal bulk and tone. Strength is normal with the exception of slight weakness with bilateral hip flexion. No pronator drift. No tremor or other abnormal movements are observed.  Sensation: He reports intact light touch.  DTRs: 2+, symmetric. Toes are downgoing. No pathological reflexes.  Coordination: Finger to nose is impulsive but without dysmetria. Finger taps are slow but symmetric.   Labs: Lab Results  Component Value Date   WBC 7.6 04/24/2016   HGB 12.4 (L) 04/24/2016   HCT 37.6 (L) 04/24/2016   PLT 220 04/24/2016   GLUCOSE 108 (H) 04/24/2016   CHOL 109 04/21/2016   TRIG 176 (H) 04/24/2016   HDL 31 (L) 04/21/2016   LDLCALC 55 04/21/2016   ALT 18 04/24/2016   AST 18 04/24/2016   NA 142 04/24/2016   K 3.7  04/24/2016   CL 111 04/24/2016   CREATININE 1.20 04/24/2016   BUN 14 04/24/2016   CO2 24 04/24/2016   TSH 2.967 04/21/2016   INR 0.98 04/20/2016   CBC Latest Ref Rng & Units 04/24/2016 04/22/2016 04/21/2016  WBC 4.0 - 10.5 K/uL 7.6 11.4(H) 11.8(H)  Hemoglobin 13.0 - 17.0 g/dL 12.4(L) 13.0 12.5(L)  Hematocrit 39.0 - 52.0 % 37.6(L) 39.4 37.7(L)  Platelets 150 - 400 K/uL 220 218 212    No results found for: HGBA1C Lab Results  Component Value Date   ALT 18 04/24/2016   AST 18 04/24/2016   ALKPHOS 51 04/24/2016   BILITOT 0.5 04/24/2016    Radiology:  There is no new neuroimaging.  A/P:   1. Seizures: History suggests partial seizures with temporal lobe onset followed by secondary generalization. This represents first time seizure activity in this gentleman. Most likely etiology in this case would be underlying cerebrovascular disease. Continue Keppra. Continue seizure precautions.  2. Cerebrovascular disease: Imaging reveals evidence of chronic small vessel ischemic change with old infarcts without any acute ischemia. His known risk factors for cerebrovascular disease include age hypertension. He was on aspirin, Plavix, and simvastatin prior to this admission and these can be continued for secondary prevention. Ensure tight control of blood pressure and lipids long-term.  This was discussed with his family at the bedside, including wife, daughter, and son-in-law. They are in agreement with the plan as noted. They were given the opportunity to ask any questions and these were addressed to their satisfaction. I have no further recommendations at this time and will sign off. Please call if any new issues arise.    Melba Coon, MD [facility]

## 2016-04-25 NOTE — Discharge Summary (Signed)
Physician Discharge Summary       Patient ID: Larry Ortega MRN: [id number] DOB/AGE: [birth year] 82 y.o.  Admit date: 04/20/2016 Discharge date: 04/25/2016  Discharge Diagnoses:   Seizure  Ventilator dependence  Acute Encephalopathy Cerebrovascular disease.  AKI Oliguria  H/O HTN - Currently normotensive. Bilateral Carotid Artery Stenosis Hyperglycemia Gait disturbance   Detailed Hospital Course:  82 year old male with a PMH of carotid stenosis (followed by Dr. Donnetta Hutching) who presented to the ED on 7/29 with confusion and facial droop. That morning around 10:30am, he started feeling dizzy and walked outside. His family noticed he was confused. He walked out to his car, sat inside, and would not get out. He started talking funny and was not making sense. Family called 911. Per family, he has never had a stroke before. At baseline, Pt is very healthy and active. He mows the lawn, takes full care of himself, completes all ADLs independently, takes care of his finances, etc. In the ED, code stroke was called. He was evaluated by Dr. Tasia Catchings (Neurology) who thought he likely had a L MCA stroke, given his aphasia and right facial droop. CT head showed diffuse atrophy and low density area in the right frontal region that appeared chronic in nature. Around 3:45pm, he had a witnessed seizure in the ED. He was given 1mg  Ativan with resolution of seizure. PCCM was called for admission due to concern that Pt was unable to protect his airway. He was intubated for airway protection, Keppra was loaded and further stroke evaluation was initiated. He was placed on empiric acyclovir for possible herpes encephalitis. He had an LP performed on 7/30. The results were negative for this. Antivirals were stopped. He was successfully extubated on 8/1. EEGs were negative for further seizure activity. He was cleared to leave the intensive care. In-patient rehab services were consulted at physical therapy recommendations  given gait disturbance and still some residual encephalopathy. He was seen by in-patient rehab and accepted for acute rehab services. He is cleared to be discharged to in-patient rehab as of 8/3 with the plan of care as outlined below.     Discharge Plan by active problems  Seizures  per neurology: "History suggests partial seizures with temporal lobe onset followed by secondary generalization. This represents first time seizure activity in this gentleman. Most likely etiology in this case would be underlying cerebrovascular disease". Plan  Continue Keppra.   Residual Encephalopathy: initially this was likely post-ictal. Now likely multifactorial.  Plan On-going support in the in-patient rehab department   Cerebrovascular disease Per neurology: "Imaging reveals evidence of chronic small vessel ischemic change with old infarcts withoutany acute ischemia. His known risk factors for cerebrovascular disease include age hypertension".   Plan Cont asa and plavix Tight control of lipids and BP (resumed lopressor and zocor) F/u echo  HTN Plan Resume lopressor 25mg  q 12: May need further titration   Mild hyperglycemia Plan Would repeat fasting glucose Have stopped ssi Consider hgb A1C  Gait disturbance: actually fell d/t lose of balance in ICU Plan PT/OT in acute rehab setting.  Rolling walker Significant Hospital tests/ studies   MR brain 7/29:1. No acute intracranial abnormality identified.2. Moderate chronic small vessel ischemic disease.3. Chronic right frontal and right cerebellar infarcts.4. Motion degraded head MRA without evidence of large vessel occlusion or flow limiting proximal stenosis.  EEG 7/31:  There were no epileptiform discharges or electrographic seizures seen.   CT head 8/3 (after a fall in the ICU): No acute intracranial process.  Stable appearance of the head including old small RIGHT frontal lobe infarct.  Consults: neurology   Discharge Exam: BP (!)  167/100 (BP Location: Right Arm)   Pulse 78   Temp 98.1 F (36.7 C) (Oral)   Resp (!) 22   Ht 6' (1.829 m)   Wt 146 lb 2.6 oz (66.3 kg)   SpO2 96%   BMI 19.82 kg/m   General: Elderly male. Sitting in bed in NAD Neuro:  GCS 15. No focal deficits. Speech is a little slow.   HEENT: Moist mucous membranes. No dysarthria Cardiovascular:  s1 s2 RRR Lungs: CTA anterior, no accessory use Abdomen:  Soft. Nondistended. Normal bowel sounds. Musculoskeletal:  No edema Skin: Warm and dry. No rash on exposed skin.  Labs at discharge Lab Results  Component Value Date   CREATININE 1.20 04/24/2016   BUN 14 04/24/2016   NA 142 04/24/2016   K 3.7 04/24/2016   CL 111 04/24/2016   CO2 24 04/24/2016   Lab Results  Component Value Date   WBC 7.6 04/24/2016   HGB 12.4 (L) 04/24/2016   HCT 37.6 (L) 04/24/2016   MCV 92.4 04/24/2016   PLT 220 04/24/2016   Lab Results  Component Value Date   ALT 18 04/24/2016   AST 18 04/24/2016   ALKPHOS 51 04/24/2016   BILITOT 0.5 04/24/2016   Lab Results  Component Value Date   INR 0.98 04/20/2016   CBG (last 3)   Recent Labs  04/25/16 0328 04/25/16 0825 04/25/16 1139  GLUCAP 139* 133* 129*    Current radiology studies Ct Head Wo Contrast  Result Date: 04/25/2016 CLINICAL DATA:  [place], hitting head on floor. EXAM: CT HEAD WITHOUT CONTRAST TECHNIQUE: Contiguous axial images were obtained from the base of the skull through the vertex without intravenous contrast. COMPARISON:  MRI of the head April 20, 2016 FINDINGS: INTRACRANIAL CONTENTS: The ventricles and sulci are normal for age. No intraparenchymal hemorrhage, mass effect nor midline shift. Patchy supratentorial white matter hypodensities are within normal range for patient's age and though non-specific likely represent chronic small vessel ischemic disease. Old LEFT basal ganglia lacunar infarct. Small area RIGHT frontal encephalomalacia. No acute large vascular territory infarcts. No abnormal  extra-axial fluid collections. Basal cisterns are patent. Severe calcific atherosclerosis of the carotid siphons. ORBITS: The included ocular globes and orbital contents are non-suspicious. Status post bilateral ocular lens implants. SINUSES: The mastoid aircells and included paranasal sinuses are well-aerated. SKULL/SOFT TISSUES: Small RIGHT parietal scalp hematoma, no subcutaneous gas or radiopaque foreign bodies. IMPRESSION: No acute intracranial process. Stable appearance of the head including old small RIGHT frontal lobe infarct. Electronically Signed   By: Elon Alas M.D.   On: 04/25/2016 01:39    Disposition:  Final discharge disposition not confirmed  Discharge Instructions    Diet - low sodium heart healthy    Complete by:  As directed   Increase activity slowly    Complete by:  As directed       Medication List    TAKE these medications   aspirin 81 MG tablet Take 81 mg by mouth daily.   clopidogrel 75 MG tablet Commonly known as:  PLAVIX Take 75 mg by mouth daily.   feeding supplement (ENSURE ENLIVE) Liqd Take 237 mLs by mouth 2 (two) times daily between meals.   levETIRAcetam 500 mg in sodium chloride 0.9 % 100 mL Inject 500 mg into the vein every 12 (twelve) hours.   metoprolol tartrate 25 MG tablet Commonly  known as:  LOPRESSOR Take 25 mg by mouth 2 (two) times daily.   omeprazole 20 MG capsule Commonly known as:  PRILOSEC Take 20 mg by mouth daily.   simvastatin 20 MG tablet Commonly known as:  ZOCOR Take 20 mg by mouth daily.        Discharged Condition: good  Physician Statement:   The Patient was personally examined, the discharge assessment and plan has been personally reviewed and I agree with ACNP Babcock's assessment and plan. > 30 minutes of time have been dedicated to discharge assessment, planning and discharge instructions.   Signed: Clementeen Graham 04/25/2016, 2:24 PM  STAFF NOTE: Linwood Dibbles, MD FACP have personally reviewed  patient's available data, including medical history, events of note, physical examination and test results as part of my evaluation. I have discussed with resident/NP and other care providers such as pharmacist, RN and RRT. In addition, I personally evaluated patient and elicited key findings of: no seizures, maintain epileptic medications, no delirium, alert, no distress, does NOT have any viral enceph, to rehab, I updated daughter in full  Lavon Paganini. Titus Mould, MD, [place] Pgr: [facility] 04/25/2016 3:46 PM

## 2016-04-25 NOTE — Progress Notes (Signed)
  Echocardiogram 2D Echocardiogram with Definity has been performed.  Darlina Sicilian M 04/25/2016, 11:23 AM

## 2016-04-25 NOTE — Progress Notes (Signed)
Physical Therapy Treatment Patient Details Name: Larry Ortega MRN: [id number] DOB: 10-26-1923 Today's Date: 04/25/2016    History of Present Illness 82 y.o. male admitted to [facility] on 04/20/16 for AMS, seizure, facial droop.  Pt was suspected to have a stroke, CT was negative.  In the ED he had a wittnessed seizure and was intubated to protect his airway. He was extubated 04/24/16.  MRI was negative for stroke.  Dx with seizure, acute encephalopathy, HSV encephalitis and post ictal.  Pt with significant PMhx of lumbar spinal stenosis and spondylosis, hypertensive heart disease, CKD, and bil carotid artery occlusion.      PT Comments    Patient seen for mobility progression. Patient tolerated ambulation but continues to required increased physical assist for all aspects of mobility with poor safety and awareness of deficits. Highly recommend CIR upon acute discharge. Will continue to see and progress as tolerated.  Follow Up Recommendations  CIR     Equipment Recommendations  Rolling walker with 5" wheels    Recommendations for Other Services Rehab consult     Precautions / Restrictions Precautions Precautions: Fall Precaution Comments: no awareness of balance deficits. Restrictions Weight Bearing Restrictions: No    Mobility  Bed Mobility Overal bed mobility: Needs Assistance Bed Mobility: Supine to Sit;Sit to Supine     Supine to sit: Min guard Sit to supine: Min assist   General bed mobility comments: Min assist to guide trunk from sit to supine.  Transfers Overall transfer level: Needs assistance Equipment used: None Transfers: Sit to/from Stand Sit to Stand: Min assist         General transfer comment: Min assist for balance in standing at EOB. Pt with bil LEs leaning on bed for support throughout static standing.   Ambulation/Gait Ambulation/Gait assistance: Mod assist;Max assist Ambulation Distance (Feet): 30 Feet Assistive device: 2 person hand held assist Gait  Pattern/deviations: Step-to pattern;Scissoring;Ataxic;Leaning posteriorly;Staggering left;Staggering right;Narrow base of support Gait velocity: decreased Gait velocity interpretation: <1.8 ft/sec, indicative of risk for recurrent falls General Gait Details: patient required manual facilitation of weight shift and step, patient with significant instability, significant posterior and right bias with ambulation   Stairs            Wheelchair Mobility    Modified Rankin (Stroke Patients Only)       Balance Overall balance assessment: Needs assistance Sitting-balance support: Feet supported;No upper extremity supported Sitting balance-Leahy Scale: Fair   Postural control: Posterior lean Standing balance support: No upper extremity supported;During functional activity Standing balance-Leahy Scale: Poor Standing balance comment: Strong posterior lean in standing. Able to correct x1 with max verbal cues. Mod assist to maintain upright position.                    Cognition Arousal/Alertness: Awake/alert Behavior During Therapy: Impulsive Overall Cognitive Status: Impaired/Different from baseline Area of Impairment: Attention;Memory;Following commands;Safety/judgement;Awareness;Problem solving   Current Attention Level: Sustained Memory: Decreased short-term memory Following Commands: Follows one step commands consistently Safety/Judgement: Decreased awareness of safety;Decreased awareness of deficits Awareness: Intellectual Problem Solving: Difficulty sequencing;Requires verbal cues;Requires tactile cues General Comments: Pt able to recall he had fall and hit his head. Identifies that he is fearful of falling now. Pt continues to be unaware of balance deficits and lean in standing.    Exercises      General Comments        Pertinent Vitals/Pain Pain Assessment: No/denies pain    Home Living Family/patient expects to be discharged to:: Private residence  Living  Arrangements: Spouse/significant other Available Help at Discharge: Family;Available 24 hours/day Type of Home: House              Prior Function Level of Independence: Independent      Comments: drives, mows the grass, goes to the gym x3 days per week.     PT Goals (current goals can now be found in the care plan section) Acute Rehab PT Goals Patient Stated Goal: to go home as soon as possible PT Goal Formulation: With patient Time For Goal Achievement: 05/08/16 Potential to Achieve Goals: Good Progress towards PT goals: Progressing toward goals    Frequency  Min 4X/week    PT Plan Current plan remains appropriate    Co-evaluation   Reason for Co-Treatment: Complexity of the patient's impairments (multi-system involvement);Necessary to address cognition/behavior during functional activity;For patient/therapist safety PT goals addressed during session: Mobility/safety with mobility OT goals addressed during session: ADL's and self-care     End of Session Equipment Utilized During Treatment: Gait belt Activity Tolerance: Patient tolerated treatment well Patient left: in bed;with call bell/phone within reach;with nursing/sitter in room;with family/visitor present     Time: [id number] PT Time Calculation (min) (ACUTE ONLY): 28 min  Charges:  $Therapeutic Activity: 8-22 mins                    G CodesDuncan Dull May 21, 2016, 4:11 PM [name], [place] DPT  [phone]

## 2016-04-25 NOTE — Care Management Note (Signed)
Case Management Note  Patient Details  Name: Larry Ortega MRN: [id number] Date of Birth: 1924-07-24  Subjective/Objective:    Pt medically stable for discharge today.                  Action/Plan: Plan dc to [facility] later today, per PT/OT recommendations.    Expected Discharge Date:    04/25/2016             Expected Discharge Plan:  [place]  In-House Referral:     Discharge planning Services  CM Consult  Post Acute Care Choice:    Choice offered to:     DME Arranged:    DME Agency:     HH Arranged:    HH Agency:     Status of Service:  Completed, signed off  If discussed at [employer], dates discussed:    Additional Comments:  Reinaldo Raddle, RN, BSN  Trauma/Neuro ICU Case Manager [phone]

## 2016-04-25 NOTE — Progress Notes (Signed)
Inpatient Rehabilitation  Met with patient, spouse, and son-in-law to discuss team's recommendation for IP Rehab.  They are in agreement with plan.  I have medical clearance and plan to admit to IP Rehab today.  Please call with questions.  Carmelia Roller., CCC/SLP Admission Coordinator  [place]  Cell [phone]

## 2016-04-25 NOTE — Progress Notes (Addendum)
Dr. Leonel Ramsay notified of patient having increased agitation and continuing to try to get out of bed and removing equipment.  Patient has not slept at all this evening. Order received for posey restraint. Will monitor.

## 2016-04-25 NOTE — H&P (View-Only) (Signed)
Physical Medicine and Rehabilitation Admission H&P    CC: Encephalopathy with seizures, cognitive deficits and balance deficitss  HPI:  Larry Ortega is a 82 y.o. male with history of CKD, lumbar stenosis with spondylolisthesis and LLE radiculopathy, B-CAS who was admitted on 04/20/16 with confusion, dizziness and facial droop. CT head negative for acute changes.  Neurology consulted and felt that patient with symptoms likely due to [name] but family deferred t-PA or CTA as patient without motor deficits.  Patient developed seizure activity X 2 while in ED and was loaded with Keppra and intubated for airway protection. LP done and patient started on antibiotics and antiviral due ot concerns of meningitis. 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  Carotid dopplers showed severe calcific plaque origin of R-ICA/ECA 1-39% and moderate soft plaque throughout CCA with moderate mixed plaque at origin 1-39%.  EEG with nonspecific diffuse cerebral dysfunction.  He tolerated extubation on 08/01 and started on regular diet. LP with elevated protein and he was placed on IV acyclovir due to concerns of herpes encephalitis.  IGG+ but hsv DNA negative therefore acyclovir d/ced. 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 He continues to have confusion at nights and sustained a fall early his am--repeat CT head done showing stable right frontal encephalomalacia and old left BC infarct--no acute changes.  PT evaluation done revealing confusion, significant balance deficits and decreased awareness of deficits. CIR recommended for follow up therapy.    Review of Systems  HENT: Negative for hearing loss.   Eyes: Negative for blurred vision and double vision.  Respiratory: Negative for cough and shortness of breath.   Cardiovascular: Negative for chest pain, palpitations and leg swelling.    Gastrointestinal: Positive for constipation. Negative for heartburn and nausea.  Genitourinary: Positive for frequency. Negative for dysuria.  Musculoskeletal: Negative for back pain and myalgias.  Skin: Negative for itching and rash.  Neurological: Positive for seizures and weakness. Negative for dizziness, tingling and headaches.  Psychiatric/Behavioral: Positive for hallucinations. Negative for memory loss. The patient is nervous/anxious and has insomnia (was up all last night).       Past Medical History:  Diagnosis Date  . Bilateral carotid artery occlusion   . Cancer [facility])    prostate  . Chronic kidney disease   . Hypertensive heart disease without congestive heart failure   . Lumbar spinal stenosis   . Lumbar spondylosis     Past Surgical History:  Procedure Laterality Date  . BOWEL RESECTION    . CHOLECYSTECTOMY    . eyelid surgery      No family history on file.    Social History:  Married-- has supportive family. He retired from [employer] and used to work as a Librarian, academic. He goes to gym every other day and walks on treadmill and uses bike. Used cane occasionally. He reports that he has never smoked. He has never used smokeless tobacco. His alcohol and drug histories are not on file.    Allergies: No Known Allergies    Medications Prior to Admission  Medication Sig Dispense Refill  . aspirin 81 MG tablet Take 81 mg by mouth daily.    . clopidogrel (PLAVIX) 75 MG tablet Take 75 mg by mouth daily.    . metoprolol tartrate (LOPRESSOR) 25 MG tablet Take 25 mg by mouth 2 (two) times daily.    Marland Kitchen omeprazole (PRILOSEC) 20 MG capsule Take 20  mg by mouth daily.    . simvastatin (ZOCOR) 20 MG tablet Take 20 mg by mouth daily.      Home: Home Living Family/patient expects to be discharged to:: Private residence Living Arrangements: Spouse/significant other Available Help at Discharge: Family, Available 24 hours/day Type of Home: [place]: Walker - 2  wheels, [place] - single point   Functional History: Prior Function Level of Independence: Independent Comments: drives, mows the grass, goes to the gym x3 days per week.    Functional Status:  Mobility: Bed Mobility General bed mobility comments: Pt was OOB in the chair.   Transfers Overall transfer level: Needs assistance Equipment used: None Transfers: Sit to/from Stand Sit to Stand: Min assist, Mod assist General transfer comment: Min, up to mod assist to adjust for balance during transition to stand, verbal cues for safe hand placement and assist to stabilize the recliner chair as he was leaning his lower legs heavily on chair for support to get to standing.  Once standing increased assist needed to stabilize for balance.  Ambulation/Gait General Gait Details: NT as pt is not safe to walk without second person assisting.     ADL: ADL Overall ADL's : Needs assistance/impaired Grooming: Moderate assistance, Standing, Brushing hair, Wash/dry face Grooming Details (indicate cue type and reason): Mod support for balance in standing.  Cognition: Cognition Overall Cognitive Status: Impaired/Different from baseline Orientation Level: Oriented to person, Oriented to place, Oriented to time, Disoriented to situation Cognition Arousal/Alertness: Awake/alert Behavior During Therapy: Impulsive Overall Cognitive Status: Impaired/Different from baseline Area of Impairment: Attention, Memory, Following commands, Safety/judgement, Awareness, Problem solving Orientation Level: Disoriented to, Place, Situation Current Attention Level: Sustained Memory: Decreased short-term memory Following Commands: Follows one step commands consistently Safety/Judgement: Decreased awareness of safety, Decreased awareness of deficits Awareness: Intellectual Problem Solving: Difficulty sequencing, Requires verbal cues, Requires tactile cues General Comments: Pt able to recall he had fall and hit his head.  Identifies that he is fearful of falling now. Pt continues to be unaware of balance deficits and lean in standing.   Blood pressure (!) 150/72, pulse 72, temperature 98.1 F (36.7 C), temperature source Oral, resp. rate 20, height 6' (1.829 m), weight 66.3 kg (146 lb 2.6 oz), SpO2 96 %. Physical Exam  Nursing note and vitals reviewed. Constitutional: He appears well-developed and well-nourished.  Fatigued appearing.  HENT:  Head: Normocephalic and atraumatic.  Mouth/Throat: Oropharynx is clear and moist.  Eyes: EOM are normal. Pupils are equal, round, and reactive to light. Right conjunctiva is injected. Left conjunctiva is injected.  Neck: Normal range of motion. Neck supple.  Cardiovascular: Normal rate and regular rhythm.   No murmur heard. Respiratory: Effort normal and breath sounds normal. No stridor. No respiratory distress. He has no wheezes.  GI: Soft. Bowel sounds are normal. He exhibits no distension. There is no tenderness.  Musculoskeletal: He exhibits no edema or tenderness.  Neurological: He is alert.  Elderly male with ataxic speech and mild expressive deficits. Oriented to self and place.  Able to state date (missed # by one), answer basic biographic questions. He was able to follow simple one and two step motor commands. Moves all four equally. 4/5 prox to distal in UE's and 3+ to 4/5 in the lower extremities.  Denies sensory deficits--senses pain and light touch in all 4. impulsive   Skin: Skin is warm and dry. No erythema.  Psychiatric: His mood appears anxious. His speech is slurred. He is slowed. He expresses impulsivity.  Results for orders placed or performed during the hospital encounter of 04/20/16 (from the past 48 hour(s))  Glucose, capillary     Status: Abnormal   Collection Time: 04/23/16  3:26 PM  Result Value Ref Range   Glucose-Capillary 109 (H) 65 - 99 mg/dL  Magnesium     Status: None   Collection Time: 04/23/16  4:56 PM  Result Value Ref Range    Magnesium 1.8 1.7 - 2.4 mg/dL  Phosphorus     Status: None   Collection Time: 04/23/16  4:56 PM  Result Value Ref Range   Phosphorus 3.0 2.5 - 4.6 mg/dL  Glucose, capillary     Status: Abnormal   Collection Time: 04/23/16  7:47 PM  Result Value Ref Range   Glucose-Capillary 108 (H) 65 - 99 mg/dL  Basic metabolic panel     Status: Abnormal   Collection Time: 04/23/16  8:20 PM  Result Value Ref Range   Sodium 140 135 - 145 mmol/L   Potassium 3.5 3.5 - 5.1 mmol/L   Chloride 112 (H) 101 - 111 mmol/L   CO2 23 22 - 32 mmol/L   Glucose, Bld 109 (H) 65 - 99 mg/dL   BUN 14 6 - 20 mg/dL   Creatinine, Ser 1.26 (H) 0.61 - 1.24 mg/dL   Calcium 8.4 (L) 8.9 - 10.3 mg/dL   GFR calc non Af Amer 48 (L) >60 mL/min   GFR calc Af Amer 56 (L) >60 mL/min    Comment: (NOTE) The eGFR has been calculated using the CKD EPI equation. This calculation has not been validated in all clinical situations. eGFR's persistently <60 mL/min signify possible Chronic Kidney Disease.    Anion gap 5 5 - 15  Glucose, capillary     Status: Abnormal   Collection Time: 04/23/16 11:42 PM  Result Value Ref Range   Glucose-Capillary 100 (H) 65 - 99 mg/dL  Glucose, capillary     Status: None   Collection Time: 04/24/16  3:35 AM  Result Value Ref Range   Glucose-Capillary 93 65 - 99 mg/dL  Triglycerides     Status: Abnormal   Collection Time: 04/24/16  4:40 AM  Result Value Ref Range   Triglycerides 176 (H) <150 mg/dL  CBC with Differential/Platelet     Status: Abnormal   Collection Time: 04/24/16  4:40 AM  Result Value Ref Range   WBC 7.6 4.0 - 10.5 K/uL   RBC 4.07 (L) 4.22 - 5.81 MIL/uL   Hemoglobin 12.4 (L) 13.0 - 17.0 g/dL   HCT 37.6 (L) 39.0 - 52.0 %   MCV 92.4 78.0 - 100.0 fL   MCH 30.5 26.0 - 34.0 pg   MCHC 33.0 30.0 - 36.0 g/dL   RDW 13.9 11.5 - 15.5 %   Platelets 220 150 - 400 K/uL   Neutrophils Relative % 71 %   Neutro Abs 5.4 1.7 - 7.7 K/uL   Lymphocytes Relative 15 %   Lymphs Abs 1.1 0.7 - 4.0 K/uL    Monocytes Relative 10 %   Monocytes Absolute 0.8 0.1 - 1.0 K/uL   Eosinophils Relative 4 %   Eosinophils Absolute 0.3 0.0 - 0.7 K/uL   Basophils Relative 0 %   Basophils Absolute 0.0 0.0 - 0.1 K/uL  Magnesium     Status: None   Collection Time: 04/24/16  4:40 AM  Result Value Ref Range   Magnesium 2.0 1.7 - 2.4 mg/dL  Phosphorus     Status: None   Collection Time: 04/24/16  4:40 AM  Result Value Ref Range   Phosphorus 3.2 2.5 - 4.6 mg/dL  Comprehensive metabolic panel     Status: Abnormal   Collection Time: 04/24/16  4:40 AM  Result Value Ref Range   Sodium 142 135 - 145 mmol/L   Potassium 3.7 3.5 - 5.1 mmol/L   Chloride 111 101 - 111 mmol/L   CO2 24 22 - 32 mmol/L   Glucose, Bld 108 (H) 65 - 99 mg/dL   BUN 14 6 - 20 mg/dL   Creatinine, Ser 1.20 0.61 - 1.24 mg/dL   Calcium 8.4 (L) 8.9 - 10.3 mg/dL   Total Protein 5.7 (L) 6.5 - 8.1 g/dL   Albumin 2.8 (L) 3.5 - 5.0 g/dL   AST 18 15 - 41 U/L   ALT 18 17 - 63 U/L   Alkaline Phosphatase 51 38 - 126 U/L   Total Bilirubin 0.5 0.3 - 1.2 mg/dL   GFR calc non Af Amer 51 (L) >60 mL/min   GFR calc Af Amer 60 (L) >60 mL/min    Comment: (NOTE) The eGFR has been calculated using the CKD EPI equation. This calculation has not been validated in all clinical situations. eGFR's persistently <60 mL/min signify possible Chronic Kidney Disease.    Anion gap 7 5 - 15  Glucose, capillary     Status: Abnormal   Collection Time: 04/24/16  8:12 AM  Result Value Ref Range   Glucose-Capillary 112 (H) 65 - 99 mg/dL  Glucose, capillary     Status: Abnormal   Collection Time: 04/24/16 11:28 AM  Result Value Ref Range   Glucose-Capillary 189 (H) 65 - 99 mg/dL  Glucose, capillary     Status: Abnormal   Collection Time: 04/24/16  3:41 PM  Result Value Ref Range   Glucose-Capillary 129 (H) 65 - 99 mg/dL  Glucose, capillary     Status: Abnormal   Collection Time: 04/24/16  7:58 PM  Result Value Ref Range   Glucose-Capillary 153 (H) 65 - 99 mg/dL   Glucose, capillary     Status: Abnormal   Collection Time: 04/24/16 11:24 PM  Result Value Ref Range   Glucose-Capillary 111 (H) 65 - 99 mg/dL  Glucose, capillary     Status: Abnormal   Collection Time: 04/25/16  3:28 AM  Result Value Ref Range   Glucose-Capillary 139 (H) 65 - 99 mg/dL  Glucose, capillary     Status: Abnormal   Collection Time: 04/25/16  8:25 AM  Result Value Ref Range   Glucose-Capillary 133 (H) 65 - 99 mg/dL  Glucose, capillary     Status: Abnormal   Collection Time: 04/25/16 11:39 AM  Result Value Ref Range   Glucose-Capillary 129 (H) 65 - 99 mg/dL   Comment 1 Notify RN    Comment 2 Document in Chart    Ct Head Wo Contrast  Result Date: 04/25/2016 CLINICAL DATA:  [place], hitting head on floor. EXAM: CT HEAD WITHOUT CONTRAST TECHNIQUE: Contiguous axial images were obtained from the base of the skull through the vertex without intravenous contrast. COMPARISON:  MRI of the head April 20, 2016 FINDINGS: INTRACRANIAL CONTENTS: The ventricles and sulci are normal for age. No intraparenchymal hemorrhage, mass effect nor midline shift. Patchy supratentorial white matter hypodensities are within normal range for patient's age and though non-specific likely represent chronic small vessel ischemic disease. Old LEFT basal ganglia lacunar infarct. Small area RIGHT frontal encephalomalacia. No acute large vascular territory infarcts. No abnormal extra-axial fluid collections. Basal cisterns are  patent. Severe calcific atherosclerosis of the carotid siphons. ORBITS: The included ocular globes and orbital contents are non-suspicious. Status post bilateral ocular lens implants. SINUSES: The mastoid aircells and included paranasal sinuses are well-aerated. SKULL/SOFT TISSUES: Small RIGHT parietal scalp hematoma, no subcutaneous gas or radiopaque foreign bodies. IMPRESSION: No acute intracranial process. Stable appearance of the head including old small RIGHT frontal lobe infarct. Electronically  Signed   By: Elon Alas M.D.   On: 04/25/2016 01:39       Medical Problem List and Plan: 1.  Cognitive, mobility and functional deficits secondary to encephalopathy and seizures 2.  DVT Prophylaxis/Anticoagulation: Pharmaceutical: Lovenox 3. Pain Management: Tylenol prn. Denies pain at this point 4. Mood: LCSW to follow for evaluation and support.  5. Neuropsych: This patient is not capable of making decisions on his own behalf.  -have placed pt in bed close to nurses station to due safety issues  -minimize restraints as possible  -normalize sleep wake cycle 6. Skin/Wound Care: Routine pressure relief measures. Maintain adequate nutrition and hydration status.  7. Fluids/Electrolytes/Nutrition: Monitor I/O. Check lytes in am.  8. New onset seizures: On keppra bid.  9. HTN: Monitor BID. Continue metoprolol 10 GERD: Will resume PPI. 11. Altered mental status/delirium: Will add Seroquel prn for agitation/sundowning.  Monitor for recurrent seizures.  12. Hypertensive heart disease: On ASA/Plavix/Zocor    Post Admission Physician Evaluation: 1. Functional deficits secondary  to encephalopathy. 2. Patient is admitted to receive collaborative, interdisciplinary care between the physiatrist, rehab nursing staff, and therapy team. 3. Patient's level of medical complexity and substantial therapy needs in context of that medical necessity cannot be provided at a lesser intensity of care such as a SNF. 4. Patient has experienced substantial functional loss from his/her baseline which was documented above under the "Functional History" and "Functional Status" headings.  Judging by the patient's diagnosis, physical exam, and functional history, the patient has potential for functional progress which will result in measurable gains while on inpatient rehab.  These gains will be of substantial and practical use upon discharge  in facilitating mobility and self-care at the household  level. 5. Physiatrist will provide 24 hour management of medical needs as well as oversight of the therapy plan/treatment and provide guidance as appropriate regarding the interaction of the two. 6. 24 hour rehab nursing will assist with bladder management, bowel management, safety, skin/wound care, disease management, medication administration, pain management and patient education  and help integrate therapy concepts, techniques,education, etc. 7. PT will assess and treat for/with: Lower extremity strength, range of motion, stamina, balance, functional mobility, safety, adaptive techniques and equipment, NMR, cognitive perceptual rx, family education.   Goals are: mod I. 8. OT will assess and treat for/with: ADL's, functional mobility, safety, upper extremity strength, adaptive techniques and equipment, NMR, cognitive perceptual awareness, family ed, community reintegration.   Goals are: mod I to supervision. Therapy may proceed with showering this patient. 9. SLP will assess and treat for/with: cognition, communication, family education.  Goals are: supervision. 10. Case Management and Social Worker will assess and treat for psychological issues and discharge planning. 11. Team conference will be held weekly to assess progress toward goals and to determine barriers to discharge. 12. Patient will receive at least 3 hours of therapy per day at least 5 days per week. 13. ELOS: 7-10 days       14. Prognosis:  excellent     Meredith Staggers, MD, [place] [facility] 04/25/2016  04/25/2016

## 2016-04-25 NOTE — PMR Pre-admission (Signed)
PMR Admission Coordinator Pre-Admission Assessment  Patient: Larry Ortega is an 82 y.o., male MRN: [id number] DOB: 1924-01-18 Height: 6' (182.9 cm) Weight: 66.3 kg (146 lb 2.6 oz)              Insurance Information HMO:     PPO:      PCP:      IPA:      80/20:      OTHER:  PRIMARY: Medicare A & B      Policy#: [id number]      Subscriber: Self CM Name:       Phone#:      Fax#:  Pre-Cert#:       Employer: Retired Benefits:  Phone #:      Name:  Eff. Date: 10/24/1990     Deduct: $1,316      Out of Pocket Max: unlimited      Life Max: none CIR: 100%      SNF: 100% days 1-20; 80% days 21-100 Outpatient: 80%     Co-Pay: 20% Home Health: 100%      Co-Pay: none DME: 80%     Co-Pay: 20% Providers: patient's choice  [facility] [place]      Policy#: [id number]      Subscriber: Self CM Name:       Phone#:      Fax#:  Pre-Cert#:       Employer: Retired Benefits:  Phone #: [phone]     Name:  Eff. Date:      Deduct:       Out of Pocket Max:       Life Max:  CIR:       SNF:  Outpatient:      Co-Pay:  Home Health:       Co-Pay:  DME:      Co-Pay:   Medicaid Application Date:       Case Manager:  Disability Application Date:       Case Worker:   Emergency Contact Information Contact Information    Name Relation Home Work [place]  [phone]       Current Medical History  Patient Admitting Diagnosis: Encephalopathy/seizures/gait disorder  History of Present Illness: Larry Ortega is a 82 y.o. male with history of CKD, lumbar stenosis with spondylolisthesis and LLE radiculopathy, B-CAS who was admitted on 04/20/16 with confusion, dizziness and facial droop. CT head negative for acute changes.  Neurology consulted and felt that patient with symptoms likely due to [name] but family deferred t-PA or CTA as patient without motor deficits.  Patient developed seizure activity X 2 while in ED and was loaded with Keppra and intubated for airway protection. LP done and  patient started on antibiotics and antiviral due ot concerns of meningitis. 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  Carotid dopplers showed severe calcific plaque origin of R-ICA/ECA 1-39% and moderate soft plaque throughout CCA with moderate mixed plaque at origin 1-39%.  EEG with nonspecific diffuse cerebral dysfunction.  He tolerated extubation on 08/01 and started on regular diet. Placed on IV acyclovir due to IGG+ but hsv DNA negative---d/ced.  PT evaluation done revealing confusion, significant balance deficits and decreased awareness of deficits. CIR recommended for follow up therapy and patient admitted 04/25/16.  NIH Total: 17    Past Medical History  Past Medical History:  Diagnosis Date  . Bilateral carotid artery occlusion   . Cancer ([place])  prostate  . Chronic kidney disease   . Hypertensive heart disease without congestive heart failure   . Lumbar spinal stenosis   . Lumbar spondylosis     Family History  family history is not on file.  Prior Rehab/Hospitalizations:  Has the patient had major surgery during 100 days prior to admission? No  Current Medications   Current Facility-Administered Medications:  .  0.9 %  sodium chloride infusion, 250 mL, Intravenous, PRN, Sela Hua, MD, Stopped at 04/24/16 1900 .  aspirin chewable tablet 81 mg, 81 mg, Per Tube, Daily, Sela Hua, MD, 81 mg at 04/25/16 1005 .  clopidogrel (PLAVIX) tablet 75 mg, 75 mg, Per Tube, Daily, Sela Hua, MD, 75 mg at 04/25/16 1005 .  enoxaparin (LOVENOX) injection 40 mg, 40 mg, Subcutaneous, Q24H, Sela Hua, MD, 40 mg at 04/25/16 1005 .  feeding supplement (ENSURE ENLIVE) (ENSURE ENLIVE) liquid 237 mL, 237 mL, Oral, BID BM, Raylene Miyamoto, MD, 237 mL at 04/25/16 1006 .  fentaNYL (SUBLIMAZE) injection 25-100 mcg, 25-100 mcg, Intravenous, Q1H PRN, Javier Glazier, MD, 100 mcg at 04/22/16 1053 .  fentaNYL (SUBLIMAZE)  injection 50 mcg, 50 mcg, Intravenous, Q15 min PRN, Sela Hua, MD, 50 mcg at 04/22/16 0353 .  insulin aspart (novoLOG) injection 0-9 Units, 0-9 Units, Subcutaneous, Q4H, Raylene Miyamoto, MD, 1 Units at 04/25/16 1247 .  levETIRAcetam (KEPPRA) 500 mg in sodium chloride 0.9 % 100 mL IVPB, 500 mg, Intravenous, Q12H, Raylene Miyamoto, MD, 500 mg at 04/25/16 1011 .  LORazepam (ATIVAN) injection 1-2 mg, 1-2 mg, Intravenous, Q30 min PRN, Javier Glazier, MD  Patients Current Diet: Diet regular Room service appropriate? Yes; Fluid consistency: Thin  Precautions / Restrictions Precautions Precautions: Fall Precaution Comments: no awareness of balance deficits. Restrictions Weight Bearing Restrictions: No   Has the patient had 2 or more falls or a fall with injury in the past year?No, 1 fall without injury while out in the yard  Prior Activity Level Community (5-7x/wk): Prior to admission patient was out daily in the community driving, working out, and running errands with his wife.  Patient with some mild dementia per son-in-law and he and his wife are willing to assist patient and wife as needed upon discharge.    Home Assistive Devices / Equipment Home Assistive Devices/Equipment: None Home Equipment: Walker - 2 wheels, Cane - single point  Prior Device Use: Indicate devices/aids used by the patient prior to current illness, exacerbation or injury? Patient reports using a walking stick in the yard, on uneven surfaces prior to admission  Prior Functional Level Prior Function Level of Independence: Independent Comments: drives, mows the grass, goes to the gym x3 days per week.    Self Care: Did the patient need help bathing, dressing, using the toilet or eating?  Independent  Indoor Mobility: Did the patient need assistance with walking from room to room (with or without device)? Independent  Stairs: Did the patient need assistance with internal or external stairs (with or  without device)? Independent  Functional Cognition: Did the patient need help planning regular tasks such as shopping or remembering to take medications? Independent  Current Functional Level Cognition  Overall Cognitive Status: Impaired/Different from baseline Current Attention Level: Sustained Orientation Level: Oriented to person, Oriented to place, Oriented to time, Disoriented to situation Following Commands: Follows one step commands consistently Safety/Judgement: Decreased awareness of safety, Decreased awareness of deficits General Comments: Pt able to recall he had fall and  hit his head. Identifies that he is fearful of falling now. Pt continues to be unaware of balance deficits and lean in standing.    Extremity Assessment (includes Sensation/Coordination)  Upper Extremity Assessment: LUE deficits/detail LUE Deficits / Details: AROM overall WFL. Strength generally 4/5. Pt requiring more cues to testing of LUE; decreased awareness to L side. Reports no sensation changes.  Lower Extremity Assessment: Defer to PT evaluation LLE Deficits / Details: decreased left lower leg sensation in sock like pattern from proximal shin down.  The first thing he acknowleges that I am touching is his left knee.  Strength is mildly impaired on this side 4/5 vs 5/5 on right.     ADLs  Overall ADL's : Needs assistance/impaired Grooming: Moderate assistance, Standing, Brushing hair, Wash/dry face Grooming Details (indicate cue type and reason): Mod support for balance in standing.    Mobility  General bed mobility comments: Pt was OOB in the chair.      Transfers  Overall transfer level: Needs assistance Equipment used: None Transfers: Sit to/from Stand Sit to Stand: Min assist, Mod assist General transfer comment: Min, up to mod assist to adjust for balance during transition to stand, verbal cues for safe hand placement and assist to stabilize the recliner chair as he was leaning his lower legs  heavily on chair for support to get to standing.  Once standing increased assist needed to stabilize for balance.     Ambulation / Gait / Stairs / Wheelchair Mobility  Ambulation/Gait General Gait Details: NT as pt is not safe to walk without second person assisting.     Posture / Balance Balance Overall balance assessment: Needs assistance Sitting-balance support: Feet supported, No upper extremity supported Sitting balance-Leahy Scale: Good Standing balance support: Bilateral upper extremity supported Standing balance-Leahy Scale: Poor Standing balance comment: Up to mod assist in standing to keep midline balance Pt leaning anteriorly when he first stood and then to the left once anterior weight shift was corrected.  He has no awareness of his balance deficit and asked me if he could go home today.      Special needs/care consideration BiPAP/CPAP: No CPM: No Continuous Drip IV: No Dialysis: No         Life Vest: No Oxygen: No Special Bed: No Trach Size: No Wound Vac (area): No       Skin: WDL                               Bowel mgmt: 04/23/16 Bladder mgmt: continent  Diabetic mgmt: No     Previous Home Environment Living Arrangements: Spouse/significant other Available Help at Discharge: Family, Available 24 hours/day Type of Home: [place]: No  Discharge Living Setting Plans for Discharge Living Setting: Patient's home Type of Home at Discharge: House Discharge Home Layout: Multi-level, Able to live on main level with bedroom/bathroom (bed and bath on second floor prior to admission ) Alternate Level Stairs-Rails: Left Alternate Level Stairs-Number of Steps: 12 Discharge Home Access: Stairs to enter Entrance Stairs-Rails: None Entrance Stairs-Number of Steps: 1 Discharge Bathroom Shower/Tub: Tub/shower unit Discharge Bathroom Toilet: Standard Discharge Bathroom Accessibility: Yes How Accessible: Accessible via walker Does the patient have any problems  obtaining your medications?: No  Social/Family/Support Systems Patient Roles: Spouse, Parent Contact Information: SpouseShaquille Marchetta, cell [phone] Anticipated Caregiver: Daughter and son-in-law Anticipated Caregiver's Contact Information: [name] and [employer] cell [phone] Ability/Limitations of Caregiver: Spouse can  provide Supervision assist only Caregiver Availability: 24/7 Discharge Plan Discussed with Primary Caregiver: Yes Is Caregiver In Agreement with Plan?: Yes Does Caregiver/Family have Issues with Lodging/Transportation while Pt is in Rehab?: No  Goals/Additional Needs Patient/Family Goal for Rehab: PT/OT/SLP Mod I; however, children able to provide assist as needed Expected length of stay: 7-10 days Cultural Considerations: [employer] active in his church  Dietary Needs: Regular textures and thin liquids Equipment Needs: TBD Special Service Needs: None Additional Information: None Pt/Family Agrees to Admission and willing to participate: Yes Program Orientation Provided & Reviewed with Pt/Caregiver Including Roles  & Responsibilities: Yes Additional Information Needs: None Information Needs to be Provided By: N/A   Decrease burden of Care through IP rehab admission: No   Possible need for SNF placement upon discharge: No   Patient Condition: This patient's medical and functional status has changed since the consult dated 04/25/16 in which the Rehabilitation Physician determined and documented that the patient was potentially appropriate for intensive rehabilitative care in an inpatient rehabilitation facility. Issues have been addressed and update has been discussed with Dr. Naaman Plummer and patient agreeable with plan for inpatient rehabilitation. Will admit to inpatient rehab today.    Preadmission Screen Completed By:  Gunnar Fusi, 04/25/2016 1:15 PM ______________________________________________________________________   Discussed status with Dr. Naaman Plummer  on 04/25/16 at 1325 and received telephone approval for admission today.  Admission Coordinator:  Gunnar Fusi, time 1325/Date 04/25/16

## 2016-04-25 NOTE — Evaluation (Signed)
Occupational Therapy Evaluation Patient Details Name: Larry Ortega MRN: [id number] DOB: May 11, 1924 Today's Date: 04/25/2016    History of Present Illness 82 y.o. male admitted to [facility] on 04/20/16 for AMS, seizure, facial droop.  Pt was suspected to have a stroke, CT was negative.  In the ED he had a wittnessed seizure and was intubated to protect his airway. He was extubated 04/24/16.  MRI was negative for stroke.  Dx with seizure, acute encephalopathy, HSV encephalitis and post ictal.  Pt with significant PMhx of lumbar spinal stenosis and spondylosis, hypertensive heart disease, CKD, and bil carotid artery occlusion.     Clinical Impression   Pts wife reports he was independent with ADL PTA. Currently pt overall mod assist for functional mobility and ADL in standing. Pt presenting with significant balance deficits, strong posterior lean in standing, and cognitive impairments impacting his independence and safety with ADL and functional mobility. Recommending CIR level therapies for follow up in order to maximize independence and safety with ADL and functional mobility. Pt would benefit from continued skilled OT to address established goals.    Follow Up Recommendations  CIR;Supervision/Assistance - 24 hour    Equipment Recommendations  Other (comment) (TBD at next venue)    Recommendations for Other Services       Precautions / Restrictions Precautions Precautions: Fall Precaution Comments: no awareness of balance deficits. Restrictions Weight Bearing Restrictions: No      Mobility Bed Mobility Overal bed mobility: Needs Assistance Bed Mobility: Supine to Sit;Sit to Supine     Supine to sit: Min guard Sit to supine: Min assist   General bed mobility comments: Min assist to guide trunk from sit to supine.  Transfers Overall transfer level: Needs assistance Equipment used: None Transfers: Sit to/from Stand Sit to Stand: Min assist         General transfer comment: Min  assist for balance in standing at EOB. Pt with bil LEs leaning on bed for support throughout static standing.     Balance Overall balance assessment: Needs assistance Sitting-balance support: Feet supported;No upper extremity supported Sitting balance-Leahy Scale: Fair   Postural control: Posterior lean Standing balance support: No upper extremity supported;During functional activity Standing balance-Leahy Scale: Poor Standing balance comment: Strong posterior lean in standing. Able to correct x1 with max verbal cues. Mod assist to maintain upright position.                            ADL Overall ADL's : Needs assistance/impaired     Grooming: Moderate assistance;Standing;Brushing hair;Wash/dry face Grooming Details (indicate cue type and reason): Mod support for balance in standing. Upper Body Bathing: Minimal assitance;Sitting   Lower Body Bathing: Moderate assistance;Sit to/from stand   Upper Body Dressing : Minimal assistance;Sitting   Lower Body Dressing: Moderate assistance;Sit to/from stand Lower Body Dressing Details (indicate cue type and reason): Pt able to pull up socks sitting EOB with min assist for balance. Toilet Transfer: Moderate assistance;+2 for safety/equipment;Ambulation;BSC   Toileting- Clothing Manipulation and Hygiene: Moderate assistance;Sit to/from stand       Functional mobility during ADLs: Moderate assistance;+2 for safety/equipment General ADL Comments: Pt with strong posterior lean in standing. During standing at the sink pt able to pull self into upright position x1 with max verbal cues but unable to maintain for more than a second.     Vision Additional Comments: Difficult to assess due to impaired cognition.   Perception     Praxis  Pertinent Vitals/Pain Pain Assessment: No/denies pain Faces Pain Scale: Hurts a little bit Pain Intervention(s): Monitored during session     Hand Dominance Right   Extremity/Trunk  Assessment Upper Extremity Assessment Upper Extremity Assessment: LUE deficits/detail LUE Deficits / Details: AROM overall WFL. Strength generally 4/5. Pt requiring more cues to testing of LUE; decreased awareness to L side. Reports no sensation changes.   Lower Extremity Assessment Lower Extremity Assessment: Defer to PT evaluation   Cervical / Trunk Assessment Cervical / Trunk Assessment: Normal;Other exceptions Cervical / Trunk Exceptions: seems normal, however chart reports lumbar stenosis and spondylosis.    Communication Communication Communication: Expressive difficulties (slight slurred speech)   Cognition Arousal/Alertness: Awake/alert Behavior During Therapy: Impulsive Overall Cognitive Status: Impaired/Different from baseline Area of Impairment: Attention;Memory;Following commands;Safety/judgement;Awareness;Problem solving   Current Attention Level: Sustained Memory: Decreased short-term memory Following Commands: Follows one step commands consistently Safety/Judgement: Decreased awareness of safety;Decreased awareness of deficits Awareness: Intellectual Problem Solving: Difficulty sequencing;Requires verbal cues;Requires tactile cues General Comments: Pt able to recall he had fall and hit his head. Identifies that he is fearful of falling now. Pt continues to be unaware of balance deficits and lean in standing.   General Comments       Exercises       Shoulder Instructions      Home Living Family/patient expects to be discharged to:: Private residence Living Arrangements: Spouse/significant other Available Help at Discharge: Family;Available 24 hours/day Type of Home: House                                  Prior Functioning/Environment Level of Independence: Independent        Comments: drives, mows the grass, goes to the gym x3 days per week.      OT Diagnosis: Generalized weakness;Cognitive deficits;Altered mental status   OT Problem  List: Decreased strength;Decreased activity tolerance;Impaired balance (sitting and/or standing);Impaired vision/perception;Decreased coordination;Decreased cognition;Decreased safety awareness;Decreased knowledge of use of DME or AE;Decreased knowledge of precautions   OT Treatment/Interventions: Self-care/ADL training;Neuromuscular education;Energy conservation;DME and/or AE instruction;Therapeutic activities;Cognitive remediation/compensation;Patient/family education;Balance training    OT Goals(Current goals can be found in the care plan section) Acute Rehab OT Goals Patient Stated Goal: to go home as soon as possible OT Goal Formulation: With patient/family Time For Goal Achievement: 05/09/16 Potential to Achieve Goals: Good ADL Goals Pt Will Perform Grooming: with min assist;standing Pt Will Perform Upper Body Bathing: with min guard assist;sitting Pt Will Perform Lower Body Bathing: with min assist;sit to/from stand Pt Will Transfer to Toilet: with min assist;ambulating;bedside commode Additional ADL Goal #1: Pt will demonstrate selective attention with ADL task in a minimally distracting environment with min verbal cues.  OT Frequency: Min 2X/week   Barriers to D/C:            Co-evaluation PT/OT/SLP Co-Evaluation/Treatment: Yes Reason for Co-Treatment: Complexity of the patient's impairments (multi-system involvement);Necessary to address cognition/behavior during functional activity;For patient/therapist safety PT goals addressed during session: Mobility/safety with mobility OT goals addressed during session: ADL's and self-care      End of Session Equipment Utilized During Treatment: Gait belt Nurse Communication: Mobility status  Activity Tolerance: Patient tolerated treatment well Patient left: in bed;with call bell/phone within reach;with nursing/sitter in room;with family/visitor present;with restraints reapplied   Time: [id number] OT Time Calculation (min): 26  min Charges:  OT General Charges $OT Visit: 1 Procedure OT Evaluation $OT Eval Moderate Complexity: 1 Procedure G-Codes:  Binnie Kand M.S., OTR/L Pager: [phone]  04/25/2016, 1:58 PM

## 2016-04-26 ENCOUNTER — Inpatient Hospital Stay (HOSPITAL_COMMUNITY): Payer: Medicare Other | Admitting: Physical Therapy

## 2016-04-26 ENCOUNTER — Inpatient Hospital Stay (HOSPITAL_COMMUNITY): Payer: Medicare Other | Admitting: Speech Pathology

## 2016-04-26 ENCOUNTER — Inpatient Hospital Stay (HOSPITAL_COMMUNITY): Payer: Medicare Other | Admitting: Occupational Therapy

## 2016-04-26 LAB — CBC WITH DIFFERENTIAL/PLATELET
BASOS PCT: 0 %
Basophils Absolute: 0 10*3/uL (ref 0.0–0.1)
EOS ABS: 0.4 10*3/uL (ref 0.0–0.7)
EOS PCT: 5 %
HCT: 38.2 % — ABNORMAL LOW (ref 39.0–52.0)
Hemoglobin: 12.8 g/dL — ABNORMAL LOW (ref 13.0–17.0)
LYMPHS ABS: 1.4 10*3/uL (ref 0.7–4.0)
Lymphocytes Relative: 17 %
MCH: 31.1 pg (ref 26.0–34.0)
MCHC: 33.5 g/dL (ref 30.0–36.0)
MCV: 92.7 fL (ref 78.0–100.0)
Monocytes Absolute: 0.8 10*3/uL (ref 0.1–1.0)
Monocytes Relative: 10 %
NEUTROS PCT: 68 %
Neutro Abs: 5.9 10*3/uL (ref 1.7–7.7)
PLATELETS: 239 10*3/uL (ref 150–400)
RBC: 4.12 MIL/uL — AB (ref 4.22–5.81)
RDW: 13.7 % (ref 11.5–15.5)
WBC: 8.6 10*3/uL (ref 4.0–10.5)

## 2016-04-26 LAB — COMPREHENSIVE METABOLIC PANEL
ALBUMIN: 2.8 g/dL — AB (ref 3.5–5.0)
ALT: 23 U/L (ref 17–63)
ANION GAP: 9 (ref 5–15)
AST: 24 U/L (ref 15–41)
Alkaline Phosphatase: 48 U/L (ref 38–126)
BUN: 24 mg/dL — ABNORMAL HIGH (ref 6–20)
CHLORIDE: 107 mmol/L (ref 101–111)
CO2: 25 mmol/L (ref 22–32)
Calcium: 9 mg/dL (ref 8.9–10.3)
Creatinine, Ser: 1.23 mg/dL (ref 0.61–1.24)
GFR calc non Af Amer: 50 mL/min — ABNORMAL LOW (ref >60)
GFR, EST AFRICAN AMERICAN: 58 mL/min — AB (ref >60)
GLUCOSE: 153 mg/dL — AB (ref 65–99)
Potassium: 3.3 mmol/L — ABNORMAL LOW (ref 3.5–5.1)
SODIUM: 141 mmol/L (ref 135–145)
Total Bilirubin: 0.3 mg/dL (ref 0.3–1.2)
Total Protein: 5.7 g/dL — ABNORMAL LOW (ref 6.5–8.1)

## 2016-04-26 MED ORDER — POTASSIUM CHLORIDE CRYS ER 20 MEQ PO TBCR
20.0000 meq | EXTENDED_RELEASE_TABLET | Freq: Every day | ORAL | Status: AC
  Administered 2016-04-26 – 2016-04-28 (×3): 20 meq via ORAL
  Filled 2016-04-26 (×3): qty 1

## 2016-04-26 NOTE — IPOC Note (Signed)
Overall Plan of Care [facility]) Patient Details Name: Larry Ortega MRN: [id number] DOB: 09-Feb-1924  Admitting Diagnosis: Encephalopathy  Hospital Problems: Principal Problem:   Encephalopathy Active Problems:   Essential hypertension   Seizures ([place])     Functional Problem List: Nursing Behavior, Bowel, Edema, Endurance, Medication Management, Motor, Nutrition, Pain, Perception, Safety, Sensory, Skin Integrity  PT Balance, Behavior, Endurance, Motor, Safety, Sensory  OT Balance, Cognition, Endurance, Motor, Safety  SLP Behavior, Cognition, Linguistic, Nutrition, Safety  TR         Basic ADL's: OT Grooming, Bathing, Dressing, Toileting     Advanced  ADL's: OT Simple Meal Preparation     Transfers: PT Bed Mobility, Bed to Chair, Car, [employer], Futures trader, Metallurgist: PT Ambulation, Emergency planning/management officer, Stairs     Additional Impairments: OT    SLP Communication, Social Cognition expression Problem Solving, Memory, Attention, Awareness, Social Interaction  TR      Anticipated Outcomes Item Anticipated Outcome  Self Feeding Mod I   Swallowing  mod I   Basic self-care  Supervision  Toileting  Supervision   Bathroom Transfers Supervision  Bowel/Bladder  Pt will remain continent of bowel and bladder with min assist   Transfers  supervision  Locomotion  supervision   Communication  mod I  Cognition  min A  Pain  Pt will rate pain at 4 or less on a scale of 0-10.   Safety/Judgment  Pt will remain free of injury and falls with mod assist    Therapy Plan: PT Intensity: Minimum of 1-2 x/day ,45 to 90 minutes PT Frequency: 5 out of 7 days PT Duration Estimated Length of Stay: 7-10 days OT Intensity: Minimum of 1-2 x/day, 45 to 90 minutes OT Frequency: 5 out of 7 days OT Duration/Estimated Length of Stay: 7-10 days SLP Intensity: Minumum of 1-2 x/day, 30 to 90 minutes SLP Frequency: 3 to 5 out of 7 days SLP Duration/Estimated Length of  Stay: 7-10 days       Team Interventions: Nursing Interventions Patient/Family Education, Bowel Management, Disease Management/Prevention, Cognitive Remediation/Compensation, Medication Management, Pain Management, Skin Care/Wound Management, Dysphagia/Aspiration Precaution Training, Discharge Planning, Psychosocial Support  PT interventions Ambulation/gait training, Training and development officer, Cognitive remediation/compensation, Community reintegration, Discharge planning, Disease management/prevention, DME/adaptive equipment instruction, Functional mobility training, Neuromuscular re-education, Pain management, Patient/family education, Psychosocial support, Stair training, Therapeutic Activities, Therapeutic Exercise, UE/LE Strength taining/ROM, UE/LE Coordination activities  OT Interventions Balance/vestibular training, Discharge planning, Self Care/advanced ADL retraining, Therapeutic Activities, UE/LE Coordination activities, Cognitive remediation/compensation, Functional mobility training, Patient/family education, Therapeutic Exercise, Community reintegration, Engineer, drilling, Neuromuscular re-education, Psychosocial support, UE/LE Strength taining/ROM  SLP Interventions Cognitive remediation/compensation, Dysphagia/aspiration precaution training, Speech/Language facilitation, Cueing hierarchy, Environmental controls, Functional tasks, Multimodal communication approach, Patient/family education  TR Interventions    SW/CM Interventions Discharge Planning, Psychosocial Support, Patient/Family Education    Team Discharge Planning: Destination: PT-Home ,OT- Home , SLP-Home Projected Follow-up: PT-Outpatient PT, 24 hour supervision/assistance, OT-  Outpatient OT, SLP-Outpatient SLP Projected Equipment Needs: PT-To be determined, OT- To be determined, SLP-None recommended by SLP Equipment Details: PT-owns several canes, OT-  Patient/family involved in discharge planning: PT-  Patient, Family member/caregiver,  OT-Patient, SLP-Patient  MD ELOS: 7-10 days Medical Rehab Prognosis:  Excellent Assessment: The patient has been admitted for CIR therapies with the diagnosis of encephalopathy/seizure. The team will be addressing functional mobility, strength, stamina, balance, safety, adaptive techniques and equipment, self-care, bowel and bladder mgt, patient and caregiver education, cognition, communication, NMR, community reintegration, dysphagia.  Goals have been set at supervision for mobility and self-care, min assist for cognition, mod I for swallowing. Pt very active PTA.   Meredith Staggers, MD, FAAPMR      See Team Conference Notes for weekly updates to the plan of care

## 2016-04-26 NOTE — Care Management Note (Signed)
Inpatient [facility] Individual Statement of Services  Patient Name:  Larry Ortega  Date:  04/26/2016  Welcome to the [facility].  Our goal is to provide you with an individualized program based on your diagnosis and situation, designed to meet your specific needs.  With this comprehensive rehabilitation program, you will be expected to participate in at least 3 hours of rehabilitation therapies Monday-Friday, with modified therapy programming on the weekends.  Your rehabilitation program will include the following services:  Physical Therapy (PT), Occupational Therapy (OT), Speech Therapy (ST), 24 hour per day rehabilitation nursing, Therapeutic Recreaction (TR), Neuropsychology, Case Management (Social Worker), Rehabilitation Medicine, Nutrition Services and Pharmacy Services  Weekly team conferences will be held on Tuesdays to discuss your progress.  Your Social Worker will talk with you frequently to get your input and to update you on team discussions.  Team conferences with you and your family in attendance may also be held.  Expected length of stay: 10 days  Overall anticipated outcome: supervision  Depending on your progress and recovery, your program may change. Your Social Worker will coordinate services and will keep you informed of any changes. Your Social Worker's name and contact numbers are listed  below.  The following services may also be recommended but are not provided by the [facility] will be made to provide these services after discharge if needed.  Arrangements include referral to agencies that provide these services.  Your insurance has been verified to be:  Medicare and [employer] Your primary doctor is:  Dr. Ardeth Perfect  Pertinent information will be shared with your doctor and your insurance  company.  Social Worker:  [place], [place] or ([phone]   Information discussed with and copy given to patient by: Lennart Pall, 04/26/2016, 2:49 PM

## 2016-04-26 NOTE — Progress Notes (Signed)
Meredith Staggers, MD Physician Signed Physical Medicine and Rehabilitation  Consult Note Date of Service: 04/24/2016 3:55 PM  Related encounter: ED to Hosp-Admission (Discharged) from 04/20/2016 in [facility] ICU     Expand All Collapse All   [] Hide copied text [] Hover for attribution information      Physical Medicine and Rehabilitation Consult  Reason for Consult: Encephalopathy with seizures, cognitive deficits and balance deficitss Referring Physician: Dr. Titus Mould.    HPI: Larry Ortega is a 82 y.o. male with history of CKD, lumbar stenosis with spondylolisthesis and LLE radiculopathy, B-CAS who was admitted on 04/20/16 with confusion, dizziness and facial droop. CT head negative for acute changes.  Neurology consulted and felt that patient with symptoms likely due to [name] but family deferred t-PA or CTA as patient without motor deficits.  Patient developed seizure activity X 2 while in ED and was loaded with Keppra and intubated for airway protection. LP done and patient started on antibiotics and antiviral due ot concerns of meningitis. 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  Carotid dopplers showed severe calcific plaque origin of R-ICA/ECA 1-39% and moderate soft plaque throughout CCA with moderate mixed plaque at origin 1-39%.  EEG with nonspecific diffuse cerebral dysfunction.  He tolerated extubation on 08/01 and started on regular diet. Placed on IV acyclovir due to IGG+ but hsv DNA negative---d/ced..  PT evaluation done revealing confusion, significant balance deficits and decreased awareness of deficits. CIR recommended for follow up therapy.    Review of Systems  HENT: Negative for hearing loss.   Eyes: Negative for blurred vision and double vision.  Respiratory: Negative for cough and shortness of breath.   Cardiovascular: Negative for chest pain, palpitations  and leg swelling.  Gastrointestinal: Negative for abdominal pain and heartburn.  Genitourinary: Negative for dysuria.  Musculoskeletal: Negative for myalgias.  Skin: Negative for itching and rash.  Neurological: Positive for speech change, seizures and weakness. Negative for dizziness, tingling and headaches.  Endo/Heme/Allergies: Negative for environmental allergies.  Psychiatric/Behavioral: Negative for suicidal ideas.          Past Medical History:  Diagnosis Date  . Bilateral carotid artery occlusion   . Cancer [facility])    prostate  . Chronic kidney disease   . Hypertensive heart disease without congestive heart failure   . Lumbar spinal stenosis   . Lumbar spondylosis          Past Surgical History:  Procedure Laterality Date  . BOWEL RESECTION    . CHOLECYSTECTOMY    . eyelid surgery      No family history on file.    Social History:  Married-- has supportive family. He retired from [employer] and used to work as a Librarian, academic. He goes to gym every other day and walks on treadmill and uses bike. Used cane occasionally. He reports that he has never smoked. He has never used smokeless tobacco. His alcohol and drug histories are not on file.    Allergies: No Known Allergies          Medications Prior to Admission  Medication Sig Dispense Refill  . aspirin 81 MG tablet Take 81 mg by mouth daily.    . clopidogrel (PLAVIX) 75 MG tablet Take 75 mg by mouth daily.    . metoprolol tartrate (LOPRESSOR) 25 MG tablet Take 25 mg by mouth 2 (two) times daily.    Marland Kitchen omeprazole (PRILOSEC) 20 MG capsule Take  20 mg by mouth daily.    . simvastatin (ZOCOR) 20 MG tablet Take 20 mg by mouth daily.      Home: Home Living Family/patient expects to be discharged to:: Private residence Living Arrangements: Spouse/significant other Available Help at Discharge: Family, Available 24 hours/day Type of Home: [place]: [place] - 2 wheels, [place] -  single point  Functional History: Prior Function Level of Independence: Independent Comments: drives, mows the grass.   Functional Status:  Mobility: Bed Mobility General bed mobility comments: Pt was OOB in the chair.   Transfers Overall transfer level: Needs assistance Equipment used: None Transfers: Sit to/from Stand Sit to Stand: Min assist, Mod assist General transfer comment: Min, up to mod assist to adjust for balance during transition to stand, verbal cues for safe hand placement and assist to stabilize the recliner chair as he was leaning his lower legs heavily on chair for support to get to standing.  Once standing increased assist needed to stabilize for balance.  Ambulation/Gait General Gait Details: NT as pt is not safe to walk without second person assisting.     ADL:    Cognition: Cognition Overall Cognitive Status: Impaired/Different from baseline Orientation Level: Oriented to person, Oriented to place Cognition Arousal/Alertness: Awake/alert Behavior During Therapy: Impulsive Overall Cognitive Status: Impaired/Different from baseline Area of Impairment: Orientation, Attention, Memory, Following commands, Safety/judgement, Awareness, Problem solving Orientation Level: Disoriented to, Place, Situation Current Attention Level: Sustained Memory: Decreased short-term memory (does not remember events surrounding his admission) Following Commands: Follows one step commands consistently Safety/Judgement: Decreased awareness of safety, Decreased awareness of deficits Awareness: Intellectual Problem Solving: Difficulty sequencing, Requires verbal cues, Requires tactile cues General Comments: "Which way are you leaning?" (pt is leaning to the far left in standing "Backwards" if anything he is actually leaning left and anteriorly.     Blood pressure (!) 184/78, pulse 67, temperature 98.5 F (36.9 C), temperature source Oral, resp. rate (!) 21, height 6' (1.829 m),  weight 70.6 kg (155 lb 10.3 oz), SpO2 100 %. Physical Exam  Nursing note and vitals reviewed. Constitutional: He appears well-developed and well-nourished.  HENT:  Head: Normocephalic and atraumatic.  Mouth/Throat: Oropharynx is clear and moist.  Eyes: Conjunctivae are normal. Pupils are equal, round, and reactive to light. Right eye exhibits no discharge. Left eye exhibits no discharge.  Neck: Normal range of motion. Neck supple.  Cardiovascular: Normal rate and regular rhythm.   Respiratory: Effort normal and breath sounds normal. No respiratory distress. He has no wheezes.  GI: Soft. Bowel sounds are normal. He exhibits no distension. There is no tenderness.  Neurological: He is alert.  Elderly male with mild expressive deficits. Oriented to self and place.  Able to state date (missed # by one), answer basic biographic questions. He was able to follow simple one and two step motor commands. Moves all four equally. Denies sensory deficits. impulsive  Skin: Skin is warm and dry. No rash noted. No erythema.  Psychiatric: He has a normal mood and affect. His behavior is normal.    Lab Results Last 24 Hours       Results for orders placed or performed during the hospital encounter of 04/20/16 (from the past 24 hour(s))  Magnesium     Status: None   Collection Time: 04/23/16  4:56 PM  Result Value Ref Range   Magnesium 1.8 1.7 - 2.4 mg/dL  Phosphorus     Status: None   Collection Time: 04/23/16  4:56 PM  Result  Value Ref Range   Phosphorus 3.0 2.5 - 4.6 mg/dL  Glucose, capillary     Status: Abnormal   Collection Time: 04/23/16  7:47 PM  Result Value Ref Range   Glucose-Capillary 108 (H) 65 - 99 mg/dL  Basic metabolic panel     Status: Abnormal   Collection Time: 04/23/16  8:20 PM  Result Value Ref Range   Sodium 140 135 - 145 mmol/L   Potassium 3.5 3.5 - 5.1 mmol/L   Chloride 112 (H) 101 - 111 mmol/L   CO2 23 22 - 32 mmol/L   Glucose, Bld 109 (H) 65 - 99 mg/dL    BUN 14 6 - 20 mg/dL   Creatinine, Ser 1.26 (H) 0.61 - 1.24 mg/dL   Calcium 8.4 (L) 8.9 - 10.3 mg/dL   GFR calc non Af Amer 48 (L) >60 mL/min   GFR calc Af Amer 56 (L) >60 mL/min   Anion gap 5 5 - 15  Glucose, capillary     Status: Abnormal   Collection Time: 04/23/16 11:42 PM  Result Value Ref Range   Glucose-Capillary 100 (H) 65 - 99 mg/dL  Glucose, capillary     Status: None   Collection Time: 04/24/16  3:35 AM  Result Value Ref Range   Glucose-Capillary 93 65 - 99 mg/dL  Triglycerides     Status: Abnormal   Collection Time: 04/24/16  4:40 AM  Result Value Ref Range   Triglycerides 176 (H) <150 mg/dL  CBC with Differential/Platelet     Status: Abnormal   Collection Time: 04/24/16  4:40 AM  Result Value Ref Range   WBC 7.6 4.0 - 10.5 K/uL   RBC 4.07 (L) 4.22 - 5.81 MIL/uL   Hemoglobin 12.4 (L) 13.0 - 17.0 g/dL   HCT 37.6 (L) 39.0 - 52.0 %   MCV 92.4 78.0 - 100.0 fL   MCH 30.5 26.0 - 34.0 pg   MCHC 33.0 30.0 - 36.0 g/dL   RDW 13.9 11.5 - 15.5 %   Platelets 220 150 - 400 K/uL   Neutrophils Relative % 71 %   Neutro Abs 5.4 1.7 - 7.7 K/uL   Lymphocytes Relative 15 %   Lymphs Abs 1.1 0.7 - 4.0 K/uL   Monocytes Relative 10 %   Monocytes Absolute 0.8 0.1 - 1.0 K/uL   Eosinophils Relative 4 %   Eosinophils Absolute 0.3 0.0 - 0.7 K/uL   Basophils Relative 0 %   Basophils Absolute 0.0 0.0 - 0.1 K/uL  Magnesium     Status: None   Collection Time: 04/24/16  4:40 AM  Result Value Ref Range   Magnesium 2.0 1.7 - 2.4 mg/dL  Phosphorus     Status: None   Collection Time: 04/24/16  4:40 AM  Result Value Ref Range   Phosphorus 3.2 2.5 - 4.6 mg/dL  Comprehensive metabolic panel     Status: Abnormal   Collection Time: 04/24/16  4:40 AM  Result Value Ref Range   Sodium 142 135 - 145 mmol/L   Potassium 3.7 3.5 - 5.1 mmol/L   Chloride 111 101 - 111 mmol/L   CO2 24 22 - 32 mmol/L   Glucose, Bld 108 (H) 65 - 99 mg/dL   BUN 14 6 - 20 mg/dL    Creatinine, Ser 1.20 0.61 - 1.24 mg/dL   Calcium 8.4 (L) 8.9 - 10.3 mg/dL   Total Protein 5.7 (L) 6.5 - 8.1 g/dL   Albumin 2.8 (L) 3.5 - 5.0 g/dL   AST 18  15 - 41 U/L   ALT 18 17 - 63 U/L   Alkaline Phosphatase 51 38 - 126 U/L   Total Bilirubin 0.5 0.3 - 1.2 mg/dL   GFR calc non Af Amer 51 (L) >60 mL/min   GFR calc Af Amer 60 (L) >60 mL/min   Anion gap 7 5 - 15  Glucose, capillary     Status: Abnormal   Collection Time: 04/24/16  8:12 AM  Result Value Ref Range   Glucose-Capillary 112 (H) 65 - 99 mg/dL  Glucose, capillary     Status: Abnormal   Collection Time: 04/24/16 11:28 AM  Result Value Ref Range   Glucose-Capillary 189 (H) 65 - 99 mg/dL     Imaging Results (Last 48 hours)  No results found.    Assessment/Plan: Diagnosis: encephalopathy/seizures/gait disorder 1. Does the need for close, 24 hr/day medical supervision in concert with the patient's rehab needs make it unreasonable for this patient to be served in a less intensive setting? Yes 2. Co-Morbidities requiring supervision/potential complications: fever, sz rx, cognition 3. Due to bladder management, bowel management, safety, skin/wound care, disease management, medication administration, pain management and patient education, does the patient require 24 hr/day rehab nursing? Yes 4. Does the patient require coordinated care of a physician, rehab nurse, PT (1-2 hrs/day, 5 days/week), OT (1-2 hrs/day, 5 days/week) and SLP (1-2 hrs/day, 5 days/week) to address physical and functional deficits in the context of the above medical diagnosis(es)? Yes Addressing deficits in the following areas: balance, endurance, locomotion, strength, transferring, bowel/bladder control, bathing, dressing, feeding, grooming, toileting, cognition and psychosocial support 5. Can the patient actively participate in an intensive therapy program of at least 3 hrs of therapy per day at least 5 days per week? Yes 6. The potential for  patient to make measurable gains while on inpatient rehab is excellent 7. Anticipated functional outcomes upon discharge from inpatient rehab are modified independent  with PT, modified independent with OT, modified independent, supervision and n/a with SLP. 8. Estimated rehab length of stay to reach the above functional goals is: 7-10 days 9. Does the patient have adequate social supports and living environment to accommodate these discharge functional goals? Yes 10. Anticipated D/C setting: Home 11. Anticipated post D/C treatments: HH therapy and Outpatient therapy 12. Overall Rehab/Functional Prognosis: excellent  RECOMMENDATIONS: This patient's condition is appropriate for continued rehabilitative care in the following setting: CIR Patient has agreed to participate in recommended program. Yes and Potentially Note that insurance prior authorization may be required for reimbursement for recommended care.  Comment: Rehab Admissions Coordinator to follow up.  Thanks,  Meredith Staggers, MD, [facility]     04/24/2016    Revision History                        Routing History

## 2016-04-26 NOTE — Progress Notes (Signed)
Social Work  Social Work Assessment and Plan  Patient Details  Name: Larry Ortega MRN: [id number] Date of Birth: 01-Aug-1924  Today's Date: 04/26/2016  Problem List:  Patient Active Problem List   Diagnosis Date Noted  . Encephalopathy 04/25/2016  . Essential hypertension 04/25/2016  . Seizures ([place]) 04/25/2016  . Aphasia   . Acute respiratory failure ([place])   . Cerebral infarction due to unspecified mechanism 04/20/2016  . Cerebrovascular accident (CVA) due to thrombosis of precerebral artery ([place]) 04/20/2016  . Cerebral infarction due to thrombosis of precerebral artery ([place])   . Carotid stenosis 09/26/2015   Past Medical History:  Past Medical History:  Diagnosis Date  . Bilateral carotid artery occlusion   . Cancer [facility])    prostate  . Chronic kidney disease   . Hypertensive heart disease without congestive heart failure   . Lumbar spinal stenosis   . Lumbar spondylosis    Past Surgical History:  Past Surgical History:  Procedure Laterality Date  . BOWEL RESECTION    . CHOLECYSTECTOMY    . eyelid surgery     Social History:  reports that he has never smoked. He has never used smokeless tobacco. His alcohol and drug histories are not on file.  Family / Support Systems Marital Status: Married Patient Roles: Spouse, Parent Spouse/Significant Other: wife, [name] @  (C) [phone] Children: daughter, [name] (and husband, [name]) -[place] @ (C) [phone];  son, [name] Anticipated Caregiver: Daughter and son-in-law Ability/Limitations of Caregiver: Spouse can provide Supervision assist only Caregiver Availability: 24/7 Family Dynamics: Pt and wife describe family and friends as very supportive.  Wife very encouraging to pt during interview and involved in process.  Social History Preferred language: English Religion: None Cultural Background: NA Read: Yes Write: Yes Employment Status: Retired Freight forwarder Issues:  None Guardian/Conservator: None - per MD, pt is not capable of making decisions on his own behalf.  Defer to wife.   Abuse/Neglect Physical Abuse: Denies Verbal Abuse: Denies Sexual Abuse: Denies Exploitation of patient/patient's resources: Denies Self-Neglect: Denies  Emotional Status Pt's affect, behavior adn adjustment status: Pt attempts to complete assessment interview himself but does require some assist from wife.  He admits that he has no recall of earlier portion of his hospitalization.  He laughs easily with me and wife and denies any significant emotional distress.  There are cognitive impairments evident.  Will refer for neuropsychology next week. Recent Psychosocial Issues: None Pyschiatric History: None Substance Abuse History: None  Patient / Family Perceptions, Expectations & Goals Pt/Family understanding of illness & functional limitations: Pt initially reports that he suffered a stroke.  Wife quickly responds with "now you know that's not right...we just talked about it a few minutes ago...". Pt then correctly reports that he had "a seizure:"  Pt admits to very poor memore of events.  Wife reports that she feels pt is "getting better everyday".  I am concerned that wife does not appear to have a full appreciation of pt's cognitive deficits.  When I mentioned diagnosis of encephalopathy, wife states, "No, the doctors ruled that out." Premorbid pt/family roles/activities: Pt and wife report they were both very active PTA and going to the gym 3x/wk.  Pt still driving. Anticipated changes in roles/activities/participation: Dependent on cognitive improvements, wife will assume primary caregiver role.   Pt/family expectations/goals: Per pt, "I just hope I can start remembering stuff."  [employer]: None Premorbid Home Care/DME Agencies: None Resource referrals recommended: Neuropsychology  Discharge Planning  Living Arrangements: Spouse/significant  other Support Systems: Spouse/significant other, Children, Friends/neighbors, Church/faith community Type of Residence: Private residence Insurance Resources: [employer], Nurse, mental health (specify name) ([employer]) Financial Resources: Social Security Financial Screen Referred: No Living Expenses: Own Money Management: Patient, Spouse Does the patient have any problems obtaining your medications?: No Home Management: pt and spouse Patient/Family Preliminary Plans: Pt to d/c home with wife as primary caregiver and intermittent support of daughter/ son-in-law and friends. Social Work Anticipated Follow Up Needs: HH/OP Expected length of stay: 7-10 days  Clinical Impression Pleasant, elderly gentleman here following seizures with suspect past CVAs and now with encephalopathy.  Pt and wife report he was completely independent PTA.  Pt with some cognitive impairment and wife seems to minimize this in conversation.  Hoping to reach a supervision level of care as that is all wife can provide.  Will follow for support and d/c planning needs.  [name] 04/26/2016, 3:11 PM

## 2016-04-26 NOTE — Progress Notes (Signed)
Patient information reviewed and entered into eRehab system by Elza Sortor, RN, CRRN, PPS Coordinator.  Information including medical coding and functional independence measure will be reviewed and updated through discharge.     Per nursing patient was given "Data Collection Information Summary for Patients in Inpatient Rehabilitation Facilities with attached "Privacy Act Statement-Health Care Records" upon admission.  

## 2016-04-26 NOTE — Evaluation (Signed)
Occupational Therapy Assessment and Plan  Patient Details  Name: Larry Ortega MRN: [id number] Date of Birth: 1924/06/15  OT Diagnosis: abnormal posture, ataxia, cognitive deficits and muscle weakness (generalized) Rehab Potential: Rehab Potential (ACUTE ONLY): Excellent ELOS: 7-10 days   Today's Date: 04/26/2016 OT Individual Time: 1300-1415 OT Individual Time Calculation (min): 75 min      Problem List: Patient Active Problem List   Diagnosis Date Noted  . Encephalopathy 04/25/2016  . Essential hypertension 04/25/2016  . Seizures ([place]) 04/25/2016  . Aphasia   . Acute respiratory failure ([place])   . Cerebral infarction due to unspecified mechanism 04/20/2016  . Cerebrovascular accident (CVA) due to thrombosis of precerebral artery ([place]) 04/20/2016  . Cerebral infarction due to thrombosis of precerebral artery ([place])   . Carotid stenosis 09/26/2015    Past Medical History:  Past Medical History:  Diagnosis Date  . Bilateral carotid artery occlusion   . Cancer [facility])    prostate  . Chronic kidney disease   . Hypertensive heart disease without congestive heart failure   . Lumbar spinal stenosis   . Lumbar spondylosis    Past Surgical History:  Past Surgical History:  Procedure Laterality Date  . BOWEL RESECTION    . CHOLECYSTECTOMY    . eyelid surgery      Assessment & Plan Clinical Impression: Larry Ortega a 82 y.o.malewith history of CKD, lumbar stenosis with spondylolisthesis and LLE radiculopathy, B-CAS who was admitted on 04/20/16 with confusion, dizziness and facial droop. CT head negative for acute changes. Neurology consulted and felt that patient with symptoms likely due to [name] but family deferred t-PA or CTA as patient without motor deficits. Patient developed seizure activity X 2 while in ED and was loaded with Keppra and intubated for airway protection. LP done and patient started on antibiotics and antiviral due ot concerns of meningitis. MRI/MRA  brain done showing chronic right frontal and right cerebellar infarcts with no acute abnormality. Carotid dopplers showed severe calcific plaque origin of R-ICA/ECA 1-39% and moderate soft plaque throughout CCA with moderate mixed plaque at origin 1-39%. EEG with nonspecific diffuse cerebral dysfunction. He tolerated extubation on 08/01 and started on regular diet. LP with elevated protein and he was placedon IV acyclovir due to concerns of herpes encephalitis.  IGG+ but hsv DNA negative therefore acyclovir d/ced. Neurology felt that seizures were due to underlying cerebrovascular disease and to continue [place]. He continues to have confusion at nights and sustained a fall early his am--repeat CT head done showing stable right frontal encephalomalacia and old left BC infarct--no acute changes.  PT evaluation done revealing confusion, significant balance deficits and decreased awareness of deficits.Patient transferred to CIR on 04/25/2016 .   Patient currently requires min-mod A with basic self-care skills secondary to muscle weakness, decreased cardiorespiratoy endurance, ataxia and decreased coordination and decreased sitting balance, decreased standing balance, decreased postural control and decreased balance strategies.  Prior to hospitalization, patient could complete ADLs/IADLs with independent .  Patient will benefit from skilled intervention to increase independence with basic self-care skills and increase level of independence with iADL prior to discharge home with care partner.  Anticipate patient will require 24 hour supervision and follow up outpatient.  OT - End of Session Activity Tolerance: Tolerates 10 - 20 min activity with multiple rests Endurance Deficit: Yes Endurance Deficit Description: Pt get SOB, but unable to verbalize need for rest break OT Assessment Rehab Potential (ACUTE ONLY): Excellent OT Patient demonstrates impairments in the following area(s):  Balance;Cognition;Endurance;Motor;Safety  OT Basic ADL's Functional Problem(s): Grooming;Bathing;Dressing;Toileting OT Advanced ADL's Functional Problem(s): Simple Meal Preparation OT Transfers Functional Problem(s): Toilet;Tub/Shower OT Plan OT Intensity: Minimum of 1-2 x/day, 45 to 90 minutes OT Frequency: 5 out of 7 days OT Duration/Estimated Length of Stay: 7-10 days OT Treatment/Interventions: Balance/vestibular training;Discharge planning;Self Care/advanced ADL retraining;Therapeutic Activities;UE/LE Coordination activities;Cognitive remediation/compensation;Functional mobility training;Patient/family education;Therapeutic Exercise;Community reintegration;DME/adaptive equipment instruction;Neuromuscular re-education;Psychosocial support;UE/LE Strength taining/ROM OT Self Feeding Anticipated Outcome(s): Mod I  OT Basic Self-Care Anticipated Outcome(s): Supervision OT Toileting Anticipated Outcome(s): Supervision OT Bathroom Transfers Anticipated Outcome(s): Supervision OT Recommendation Patient destination: Home Follow Up Recommendations: Outpatient OT Equipment Recommended: To be determined  Skilled Therapeutic Intervention Pt seen for skilled OT session focusing on self care and evaluation. Pt sitting in recliner with wife and family present upon arrival (left during session and came back during session). Pt ambulated throughout session with min-mod A for balance often needing VC for safety. Pt ambulated to shower and completed undressing/dressing and shower sitting on shower chair. Pt required increased reminders to stay sitting and request help when needing to stand. Pt required increased assist for LB bathing/dressing due to decreased balance/strength. Pt completed grooming standing at the sink needing frequent rest breaks, but unable to initiate on own. Pt practiced standing with min-mod A reaching in all planes to increase dynamic standing balance. Pt left sitting in recliner with  quick release belt donned, all needs met and wife and daughter present. Throughout session pt perseverated on "chair, blue" from being asked sock, blue, bed as part of BIMS.  During session OT educated on energy conservation, d/c planning and continuum of care. OT educated intensively to use call bell and not to get up without assist from staff.   OT Evaluation Precautions/Restrictions  Precautions Precautions: Fall Restrictions Weight Bearing Restrictions: No Pain Pain Assessment Pain Assessment: No/denies pain Home Living/Prior Functioning Home Living Family/patient expects to be discharged to:: Private residence Living Arrangements: Spouse/significant other Available Help at Discharge: Family, Available 24 hours/day Type of Home: House Home Access: Stairs to enter Home Layout: Multi-level, Able to live on main level with bedroom/bathroom Bathroom Shower/Tub: Tub/shower unit, Architectural technologist: Standard Bathroom Accessibility: Yes Additional Comments: Bedroom/bathroom pt currently uses is upstair, but there is a bedroom/bathroom pt can use on main level  Lives With: Spouse IADL History Homemaking Responsibilities: No Current License: Yes Mode of Transportation: Musician Occupation: Retired Type of Occupation: 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, community delivery guy Leisure and Hobbies: Golf and gonig to the gym  Prior Function Level of Independence: Independent with basic ADLs  Able to [place]?: Yes Driving: Yes Vocation: Retired Leisure: Hobbies-yes (Comment) Comments: Drive, goes the gym and enjoys to [employer] the yard Vision/Perception  Vision- History Baseline Vision/History: Wears glasses Wears Glasses: Reading only Patient Visual Report: No change from baseline Vision- Assessment Vision Assessment?: No apparent visual deficits  Cognition Overall Cognitive Status: Impaired/Different from baseline Arousal/Alertness: Awake/alert Orientation Level: Person Year:  2017 Month: August Day of Week: Incorrect Memory: Impaired Memory Impairment: Decreased recall of new information;Decreased short term memory;Storage deficit;Retrieval deficit Decreased Short Term Memory: Verbal basic;Functional basic Immediate Memory Recall: Blue;Sock;Bed Memory Recall: Blue Memory Recall Blue: Without Cue Attention: Selective Selective Attention: Impaired Selective Attention Impairment: Functional complex Awareness: Impaired (Pt not fully aware of deficits) Awareness Impairment: Intellectual impairment Problem Solving: Impaired Problem Solving Impairment: Verbal basic Executive Function:  (all impaired) Behaviors: Impulsive;Perseveration Safety/Judgment: Impaired Comments: Pt is impulsive and unaware of deficits. He asked if he could mow the grass.  Sensation Sensation Light Touch: Appears Intact Stereognosis: Appears Intact Hot/Cold: Appears Intact Proprioception: Appears Intact Coordination Gross Motor Movements are Fluid and Coordinated: No Fine Motor Movements are Fluid and Coordinated: Yes Coordination and Movement Description: L lean and walks with shuffling steps Motor  Motor Motor: Ataxia Motor - Skilled Clinical Observations: Pt with ataxic gate due to L lean and decreased strength  Trunk/Postural Assessment  Cervical Assessment Cervical Assessment: Within Functional Limits Thoracic Assessment Thoracic Assessment: Within Functional Limits Lumbar Assessment Lumbar Assessment: Within Functional Limits Postural Control Postural Control: Deficits on evaluation (Pt struggles to maintain upright posture during dynamic standing activities)  Balance Balance Balance Assessed: Yes Static Sitting Balance Static Sitting - Balance Support: Bilateral upper extremity supported;Feet supported Static Sitting - Level of Assistance: 4: Min assist Dynamic Sitting Balance Dynamic Sitting - Level of Assistance: 4: Min assist Dynamic Sitting - Balance  Activities: Lateral lean/weight shifting;Reaching for objects Static Standing Balance Static Standing - Level of Assistance: 3: Mod assist Dynamic Standing Balance Dynamic Standing - Level of Assistance: 3: Mod assist Extremity/Trunk Assessment RUE Assessment RUE Assessment: Within Functional Limits LUE Assessment LUE Assessment: Within Functional Limits   See Function Navigator for Current Functional Status.   Refer to Care Plan for Long Term Goals  Recommendations for other services: None  Discharge Criteria: Patient will be discharged from OT if patient refuses treatment 3 consecutive times without medical reason, if treatment goals not met, if there is a change in medical status, if patient makes no progress towards goals or if patient is discharged from hospital.  The above assessment, treatment plan, treatment alternatives and goals were discussed and mutually agreed upon: by patient  Matilde Bash 04/26/2016, 2:56 PM

## 2016-04-26 NOTE — Progress Notes (Signed)
[facility] PHYSICAL MEDICINE & REHABILITATION     PROGRESS NOTE    Subjective/Complaints: Had a fair night. Working with therapy this morning already. Denies pain.   ROS: Pt denies fever, rash/itching, headache, blurred or double vision, nausea, vomiting, abdominal pain, diarrhea, chest pain, shortness of breath, palpitations, dysuria, dizziness, neck or back pain, bleeding, anxiety, or depression   Objective: Vital Signs: Blood pressure (!) 132/52, pulse 66, temperature 97.7 F (36.5 C), temperature source Oral, resp. rate 16, height 5\' 11"  (1.803 m), weight 67.7 kg (149 lb 4 oz), SpO2 95 %. Ct Head Wo Contrast  Result Date: 04/25/2016 CLINICAL DATA:  [place], hitting head on floor. EXAM: CT HEAD WITHOUT CONTRAST TECHNIQUE: Contiguous axial images were obtained from the base of the skull through the vertex without intravenous contrast. COMPARISON:  MRI of the head April 20, 2016 FINDINGS: INTRACRANIAL CONTENTS: The ventricles and sulci are normal for age. No intraparenchymal hemorrhage, mass effect nor midline shift. Patchy supratentorial white matter hypodensities are within normal range for patient's age and though non-specific likely represent chronic small vessel ischemic disease. Old LEFT basal ganglia lacunar infarct. Small area RIGHT frontal encephalomalacia. No acute large vascular territory infarcts. No abnormal extra-axial fluid collections. Basal cisterns are patent. Severe calcific atherosclerosis of the carotid siphons. ORBITS: The included ocular globes and orbital contents are non-suspicious. Status post bilateral ocular lens implants. SINUSES: The mastoid aircells and included paranasal sinuses are well-aerated. SKULL/SOFT TISSUES: Small RIGHT parietal scalp hematoma, no subcutaneous gas or radiopaque foreign bodies. IMPRESSION: No acute intracranial process. Stable appearance of the head including old small RIGHT frontal lobe infarct. Electronically Signed   By: Elon Alas M.D.    On: 04/25/2016 01:39    Recent Labs  04/24/16 0440 04/26/16 0553  WBC 7.6 8.6  HGB 12.4* 12.8*  HCT 37.6* 38.2*  PLT 220 239    Recent Labs  04/24/16 0440 04/26/16 0553  NA 142 141  K 3.7 3.3*  CL 111 107  GLUCOSE 108* 153*  BUN 14 24*  CREATININE 1.20 1.23  CALCIUM 8.4* 9.0   CBG (last 3)   Recent Labs  04/25/16 0825 04/25/16 1139 04/25/16 1553  GLUCAP 133* 129* 156*    Wt Readings from Last 3 Encounters:  04/26/16 67.7 kg (149 lb 4 oz)  04/25/16 66.3 kg (146 lb 2.6 oz)  04/02/16 72.6 kg (160 lb)    Physical Exam:  Constitutional: He appears well-developed and well-nourished.  Fatigued appearing.  HENT:  Head: Normocephalic and atraumatic.  Mouth/Throat: Oropharynx is clear and moist.  Eyes: EOM are normal. Pupils are equal, round, and reactive to light. Right conjunctiva is injected. Left conjunctiva is injected.  Neck: Normal range of motion. Neck supple.  Cardiovascular: Normal rate and regular rhythm.   No murmur heard. Respiratory: Effort normal and breath sounds normal. No stridor. No respiratory distress. He has no wheezes.  GI: Soft. Bowel sounds are normal. He exhibits no distension. There is no tenderness.  Musculoskeletal: He exhibits no edema or tenderness.  Neurological: He is alert.   Oriented to self and place, month/year. He was able to follow simple one and two step motor commands. Moves all four equally. 4/5 prox to distal in UE's and 3+ to 4/5 in the lower extremities.  Denies sensory deficits--senses pain and light touch in all 4. A little impulsive   Skin: Skin is warm and dry. No erythema.  Psychiatric: His mood appears anxious. His speech is slurred. He is slowed. He expresses impulsivity.  Assessment/Plan: 1. Cognitive, mobility, and functional deficits secondary to encephalopathy which require 3+ hours per day of interdisciplinary therapy in a comprehensive inpatient rehab setting. Physiatrist is providing close team  supervision and 24 hour management of active medical problems listed below. Physiatrist and rehab team continue to assess barriers to discharge/monitor patient progress toward functional and medical goals.  Function:  Bathing Bathing position      Bathing parts      Bathing assist        Upper Body Dressing/Undressing Upper body dressing                    Upper body assist        Lower Body Dressing/Undressing Lower body dressing                                  Lower body assist        Toileting Toileting   Toileting steps completed by patient: Performs perineal hygiene Toileting steps completed by helper: Adjust clothing prior to toileting, Adjust clothing after toileting Toileting Assistive Devices: Grab bar or rail  Toileting assist Assist level: Two helpers   Transfers Chair/bed Physiological scientist Comprehension Comprehension assist level: Understands basic 75 - 89% of the time/ requires cueing 10 - 24% of the time  Expression Expression assist level: Expresses basic 90% of the time/requires cueing < 10% of the time.  Social Interaction Social Interaction assist level: Interacts appropriately with others with medication or extra time (anti-anxiety, antidepressant).  Problem Solving    Memory Memory assist level: Recognizes or recalls 90% of the time/requires cueing < 10% of the time   Medical Problem List and Plan: 1.  Cognitive, mobility and functional deficits secondary to encephalopathy and seizures  -begin CIR therapies 2.  DVT Prophylaxis/Anticoagulation:   Lovenox 3. Pain Management: Tylenol prn. Denies pain at this point 4. Mood: LCSW to follow for evaluation and support.  5. Neuropsych: This patient is not capable of making decisions on his own behalf.                       -pt's bed is close to nurses station to due safety issues                        -minimize restraints as possible                       -normalize sleep wake cycle 6. Skin/Wound Care: Routine pressure relief measures. Maintain adequate nutrition and hydration status.  7. Fluids/Electrolytes/Nutrition: I personally reviewed the patient's labs today.   -mild hypokalemia---replete  -pre-renal azotemia---encourage fluids    8. New onset seizures: On keppra bid.  9. HTN: Monitor BID. Continue metoprolol 10 GERD: Will resume PPI. 11. Altered mental status/delirium:   Seroquel prn for agitation/sundowning.   -monitor sleep-wake pattern  12. Hypertensive heart disease: On ASA/Plavix/Zocor   LOS (Days) 1 A FACE TO FACE EVALUATION WAS PERFORMED  Lamerle Jabs T 04/26/2016 9:02 AM

## 2016-04-26 NOTE — Evaluation (Signed)
Speech Language Pathology Assessment and Plan  Patient Details  Name: Larry Ortega MRN: [id number] Date of Birth: Mar 10, 1924  SLP Diagnosis: Aphasia;Cognitive Impairments;Dysphagia  Rehab Potential: Good ELOS: 7-10 days    Today's Date: 04/26/2016 SLP Individual Time: 0800-0900 SLP Individual Time Calculation (min): 60 min    Problem List: Patient Active Problem List   Diagnosis Date Noted  . Encephalopathy 04/25/2016  . Essential hypertension 04/25/2016  . Seizures ([place]) 04/25/2016  . Aphasia   . Acute respiratory failure ([place])   . Cerebral infarction due to unspecified mechanism 04/20/2016  . Cerebrovascular accident (CVA) due to thrombosis of precerebral artery ([place]) 04/20/2016  . Cerebral infarction due to thrombosis of precerebral artery ([place])   . Carotid stenosis 09/26/2015   Past Medical History:  Past Medical History:  Diagnosis Date  . Bilateral carotid artery occlusion   . Cancer [facility])    prostate  . Chronic kidney disease   . Hypertensive heart disease without congestive heart failure   . Lumbar spinal stenosis   . Lumbar spondylosis    Past Surgical History:  Past Surgical History:  Procedure Laterality Date  . BOWEL RESECTION    . CHOLECYSTECTOMY    . eyelid surgery      Assessment / Plan / Recommendation Clinical Impression 82 year old male with a PMH of carotid stenosis (followed by Dr. Donnetta Hutching) who presented to the ED on 7/29 with confusion and facial droop. That morning around 10:30am, he started feeling dizzy and walked outside. His family noticed he was confused. He walked out to his car, sat inside, and would not get out. He started talking funny and was not making sense. Family called 911. Per family, he has never had a stroke before. At baseline, Pt is very healthy and active. He mows the lawn, takes full care of himself, completes all ADLs independently, takes care of his finances, etc. In the ED, code stroke was called. He was evaluated by Dr.  Tasia Catchings (Neurology) who thought he likely had a L MCA stroke, given his aphasia and right facial droop. CT head showed diffuse atrophy and low density area in the right frontal region that appeared chronic in nature. Around 3:45pm, he had a witnessed seizure in the ED. He was given 82m Ativan with resolution of seizure. PCCM was called for admission due to concern that Pt was unable to protect his airway. He was intubated for airway protection, Keppra was loaded and further stroke evaluation was initiated. He was placed on empiric acyclovir for possible herpes encephalitis. He had an LP performed on 7/30. The results were negative for this. Antivirals were stopped. He was successfully extubated on 8/1. EEGs were negative for further seizure activity.   Pt participated with speech/language/cognition and swallowing assessments which were revealing of deficits primarily related to poor recall and executive functions. These cognitive impairments impact pt's ability to take PO safely unsupervised. Pt requires external structure to regulate behavior. Pt would benefit [facility] and safety upon return home.   Skilled Therapeutic Interventions          Half of initial session spent in therapy. Rehearsal utilized to reinforce orientation information- [facility]. Pt initially required Max A for deficit awareness, but this improved to min A as the session progressed. Pt required min verbal cueing for safety with PO intake- no self-monitoring of oral holding, however pt responded well to verbal directives to slow rate, decrease  bite-size and complete oral clearance.   SLP Assessment  Patient will need skilled Speech Lanaguage Pathology Services during CIR admission    Recommendations  SLP Diet Recommendations: Age appropriate regular solids Liquid Administration via: Cup Medication Administration: Whole meds with  puree Supervision: Patient able to self feed;Full supervision/cueing for compensatory strategies Compensations: Slow rate;Small sips/bites;Minimize environmental distractions Postural Changes and/or Swallow Maneuvers: Seated upright 90 degrees Oral Care Recommendations: Oral care BID Patient destination: Home Follow up Recommendations: Outpatient SLP Equipment Recommended: None recommended by SLP    SLP Frequency 3 to 5 out of 7 days   SLP Duration  SLP Intensity  SLP Treatment/Interventions 7-10 days  Minumum of 1-2 x/day, 30 to 90 minutes  Cognitive remediation/compensation;Dysphagia/aspiration precaution training;Speech/Language facilitation;Cueing hierarchy;Environmental controls;Functional tasks;Multimodal communication approach;Patient/family education    Pain Pain Assessment Pain Assessment: No/denies pain Faces Pain Scale: Hurts a little bit Pain Location: Back Pain Orientation: Lower Pain Descriptors / Indicators: Aching Pain Onset: On-going Pain Intervention(s): Rest  Prior Functioning Cognitive/Linguistic Baseline: Within functional limits Type of Home: House  Lives With: Spouse Available Help at Discharge: Family;Available 24 hours/day Vocation: Retired  Function:  Eating Eating   Modified Consistency Diet: No Eating Assist Level: More than reasonable amount of time;Set up assist for;Supervision or verbal cues   Eating Set Up Assist For: Opening containers;Cutting food       Cognition Comprehension Comprehension assist level: Understands basic 50 - 74% of the time/ requires cueing 25 - 49% of the time  Expression   Expression assist level: Expresses basic 50 - 74% of the time/requires cueing 25 - 49% of the time. Needs to repeat parts of sentences.  Social Interaction Social Interaction assist level: Interacts appropriately 75 - 89% of the time - Needs redirection for appropriate language or to initiate interaction.  Problem Solving Problem solving  assist level: Solves basic less than 25% of the time - needs direction nearly all the time or does not effectively solve problems and may need a restraint for safety  Memory Memory assist level: Recognizes or recalls less than 25% of the time/requires cueing greater than 75% of the time   Short Term Goals: Week 1: SLP Short Term Goal 1 (Week 1): Pt demonstrate O x 4 with mod A.  SLP Short Term Goal 2 (Week 1): Pt tolerate regular diet with use of compensatory strategies to faciliatate oral clearance with min verbal cues.  SLP Short Term Goal 3 (Week 1): Pt to recall new basic information in 5 minute increments with mod A. SLP Short Term Goal 4 (Week 1): Pt to verbalize 2 limitations min A. SLP Short Term Goal 5 (Week 1): Pt complete basic problem solving within functional tasks with mod A.  Refer to Care Plan for Long Term Goals  Recommendations for other services: None  Discharge Criteria: Patient will be discharged from SLP if patient refuses treatment 3 consecutive times without medical reason, if treatment goals not met, if there is a change in medical status, if patient makes no progress towards goals or if patient is discharged from hospital.  The above assessment, treatment plan, treatment alternatives and goals were discussed and mutually agreed upon: by patient  Vinetta Bergamo MA, CCC-SLP 04/26/2016, 11:55 AM

## 2016-04-26 NOTE — Evaluation (Signed)
Physical Therapy Assessment and Plan  Patient Details  Name: Larry Ortega MRN: [id number] Date of Birth: April 06, 1924  PT Diagnosis: Abnormal posture, Abnormality of gait, Ataxic gait, Cognitive deficits, Coordination disorder, Difficulty walking, Impaired sensation and Muscle weakness Rehab Potential: Good ELOS: 7-10 days   Today's Date: 04/26/2016 PT Individual Time: 1030-1130 PT Individual Time Calculation (min): 60 min     Problem List: Patient Active Problem List   Diagnosis Date Noted  . Encephalopathy 04/25/2016  . Essential hypertension 04/25/2016  . Seizures ([place]) 04/25/2016  . Aphasia   . Acute respiratory failure ([place])   . Cerebral infarction due to unspecified mechanism 04/20/2016  . Cerebrovascular accident (CVA) due to thrombosis of precerebral artery ([place]) 04/20/2016  . 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 Carotid stenosis 09/26/2015    Past Medical History:  Past Medical History:  Diagnosis Date  . Bilateral carotid artery occlusion   . Cancer [facility])    prostate  . Chronic kidney disease   . Hypertensive heart disease without congestive heart failure   . Lumbar spinal stenosis   . Lumbar spondylosis    Past Surgical History:  Past Surgical History:  Procedure Laterality Date  . BOWEL RESECTION    . CHOLECYSTECTOMY    . eyelid surgery      Assessment & Plan Clinical Impression:   Larry Ortega a 82 y.o.malewith history of CKD, lumbar stenosis with spondylolisthesis and LLE radiculopathy, B-CAS who was admitted on 04/20/16 with confusion, dizziness and facial droop. CT head negative for acute changes. Neurology consulted and felt that patient with symptoms likely due to [name] but family deferred t-PA or CTA as patient without motor deficits. Patient developed seizure activity X 2 while in ED and was loaded with Keppra and intubated for airway protection. LP done and patient started on antibiotics and antiviral  due ot concerns of meningitis. 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 Carotid dopplers showed severe calcific plaque origin of R-ICA/ECA 1-39% and moderate soft plaque throughout CCA with moderate mixed plaque at origin 1-39%. EEG with nonspecific diffuse cerebral dysfunction. He tolerated extubation on 08/01 and started on regular diet. LP with elevated protein and he was placedon IV acyclovir due to concerns of herpes encephalitis.  IGG+ but hsv DNA negative therefore acyclovir d/ced. Neurology felt that seizures were due to underlying cerebrovascular disease and to continue [place]. He continues to have confusion at nights and sustained a fall early his am--repeat CT head done showing stable right frontal encephalomalacia and old left BC infarct--no acute changes.  PT evaluation done revealing confusion, significant balance deficits and decreased awareness of deficits. CIR recommended for follow up therapy.   Patient currently requires min-mod A with mobility secondary to muscle weakness, decreased cardiorespiratoy endurance, ataxia and decreased coordination, decreased attention, decreased awareness, decreased problem solving, decreased safety awareness, decreased memory and delayed processing and decreased standing balance, decreased postural control and decreased balance strategies.  Prior to hospitalization, patient was independent  with mobility and lived with Spouse in a House home.  Home access is 1Stairs to enter.  Patient will benefit from skilled PT intervention to maximize safe functional mobility, minimize fall risk and decrease caregiver burden for planned discharge home with 24 hour supervision.  Anticipate patient will benefit from follow up OP at discharge.  PT - End of Session Activity Tolerance: Decreased this session;Tolerates 30+ min activity with multiple rests Endurance Deficit: Yes Endurance Deficit  Description:  requires seated rest breaks with functional mobility PT Assessment Rehab Potential (ACUTE/IP ONLY): Good Barriers to Discharge: Decreased caregiver support PT Patient demonstrates impairments in the following area(s): Balance;Behavior;Endurance;Motor;Safety;Sensory PT Transfers Functional Problem(s): Bed Mobility;Bed to Chair;Car;Furniture;Floor PT Locomotion Functional Problem(s): Ambulation;Wheelchair Mobility;Stairs PT Plan PT Intensity: Minimum of 1-2 x/day ,45 to 90 minutes PT Frequency: 5 out of 7 days PT Duration Estimated Length of Stay: 7-10 days PT Treatment/Interventions: Ambulation/gait training;Balance/vestibular training;Cognitive remediation/compensation;Community reintegration;Discharge planning;Disease management/prevention;DME/adaptive equipment instruction;Functional mobility training;Neuromuscular re-education;Pain management;Patient/family education;Psychosocial support;Stair training;Therapeutic Activities;Therapeutic Exercise;UE/LE Strength taining/ROM;UE/LE Coordination activities PT Transfers Anticipated Outcome(s): supervision PT Locomotion Anticipated Outcome(s): supervision  PT Recommendation Follow Up Recommendations: Outpatient PT;24 hour supervision/assistance Patient destination: Home Equipment Recommended: To be determined Equipment Details: owns several canes  Skilled Therapeutic Intervention Skilled therapeutic intervention initiated after completion of evaluation. Discussed with patient, wife, and son-in-law falls risk, safety within room, and focus of therapy during stay. Discussed possible length of stay, goals, and follow-up therapy. Patient currently requires min-mod A overall with HHA due to decreased safety awareness and impaired balance. Patient unable to maintain static standing without UE support on firm surface or on wedge > 5 sec due to posterior LOB with no balance strategies noted. Patient left in arm chair with quick release belt in place and  family present.   PT Evaluation Precautions/Restrictions Precautions Precautions: Fall Restrictions Weight Bearing Restrictions: No General Chart Reviewed: Yes Family/Caregiver Present: Yes  Pain Pain Assessment Pain Assessment: No/denies pain Home Living/Prior Functioning Home Living Living Arrangements: Spouse/significant other Available Help at Discharge: Family;Available 24 hours/day Type of Home: House Home Access: Stairs to enter [employer] of Steps: 1 Entrance Stairs-Rails: None Home Layout: Multi-level;Able to live on main level with bedroom/bathroom Bathroom Shower/Tub: Tub/shower unit;Curtain Bathroom Toilet: Standard Bathroom Accessibility: Yes Additional Comments: Bedroom/bathroom pt currently uses is upstair, but there is a bedroom/bathroom pt can use on main level  Lives With: Spouse Prior Function Level of Independence: Independent with basic ADLs;Independent with transfers;Independent with gait  Able to Take Stairs?: Yes Driving: Yes Vocation: Retired Leisure: Hobbies-yes (Comment) Comments: Driving, going to gym, mowing yard Vision/Perception    No change from baseline Cognition Overall Cognitive Status: Impaired/Different from baseline Arousal/Alertness: Awake/alert Orientation Level: Oriented to person;Disoriented to time;Disoriented to place;Disoriented to situation (generally oriented to time, but not specifics) Attention: Selective Selective Attention: Impaired Selective Attention Impairment: Functional complex Memory: Impaired Memory Impairment: Retrieval deficit;Decreased recall of new information Decreased Short Term Memory: Verbal basic;Functional basic Awareness: Impaired Awareness Impairment: Intellectual impairment Problem Solving: Impaired Problem Solving Impairment: Verbal basic Executive Function:  (all impaired) Behaviors: Impulsive Safety/Judgment: Impaired Comments: Pt is impulsive and unaware of deficits. He asked  if he could mow the grass.  Sensation Sensation Light Touch: Impaired Detail Light Touch Impaired Details: Impaired LLE;Impaired RLE Stereognosis: Appears Intact Hot/Cold: Appears Intact Proprioception: Appears Intact Additional Comments: pt reports numbness in BLE Coordination Gross Motor Movements are Fluid and Coordinated: No Fine Motor Movements are Fluid and Coordinated: Yes Coordination and Movement Description: L lean and walks with shuffling steps Motor  Motor Motor: Ataxia;Abnormal postural alignment and control Motor - Skilled Clinical Observations: Pt with ataxic gate due to L lean and decreased strength   Mobility Bed Mobility Bed Mobility: Sit to Supine;Supine to Sit Supine to Sit: 5: Supervision Sit to Supine: 5: Supervision Transfers Transfers: Yes Sit to Stand: 4: Min assist Stand to Sit: 4: Min assist Locomotion  Ambulation Ambulation: Yes Ambulation/Gait Assistance: 4: Min assist;3: Mod assist Ambulation Distance (Feet): 200  Feet Assistive device: 1 person hand held assist Gait Gait: Yes Gait Pattern: Impaired Gait Pattern: Step-through pattern;Ataxic;Decreased trunk rotation;Trunk flexed Gait velocity: WFL, unsafe due to decreased control Stairs / Additional Locomotion Stairs: Yes Stairs Assistance: 4: Min guard Stair Management Technique: Two rails;Alternating pattern;Forwards Number of Stairs: 12 Height of Stairs: 3 (8 3", 4 6") Ramp: 4: Min assist Curb: 4: Min Administrator Mobility: No  Trunk/Postural Assessment  Cervical Assessment Cervical Assessment: Within Functional Limits Thoracic Assessment Thoracic Assessment: Within Functional Limits Lumbar Assessment Lumbar Assessment: Within Functional Limits Postural Control Postural Control: Deficits on evaluation Protective Responses: impaired  Balance Balance Balance Assessed: Yes Standardized Balance Assessment Standardized Balance Assessment: Berg Balance  Test Berg Balance Test Sit to Stand: Able to stand  independently using hands Standing Unsupported: Unable to stand 30 seconds unassisted Sitting with Back Unsupported but Feet Supported on Floor or Stool: Able to sit safely and securely 2 minutes Stand to Sit: Controls descent by using hands Transfers: Needs one person to assist Standing Unsupported with Eyes Closed: Needs help to keep from falling Standing Ubsupported with Feet Together: Needs help to attain position and unable to hold for 15 seconds From Standing, Reach Forward with Outstretched Arm: Loses balance while trying/requires external support From Standing Position, Pick up Object from Floor: Unable to try/needs assist to keep balance From Standing Position, Turn to Look Behind Over each Shoulder: Needs assist to keep from losing balance and falling Turn 360 Degrees: Needs assistance while turning Standing Unsupported, Alternately Place Feet on Step/Stool: Needs assistance to keep from falling or unable to try Standing Unsupported, One Foot in Front: Loses balance while stepping or standing Standing on One Leg: Unable to try or needs assist to prevent fall Total Score: 11 Static Sitting Balance Static Sitting - Balance Support: Bilateral upper extremity supported;Feet supported Static Sitting - Level of Assistance: 4: Min assist Dynamic Sitting Balance Dynamic Sitting - Level of Assistance: 4: Min assist Dynamic Sitting - Balance Activities: Lateral lean/weight shifting;Reaching for objects Static Standing Balance Static Standing - Balance Support: During functional activity;No upper extremity supported Static Standing - Level of Assistance: 4: Min assist Dynamic Standing Balance Dynamic Standing - Balance Support: During functional activity;Left upper extremity supported Dynamic Standing - Level of Assistance: 3: Mod assist Extremity Assessment  RUE Assessment RUE Assessment: Within Functional Limits LUE Assessment LUE  Assessment: Within Functional Limits RLE Assessment RLE Assessment: Within Functional Limits LLE Assessment LLE Assessment: Within Functional Limits   See Function Navigator for Current Functional Status.   Refer to Care Plan for Long Term Goals  Recommendations for other services: None  Discharge Criteria: Patient will be discharged from PT if patient refuses treatment 3 consecutive times without medical reason, if treatment goals not met, if there is a change in medical status, if patient makes no progress towards goals or if patient is discharged from hospital.  The above assessment, treatment plan, treatment alternatives and goals were discussed and mutually agreed upon: by patient and by family  Laretta Alstrom 04/26/2016, 5:24 PM

## 2016-04-26 NOTE — Progress Notes (Signed)
Gunnar Fusi Rehab Admission Coordinator Signed Physical Medicine and Rehabilitation  PMR Pre-admission Date of Service: 04/25/2016 1:15 PM  Related encounter: ED to Hosp-Admission (Discharged) from 04/20/2016 in [place] [facility]       [] Hide copied text PMR Admission Coordinator Pre-Admission Assessment  Patient: Larry Ortega is an 82 y.o., male MRN: [id number] DOB: Mar 05, 1924 Height: 6' (182.9 cm) Weight: 66.3 kg (146 lb 2.6 oz)                                                                                                                                                                                                                                                                          Insurance Information HMO:     PPO:      PCP:      IPA:      80/20:      OTHER:  PRIMARY: Medicare A & B      Policy#: [id number]      Subscriber: Self CM Name:       Phone#:      Fax#:  Pre-Cert#:       Employer: Retired Benefits:  Phone #:      Name:  Eff. Date: 10/24/1990     Deduct: $1,316      Out of Pocket Max: unlimited      Life Max: none CIR: 100%      SNF: 100% days 1-20; 80% days 21-100 Outpatient: 80%     Co-Pay: 20% Home Health: 100%      Co-Pay: none DME: 80%     Co-Pay: 20% Providers: patient's choice  [facility] [employer] [place]      Policy#: [id number]      Subscriber: Self CM Name:       Phone#:      Fax#:  Pre-Cert#:       Employer: Retired Benefits:  Phone #: [phone]     Name:  Eff. Date:      Deduct:       Out of Pocket Max:       Life Max:  CIR:       SNF:  Outpatient:      Co-Pay:  Home Health:  Co-Pay:  DME:      Co-Pay:   Medicaid Application Date:       Case Manager:  Disability Application Date:       Case Worker:   Emergency Contact Information        Contact Information    Name Relation Home Work Mobile   [place]  [id number]       Current Medical History  Patient Admitting Diagnosis:  Encephalopathy/seizures/gait disorder  History of Present Illness: Larry Ortega a 82 y.o.malewith history of CKD, lumbar stenosis with spondylolisthesis and LLE radiculopathy, B-CAS who was admitted on 04/20/16 with confusion, dizziness and facial droop. CT head negative for acute changes. Neurology consulted and felt that patient with symptoms likely due to [name] but family deferred t-PA or CTA as patient without motor deficits. Patient developed seizure activity X 2 while in ED and was loaded with Keppra and intubated for airway protection. LP done and patient started on antibiotics and antiviral due ot concerns of meningitis. 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 Carotid dopplers showed severe calcific plaque origin of R-ICA/ECA 1-39% and moderate soft plaque throughout CCA with moderate mixed plaque at origin 1-39%. EEG with nonspecific diffuse cerebral dysfunction. He tolerated extubation on 08/01 and started on regular diet. Placedon IV acyclovir due to IGG+ but hsv DNA negative---d/ced. PT evaluation done revealing confusion, significant balance deficits and decreased awareness of deficits. CIR recommended for follow up therapy and patient admitted 04/25/16.  NIH Total: 17    Past Medical History      Past Medical History:  Diagnosis Date  . Bilateral carotid artery occlusion   . Cancer [facility])    prostate  . Chronic kidney disease   . Hypertensive heart disease without congestive heart failure   . Lumbar spinal stenosis   . Lumbar spondylosis     Family History  family history is not on file.  Prior Rehab/Hospitalizations:  Has the patient had major surgery during 100 days prior to admission? No  Current Medications   Current Facility-Administered Medications:  .  0.9 %  sodium chloride infusion, 250 mL, Intravenous, PRN, Sela Hua, MD, Stopped at 04/24/16 1900 .  aspirin  chewable tablet 81 mg, 81 mg, Per Tube, Daily, Sela Hua, MD, 81 mg at 04/25/16 1005 .  clopidogrel (PLAVIX) tablet 75 mg, 75 mg, Per Tube, Daily, Sela Hua, MD, 75 mg at 04/25/16 1005 .  enoxaparin (LOVENOX) injection 40 mg, 40 mg, Subcutaneous, Q24H, Sela Hua, MD, 40 mg at 04/25/16 1005 .  feeding supplement (ENSURE ENLIVE) (ENSURE ENLIVE) liquid 237 mL, 237 mL, Oral, BID BM, Raylene Miyamoto, MD, 237 mL at 04/25/16 1006 .  fentaNYL (SUBLIMAZE) injection 25-100 mcg, 25-100 mcg, Intravenous, Q1H PRN, Javier Glazier, MD, 100 mcg at 04/22/16 1053 .  fentaNYL (SUBLIMAZE) injection 50 mcg, 50 mcg, Intravenous, Q15 min PRN, Sela Hua, MD, 50 mcg at 04/22/16 0353 .  insulin aspart (novoLOG) injection 0-9 Units, 0-9 Units, Subcutaneous, Q4H, Raylene Miyamoto, MD, 1 Units at 04/25/16 1247 .  levETIRAcetam (KEPPRA) 500 mg in sodium chloride 0.9 % 100 mL IVPB, 500 mg, Intravenous, Q12H, Raylene Miyamoto, MD, 500 mg at 04/25/16 1011 .  LORazepam (ATIVAN) injection 1-2 mg, 1-2 mg, Intravenous, Q30 min PRN, Javier Glazier, MD  Patients Current Diet: Diet regular Room service appropriate? Yes; Fluid consistency: Thin  Precautions / Restrictions Precautions Precautions: Fall Precaution Comments: no awareness  of balance deficits. Restrictions Weight Bearing Restrictions: No   Has the patient had 2 or more falls or a fall with injury in the past year?No, 1 fall without injury while out in the yard  Prior Activity Level Community (5-7x/wk): Prior to admission patient was out daily in the community driving, working out, and running errands with his wife.  Patient with some mild dementia per son-in-law and he and his wife are willing to assist patient and wife as needed upon discharge.    Home Assistive Devices / Equipment Home Assistive Devices/Equipment: None Home Equipment: Walker - 2 wheels, Cane - single point  Prior Device Use: Indicate devices/aids used by the  patient prior to current illness, exacerbation or injury? Patient reports using a walking stick in the yard, on uneven surfaces prior to admission  Prior Functional Level Prior Function Level of Independence: Independent Comments: drives, mows the grass, goes to the gym x3 days per week.    Self Care: Did the patient need help bathing, dressing, using the toilet or eating?  Independent  Indoor Mobility: Did the patient need assistance with walking from room to room (with or without device)? Independent  Stairs: Did the patient need assistance with internal or external stairs (with or without device)? Independent  Functional Cognition: Did the patient need help planning regular tasks such as shopping or remembering to take medications? Independent  Current Functional Level Cognition Overall Cognitive Status: Impaired/Different from baseline Current Attention Level: Sustained Orientation Level: Oriented to person, Oriented to place, Oriented to time, Disoriented to situation Following Commands: Follows one step commands consistently Safety/Judgement: Decreased awareness of safety, Decreased awareness of deficits General Comments: Pt able to recall he had fall and hit his head. Identifies that he is fearful of falling now. Pt continues to be unaware of balance deficits and lean in standing.    Extremity Assessment (includes Sensation/Coordination) Upper Extremity Assessment: LUE deficits/detail LUE Deficits / Details: AROM overall WFL. Strength generally 4/5. Pt requiring more cues to testing of LUE; decreased awareness to L side. Reports no sensation changes.  Lower Extremity Assessment: Defer to PT evaluation LLE Deficits / Details: decreased left lower leg sensation in sock like pattern from proximal shin down.  The first thing he acknowleges that I am touching is his left knee.  Strength is mildly impaired on this side 4/5 vs 5/5 on right.    ADLs Overall ADL's : Needs  assistance/impaired Grooming: Moderate assistance, Standing, Brushing hair, Wash/dry face Grooming Details (indicate cue type and reason): Mod support for balance in standing.   Mobility General bed mobility comments: Pt was OOB in the chair.     Transfers Overall transfer level: Needs assistance Equipment used: None Transfers: Sit to/from Stand Sit to Stand: Min assist, Mod assist General transfer comment: Min, up to mod assist to adjust for balance during transition to stand, verbal cues for safe hand placement and assist to stabilize the recliner chair as he was leaning his lower legs heavily on chair for support to get to standing.  Once standing increased assist needed to stabilize for balance.    Ambulation / Gait / Stairs / Wheelchair Mobility Ambulation/Gait General Gait Details: NT as pt is not safe to walk without second person assisting.    Posture / Balance Balance Overall balance assessment: Needs assistance Sitting-balance support: Feet supported, No upper extremity supported Sitting balance-Leahy Scale: Good Standing balance support: Bilateral upper extremity supported Standing balance-Leahy Scale: Poor Standing balance comment: Up to mod assist in  standing to keep midline balance Pt leaning anteriorly when he first stood and then to the left once anterior weight shift was corrected.  He has no awareness of his balance deficit and asked me if he could go home today.     Special needs/care consideration BiPAP/CPAP: No CPM: No Continuous Drip IV: No Dialysis: No         Life Vest: No Oxygen: No Special Bed: No Trach Size: No Wound Vac (area): No       Skin: WDL                               Bowel mgmt: 04/23/16 Bladder mgmt: continent  Diabetic mgmt: No    Previous Home Environment Living Arrangements: Spouse/significant other Available Help at Discharge: Family, Available 24 hours/day Type of Home: [place]: No  Discharge Living Setting Plans for  Discharge Living Setting: Patient's home Type of Home at Discharge: House Discharge Home Layout: Multi-level, Able to live on main level with bedroom/bathroom (bed and bath on second floor prior to admission ) Alternate Level Stairs-Rails: Left Alternate Level Stairs-Number of Steps: 12 Discharge Home Access: Stairs to enter Entrance Stairs-Rails: None Entrance Stairs-Number of Steps: 1 Discharge Bathroom Shower/Tub: Tub/shower unit Discharge Bathroom Toilet: Standard Discharge Bathroom Accessibility: Yes How Accessible: Accessible via walker Does the patient have any problems obtaining your medications?: No  Social/Family/Support Systems Patient Roles: Spouse, Parent Contact Information: SpouseOrval Gatley, cell [phone] Anticipated Caregiver: Daughter and son-in-law Anticipated Caregiver's Contact Information: [name] and Armen Pickup cell [phone] Ability/Limitations of Caregiver: Spouse can provide Supervision assist only Caregiver Availability: 24/7 Discharge Plan Discussed with Primary Caregiver: Yes Is Caregiver In Agreement with Plan?: Yes Does Caregiver/Family have Issues with Lodging/Transportation while Pt is in Rehab?: No  Goals/Additional Needs Patient/Family Goal for Rehab: PT/OT/SLP Mod I; however, children able to provide assist as needed Expected length of stay: 7-10 days Cultural Considerations: [employer] active in his church  Dietary Needs: Regular textures and thin liquids Equipment Needs: TBD Special Service Needs: None Additional Information: None Pt/Family Agrees to Admission and willing to participate: Yes Program Orientation Provided & Reviewed with Pt/Caregiver Including Roles  & Responsibilities: Yes Additional Information Needs: None Information Needs to be Provided By: N/A   Decrease burden of Care through IP rehab admission: No   Possible need for SNF placement upon discharge: No   Patient Condition: This patient's  medical and functional status has changed since the consult dated 04/25/16 in which the Rehabilitation Physician determined and documented that the patient was potentially appropriate for intensive rehabilitative care in an inpatient rehabilitation facility. Issues have been addressed and update has been discussed with Dr. Naaman Plummer and patient agreeable with plan for inpatient rehabilitation. Will admit to inpatient rehab today.    Preadmission Screen Completed By:  Gunnar Fusi, 04/25/2016 1:15 PM ______________________________________________________________________   Discussed status with Dr. Naaman Plummer on 04/25/16 at 1325 and received telephone approval for admission today.  Admission Coordinator:  Gunnar Fusi, time 1325/Date 04/25/16       Cosigned by: Meredith Staggers, MD at 04/25/2016 1:47 PM  Revision History

## 2016-04-27 ENCOUNTER — Inpatient Hospital Stay (HOSPITAL_COMMUNITY): Payer: Medicare Other | Admitting: Occupational Therapy

## 2016-04-27 ENCOUNTER — Inpatient Hospital Stay (HOSPITAL_COMMUNITY): Payer: Medicare Other | Admitting: Speech Pathology

## 2016-04-27 ENCOUNTER — Inpatient Hospital Stay (HOSPITAL_COMMUNITY): Payer: Medicare Other | Admitting: Physical Therapy

## 2016-04-27 NOTE — Progress Notes (Signed)
[facility] PHYSICAL MEDICINE & REHABILITATION     PROGRESS NOTE    Subjective/Complaints: Pt laying in bed this AM.  He states he had a good first day of therapies.    ROS: Denies CP, SOB, N/V/D.   Objective: Vital Signs: Blood pressure (!) 135/56, pulse (!) 57, temperature 97.7 F (36.5 C), temperature source Oral, resp. rate 20, height 5\' 11"  (1.803 m), weight 67.7 kg (149 lb 4 oz), SpO2 97 %. No results found.  Recent Labs  04/26/16 0553  WBC 8.6  HGB 12.8*  HCT 38.2*  PLT 239    Recent Labs  04/26/16 0553  NA 141  K 3.3*  CL 107  GLUCOSE 153*  BUN 24*  CREATININE 1.23  CALCIUM 9.0   CBG (last 3)   Recent Labs  04/25/16 0825 04/25/16 1139 04/25/16 1553  GLUCAP 133* 129* 156*    Wt Readings from Last 3 Encounters:  04/26/16 67.7 kg (149 lb 4 oz)  04/25/16 66.3 kg (146 lb 2.6 oz)  04/02/16 72.6 kg (160 lb)    Physical Exam:  Constitutional: Fatigued appearing. NAD. HENT: Normocephalic and atraumatic.  Eyes: EOM are normal. Conjunctiva injected.  Cardiovascular: Normal rate and regular rhythm.  No murmur heard. Respiratory: Effort normal and breath sounds normal. No stridor. No respiratory distress. He has no wheezes.  GI: Soft. Bowel sounds are normal. He exhibits no distension. There is no tenderness.  Musculoskeletal: He exhibits no edema or tenderness.  Neurological: He is alert and oriented x3.  Motor: 4+/5 throughout.   Skin: Skin is warm and dry. No erythema.  Psychiatric: His mood and affect appear normal.    Assessment/Plan: 1. Cognitive, mobility, and functional deficits secondary to encephalopathy which require 3+ hours per day of interdisciplinary therapy in a comprehensive inpatient rehab setting. Physiatrist is providing close team supervision and 24 hour management of active medical problems listed below. Physiatrist and rehab team continue to assess barriers to discharge/monitor patient progress toward functional and medical  goals.  Function:  Bathing Bathing position   Position: Shower  Bathing parts Body parts bathed by patient: Right arm, Back, Left arm, Chest, Abdomen, Front perineal area, Buttocks, Right upper leg, Left upper leg, Right lower leg, Left lower leg    Bathing assist Assist Level: Touching or steadying assistance(Pt > 75%)      Upper Body Dressing/Undressing Upper body dressing   What is the patient wearing?: Pull over shirt/dress     Pull over shirt/dress - Perfomed by patient: Thread/unthread right sleeve, Thread/unthread left sleeve, Put head through opening, Pull shirt over trunk          Upper body assist Assist Level: Touching or steadying assistance(Pt > 75%)      Lower Body Dressing/Undressing Lower body dressing   What is the patient wearing?: Pants, Underwear, Socks, Shoes Underwear - Performed by patient: Thread/unthread right underwear leg, Thread/unthread left underwear leg, Pull underwear up/down   Pants- Performed by patient: Thread/unthread right pants leg, Thread/unthread left pants leg, Pull pants up/down, Fasten/unfasten pants       Socks - Performed by patient: Don/doff right sock, Don/doff left sock   Shoes - Performed by patient: Don/doff right shoe, Don/doff left shoe, Fasten right, Fasten left            Lower body assist Assist for lower body dressing: Touching or steadying assistance (Pt > 75%)      Toileting Toileting   Toileting steps completed by patient: Adjust clothing prior to toileting,  Performs perineal hygiene, Adjust clothing after toileting Toileting steps completed by helper: Adjust clothing prior to toileting, Adjust clothing after toileting Toileting Assistive Devices: Grab bar or rail  Toileting assist Assist level: Touching or steadying assistance (Pt.75%)   Transfers Chair/bed transfer Chair/bed transfer activity did not occur: N/A Chair/bed transfer method: Ambulatory Chair/bed transfer assist level: Touching or steadying  assistance (Pt > 75%)       Locomotion Ambulation     Max distance: 150 Assist level: Moderate assist (Pt 50 - 74%)   Wheelchair          Cognition Comprehension Comprehension assist level: Understands basic 75 - 89% of the time/ requires cueing 10 - 24% of the time  Expression Expression assist level: Expresses basic 75 - 89% of the time/requires cueing 10 - 24% of the time. Needs helper to occlude trach/needs to repeat words.  Social Interaction Social Interaction assist level: Interacts appropriately 75 - 89% of the time - Needs redirection for appropriate language or to initiate interaction.  Problem Solving Problem solving assist level: Solves basic 50 - 74% of the time/requires cueing 25 - 49% of the time  Memory Memory assist level: Recognizes or recalls 50 - 74% of the time/requires cueing 25 - 49% of the time   Medical Problem List and Plan: 1.  Cognitive, mobility and functional deficits secondary to encephalopathy and seizures  -Cont CIR therapies 2.  DVT Prophylaxis/Anticoagulation:   Lovenox 3. Pain Management: Tylenol prn. Denies pain at this point 4. Mood: LCSW to follow for evaluation and support.  5. Neuropsych: This patient is not capable of making decisions on his own behalf.  -normalize sleep wake cycle 6. Skin/Wound Care: Routine pressure relief measures. Maintain adequate nutrition and hydration status.  7. Fluids/Electrolytes/Nutrition:   -mild hypokalemia---repleted  -pre-renal azotemia---cont to encourage fluids    8. New onset seizures: On keppra bid.  9. HTN: Monitor BID. Continue metoprolol  Fairly well controlled 10 GERD: Will resume PPI. 11. Altered mental status/delirium:   Seroquel prn for agitation/sundowning.   -monitor sleep-wake pattern  12. Hypertensive heart disease: On ASA/Plavix/Zocor   LOS (Days) 2 A FACE TO FACE EVALUATION WAS PERFORMED  Jehan Ranganathan Lorie Phenix 04/27/2016 5:46 PM

## 2016-04-27 NOTE — Progress Notes (Signed)
Speech Language Pathology Daily Session Note  Patient Details  Name: Larry Ortega MRN: [id number] Date of Birth: [birth year]  Today's Date: 04/27/2016 SLP Individual Time: 0900-1000 SLP Individual Time Calculation (min): 60 min   Short Term Goals: Week 1: SLP Short Term Goal 1 (Week 1): Pt demonstrate O x 4 with mod A.  SLP Short Term Goal 2 (Week 1): Pt tolerate regular diet with use of compensatory strategies to faciliatate oral clearance with min verbal cues.  SLP Short Term Goal 3 (Week 1): Pt to recall new basic information in 5 minute increments with mod A. SLP Short Term Goal 4 (Week 1): Pt to verbalize 2 limitations min A. SLP Short Term Goal 5 (Week 1): Pt complete basic problem solving within functional tasks with mod A.  Skilled Therapeutic Interventions:   Skilled treatment session focused on addressing cognition goals. SLP facilitated session by providing Mod question cues for orientation to situation; patient Mod I for date and time today!  SLP also facilitated session with Max faded to Mobile assist verbal and visual cues to recognize and correct errors with basic-mod money management task.  Following task patient was able to identify that his thinking was as off as his balance, but continued to require cues in the moment for safety.  Continue with current plan of care.   Function:  Cognition Comprehension Comprehension assist level: Understands basic 75 - 89% of the time/ requires cueing 10 - 24% of the time  Expression   Expression assist level: Expresses basic 75 - 89% of the time/requires cueing 10 - 24% of the time. Needs helper to occlude trach/needs to repeat words.  Social Interaction Social Interaction assist level: Interacts appropriately 75 - 89% of the time - Needs redirection for appropriate language or to initiate interaction.  Problem Solving Problem solving assist level: Solves basic 50 - 74% of the time/requires cueing 25 - 49% of the time  Memory Memory assist  level: Recognizes or recalls 50 - 74% of the time/requires cueing 25 - 49% of the time    Pain Pain Assessment Pain Assessment: No/denies pain Pain Score: 2  Pain Type: Acute pain Pain Location: Head Pain Frequency: Rarely Pain Onset: On-going Patients Stated Pain Goal: 0 Pain Intervention(s): Medication (See eMAR)  Therapy/Group: Individual Therapy  Carmelia Roller., [place] [id number]  [place] 04/27/2016, 12:09 PM

## 2016-04-27 NOTE — Progress Notes (Signed)
Physical Therapy Session Note  Patient Details  Name: Larry Ortega MRN: [id number] Date of Birth: 24-Apr-1924  Today's Date: 04/27/2016 PT Individual Time: [id number] PT Individual Time Calculation (min): 27 min    Short Term Goals: Week 1:  PT Short Term Goal 1 (Week 1): = LTGs of overall supervision  Skilled Therapeutic Interventions/Progress Updates:    Patient received sitting in recliner and agreeable to PT. But requesting to use restroom for bowel movement. Gait in room with min A and min cues for obstacle navigation. Patient performed toilet transfer with min A and able to manage clothing with min A. Following bowel movement, PT required to assist patient to pull pants up to waist.   PT instructed patient in gait training in hall and over carpted surface for 127ft, 127ft and 41ft. With min-mod A from PT with mod cues for decreased gait speed and improved step length. Throughout gait training, patient noted to become frustratted that he is so unsteady and didn't know why. PT reoriented patient to Hx and educated him on the purpose and goals of therapy.   PT instructed patient in Toe taps on stairs with BUE support 3 bouts of 1 minute with various sequencing. PT was required to provided constant cues for sustained attention proper sequencing of movement and purpose of task. Patient noted to be able to verbalize sequence request by PT throughout exrecise, but unable to maintain proper movement for >15 seconds without cues.   Ball toss to trampoline 3x 1 minutes. Min-mod A to prevent anterior LOB. Patient noted to attempt stepping strategy to regain balance, but unable to take large enough step to self correct. PT instructed patient in improved ankle strategy to prevent sustained anterior lean.   Patient returned to room and left sitting in recliner with quick release belt in place and wife present.   Therapy Documentation Precautions:  Precautions Precautions: Fall Restrictions Weight  Bearing Restrictions: No   See Function Navigator for Current Functional Status.   Therapy/Group: Individual Therapy  Lorie Phenix 04/27/2016, 6:36 PM

## 2016-04-27 NOTE — Progress Notes (Signed)
Occupational Therapy Session Note  Patient Details  Name: Larry Ortega MRN: [id number] Date of Birth: Aug 17, 1924  Today's Date: 04/27/2016 OT Individual Time: 1100-1220 OT Individual Time Calculation (min): 80 min     Short Term Goals: Week 1:  OT Short Term Goal 1 (Week 1): Pt will complete grooming activity standing at sink with min A  OT Short Term Goal 2 (Week 1): Pt will complete shower transfer with min A  OT Short Term Goal 3 (Week 1): Pt will pull up pants completing sit to stand with min A   Skilled Therapeutic Interventions/Progress Updates:   Patient's son present for session and wife came in toward the end.   Patient participated as follows:  Functional mobility from recliner to toilet with Hand Held Minimal Assistance and he stated, "I tend to lean backwards."   Standing toilet transfer for unsuccessful voiding=Min A; shower transfer via no threshold, grab bar, and shower chair = Min A;     Lower body bathing=Min A   LB dressing in standing= moderate assistance for dynamic balance but able to don and tie shoes  Patient appeared impulsive and stated, "I worked at the fast pace when things needed to get done."   Often he did not wait for instructions before proceeding to walk or get out the chair in which he was belted for safety.  For self feeding lunch, patient able to set up tray but required constant verbal cues to take "small" bites.   He tended to take large sized bits but premorbid per his son.  Therapy Documentation Precautions:  Precautions Precautions: Fall Restrictions Weight Bearing Restrictions: No Pain: Pain Assessment Pain Assessment: No/denies pain See Function Navigator for Current Functional Status.   Therapy/Group: Individual Therapy  Alfredia Ferguson [facility] 04/27/2016, 3:42 PM

## 2016-04-27 NOTE — Progress Notes (Signed)
Occupational Therapy Session Note  Patient Details  Name: Larry Ortega MRN: [id number] Date of Birth: 06-21-1924  Today's Date: 04/27/2016 OT Individual Time: [id number] OT Individual Time Calculation (min): 47 min     Short Term Goals: Week 1:  OT Short Term Goal 1 (Week 1): Pt will complete grooming activity standing at sink with min A  OT Short Term Goal 2 (Week 1): Pt will complete shower transfer with min A  OT Short Term Goal 3 (Week 1): Pt will pull up pants completing sit to stand with min A   Skilled Therapeutic Interventions/Progress Updates:   Patient waking up from nap on approach for 2nd sessiontoday.   He c/o of stiffness.     He participating in stretching, trunk stability, focusm, and dynamic standing balance and functional mobility for self care tasks.  Patient positioned in recliner with safety belt in place and wife present at the end of the session.  Therapy Documentation Precautions:  Precautions Precautions: Fall Restrictions Weight Bearing Restrictions: No Pain:denied    See Function Navigator for Current Functional Status.   Therapy/Group: Individual Therapy  Alfredia Ferguson [facility] 04/27/2016, 4:51 PM

## 2016-04-28 ENCOUNTER — Inpatient Hospital Stay (HOSPITAL_COMMUNITY): Payer: Medicare Other | Admitting: Physical Therapy

## 2016-04-28 NOTE — Progress Notes (Signed)
[place] PHYSICAL MEDICINE & REHABILITATION     PROGRESS NOTE    Subjective/Complaints: Pt laying in bed this AM.  He is easily arousable.  Pt is jovial . Wife at bedside.    ROS: Denies CP, SOB, N/V/D.  Objective: Vital Signs: Blood pressure (!) 126/53, pulse 61, temperature 98.7 F (37.1 C), temperature source Oral, resp. rate 18, height 5\' 11"  (1.803 m), weight 70.2 kg (154 lb 12.2 oz), SpO2 95 %. No results found.  Recent Labs  04/26/16 0553  WBC 8.6  HGB 12.8*  HCT 38.2*  PLT 239    Recent Labs  04/26/16 0553  NA 141  K 3.3*  CL 107  GLUCOSE 153*  BUN 24*  CREATININE 1.23  CALCIUM 9.0   CBG (last 3)   Recent Labs  04/25/16 0825 04/25/16 1139 04/25/16 1553  GLUCAP 133* 129* 156*    Wt Readings from Last 3 Encounters:  04/28/16 70.2 kg (154 lb 12.2 oz)  04/25/16 66.3 kg (146 lb 2.6 oz)  04/02/16 72.6 kg (160 lb)    Physical Exam:  Constitutional: NAD. Vital signs reviewed.  HENT: Normocephalic and atraumatic.  Eyes: EOM and Conj are normal.  Cardiovascular: Normal rate and regular rhythm.  No murmur heard. Respiratory: Effort normal and breath sounds normal. No stridor. No respiratory distress. He has no wheezes.  GI: Soft. Bowel sounds are normal. He exhibits no distension. There is no tenderness.  Musculoskeletal: He exhibits no edema or tenderness.  Neurological: He is alert and oriented x3.  Motor: 4+/5 throughout.   Skin: Skin is warm and dry. No erythema.  Psychiatric: His mood and affect appear normal.  In good spirits  Assessment/Plan: 1. Cognitive, mobility, and functional deficits secondary to encephalopathy which require 3+ hours per day of interdisciplinary therapy in a comprehensive inpatient rehab setting. Physiatrist is providing close team supervision and 24 hour management of active medical problems listed below. Physiatrist and rehab team continue to assess barriers to discharge/monitor patient progress toward functional and  medical goals.  Function:  Bathing Bathing position   Position: Shower  Bathing parts Body parts bathed by patient: Right arm, Back, Left arm, Chest, Abdomen, Front perineal area, Buttocks, Right upper leg, Left upper leg, Right lower leg, Left lower leg    Bathing assist Assist Level: Touching or steadying assistance(Pt > 75%)      Upper Body Dressing/Undressing Upper body dressing   What is the patient wearing?: Pull over shirt/dress     Pull over shirt/dress - Perfomed by patient: Thread/unthread right sleeve, Thread/unthread left sleeve, Put head through opening, Pull shirt over trunk          Upper body assist Assist Level: Touching or steadying assistance(Pt > 75%)      Lower Body Dressing/Undressing Lower body dressing   What is the patient wearing?: Pants, Underwear, Socks, Shoes Underwear - Performed by patient: Thread/unthread right underwear leg, Thread/unthread left underwear leg, Pull underwear up/down   Pants- Performed by patient: Thread/unthread right pants leg, Thread/unthread left pants leg, Pull pants up/down, Fasten/unfasten pants       Socks - Performed by patient: Don/doff right sock, Don/doff left sock   Shoes - Performed by patient: Don/doff right shoe, Don/doff left shoe, Fasten right, Fasten left            Lower body assist Assist for lower body dressing: Touching or steadying assistance (Pt > 75%)      Toileting Toileting   Toileting steps completed by patient: Adjust  clothing prior to toileting, Performs perineal hygiene, Adjust clothing after toileting Toileting steps completed by helper: Adjust clothing prior to toileting, Adjust clothing after toileting Toileting Assistive Devices: Grab bar or rail  Toileting assist Assist level: Touching or steadying assistance (Pt.75%)   Transfers Chair/bed transfer Chair/bed transfer activity did not occur: N/A Chair/bed transfer method: Stand pivot Chair/bed transfer assist level: Touching or  steadying assistance (Pt > 75%) Chair/bed transfer assistive device: Armrests     Locomotion Ambulation     Max distance: 146ft  Assist level: Moderate assist (Pt 50 - 74%)   Wheelchair          Cognition Comprehension Comprehension assist level: Understands basic 75 - 89% of the time/ requires cueing 10 - 24% of the time  Expression Expression assist level: Expresses basic 75 - 89% of the time/requires cueing 10 - 24% of the time. Needs helper to occlude trach/needs to repeat words.  Social Interaction Social Interaction assist level: Interacts appropriately 75 - 89% of the time - Needs redirection for appropriate language or to initiate interaction.  Problem Solving Problem solving assist level: Solves basic 50 - 74% of the time/requires cueing 25 - 49% of the time  Memory Memory assist level: Recognizes or recalls 50 - 74% of the time/requires cueing 25 - 49% of the time   Medical Problem List and Plan: 1.  Cognitive, mobility and functional deficits secondary to encephalopathy and seizures  -Cont CIR therapies 2.  DVT Prophylaxis/Anticoagulation:   Lovenox 3. Pain Management: Tylenol prn. Denies pain at this point 4. Mood: LCSW to follow for evaluation and support.  5. Neuropsych: This patient is not capable of making decisions on his own behalf.  -normalize sleep wake cycle 6. Skin/Wound Care: Routine pressure relief measures. Maintain adequate nutrition and hydration status.  7. Fluids/Electrolytes/Nutrition:   -mild hypokalemia---repleted  -pre-renal azotemia---cont to encourage fluids    8. New onset seizures: On keppra bid.  9. HTN: Monitor BID. Continue metoprolol  Fairly well controlled 10 GERD: Resumed PPI. 11. Altered mental status/delirium:   Seroquel prn for agitation/sundowning.   -monitor sleep-wake pattern   -Appears to have resolved 12. Hypertensive heart disease: On ASA/Plavix/Zocor   LOS (Days) 3 A FACE TO FACE EVALUATION WAS PERFORMED  [name] Lorie Phenix 04/28/2016 7:30 AM

## 2016-04-28 NOTE — Progress Notes (Signed)
Physical Therapy Session Note  Patient Details  Name: Larry Ortega MRN: [id number] Date of Birth: April 01, 1924  Today's Date: 04/28/2016 PT Individual Time: [id number] PT Individual Time Calculation (min): 46 min    Short Term Goals: Week 1:  PT Short Term Goal 1 (Week 1): = LTGs of overall supervision  Skilled Therapeutic Interventions/Progress Updates:    Pt received in recliner & agreeable to PT, denying c/o pain & A x O x 4. Session focused on gait training & cognition. Pt ambulated room>gym x 125 ft with HHA & min/mod A; pt demonstrates forward trunk flexion, anterior weight shift, significant lateral sway & intermittent scissoring gait. Pt with absent heel strike B LE during gait. Performed pipe tree assembly with pt successfully assembling a 5 piece shape with supervision, but requiring maximum verbal cuing to select appropriate size pieces when assembling 8 piece shape. Pt unable to distinguish correct length of pipe needed. Stair training completed with pt utilizing B rails, self selecting reciprocal pattern, and Min A. Pt with L lateral lean with stair negotiation and L inattention with ambulation. Educated pt on use of RW & pt able to ambulate 150 ft + 50 ft with AD with Min A, cuing for decreased gait speed, and cuing to look up. Pt required minimal verbal cuing to attend to L to prevent him from running into wall. In room, pt reported need to use restroom, pt ambulated recliner<>bathroom with HHA & Mod A; (+) small BM. At end of session pt left sitting in recliner with family present, all needs within reach & QRB donned.  RN cleared pt's wife to provide supervision for pt's dinner.  Therapy Documentation Precautions:  Precautions Precautions: Fall Restrictions Weight Bearing Restrictions: No  Pain: Pain Assessment Pain Assessment: No/denies pain   See Function Navigator for Current Functional Status.   Therapy/Group: Christie 04/28/2016, 5:12 PM

## 2016-04-28 NOTE — Progress Notes (Signed)
Patient has had frequent small/med stools, soft and with mucous. Spouse is concerned about cause. Will pass to oncoming RN.

## 2016-04-29 ENCOUNTER — Inpatient Hospital Stay (HOSPITAL_COMMUNITY): Payer: Medicare Other | Admitting: Speech Pathology

## 2016-04-29 ENCOUNTER — Inpatient Hospital Stay (HOSPITAL_COMMUNITY): Payer: Medicare Other | Admitting: Occupational Therapy

## 2016-04-29 ENCOUNTER — Encounter (HOSPITAL_COMMUNITY): Payer: Medicare Other

## 2016-04-29 ENCOUNTER — Inpatient Hospital Stay (HOSPITAL_COMMUNITY): Payer: Medicare Other | Admitting: Physical Therapy

## 2016-04-29 DIAGNOSIS — R197 Diarrhea, unspecified: Secondary | ICD-10-CM

## 2016-04-29 LAB — BASIC METABOLIC PANEL
Anion gap: 8 (ref 5–15)
BUN: 27 mg/dL — ABNORMAL HIGH (ref 6–20)
CHLORIDE: 106 mmol/L (ref 101–111)
CO2: 23 mmol/L (ref 22–32)
CREATININE: 1.3 mg/dL — AB (ref 0.61–1.24)
Calcium: 9.3 mg/dL (ref 8.9–10.3)
GFR calc non Af Amer: 47 mL/min — ABNORMAL LOW (ref >60)
GFR, EST AFRICAN AMERICAN: 54 mL/min — AB (ref >60)
Glucose, Bld: 129 mg/dL — ABNORMAL HIGH (ref 65–99)
POTASSIUM: 3.6 mmol/L (ref 3.5–5.1)
SODIUM: 137 mmol/L (ref 135–145)

## 2016-04-29 MED FILL — Perflutren Lipid Microsphere IV Susp 1.1 MG/ML: INTRAVENOUS | Qty: 10 | Status: AC

## 2016-04-29 NOTE — Progress Notes (Signed)
Occupational Therapy Session Note  Patient Details  Name: Larry Ortega MRN: [id number] Date of Birth: Jan 22, 1924  Today's Date: 04/29/2016 OT Individual Time: 1100-1200 OT Individual Time Calculation (min): 60 min     Short Term Goals:Week 1:  OT Short Term Goal 1 (Week 1): Pt will complete grooming activity standing at sink with min A  OT Short Term Goal 2 (Week 1): Pt will complete shower transfer with min A  OT Short Term Goal 3 (Week 1): Pt will pull up pants completing sit to stand with min A   Skilled Therapeutic Interventions/Progress Updates:    Pt seen for skilled OT to facilitate dynamic balance, safety awareness, activity balance.  Pt received in bed and agreeable to a shower. Pt sat to EOB and stood with S, as he was ambulating into bathroom pt 2x hit L side of walker against metal frame of BSC. Needed manual A to shift walker to R and cues to attend to R. Pt began to doff socks and pants in standing with L hand on grab bar with strong L and posterior lean. Cued to stop 3x, to sit to doff clothing. In shower, pt also has a L lean in standing, cued to separate feet for wider BOS.   After self care completed, vision assessed. Peripheral fields intact, eye nose test [facility], but impaired tracking in all fields as his eyes can not stay focused on target. Pt was able to read a one page devotional with his glasses out loud without difficulty. Discussed the devotional and how it pertained to his rehab.  Pt worked on dynamic standing balance with forward wt shift from heels to toes, stepping L foot out and in to side with balance supported by therapist and squats with HHA. Pt also worked on [employer]. Pt tolerated exercises well with one short break. Therapist stated that I would get recliner set up and then we would walk to it, pt stood up immediately.  Cued to wait for therapist.  Pt ambulated to recliner, quick release belt in place, call light in reach.  Reviewed reasons for belt and pt  stated he understood.   Therapy Documentation Precautions:  Precautions Precautions: Fall Restrictions Weight Bearing Restrictions: No  Pain: Pain Assessment Pain Assessment: No/denies pain ADL:   See Function Navigator for Current Functional Status.   Therapy/Group: Individual Therapy  [place] 04/29/2016, 12:21 PM

## 2016-04-29 NOTE — Progress Notes (Signed)
Speech Language Pathology Daily Session Notes  Patient Details  Name: Larry Ortega MRN: [id number] Date of Birth: [birth year]  Today's Date: 04/29/2016  Session 1: SLP Individual Time: [id number] SLP Individual Time Calculation (min): 60 min   Session 2: SLP Individual Time: 1430-1500 SLP Individual Time Calculation (min): 30 min   Short Term Goals: Week 1: SLP Short Term Goal 1 (Week 1): Pt demonstrate O x 4 with mod A.  SLP Short Term Goal 2 (Week 1): Pt tolerate regular diet with use of compensatory strategies to faciliatate oral clearance with min verbal cues.  SLP Short Term Goal 3 (Week 1): Pt to recall new basic information in 5 minute increments with mod A. SLP Short Term Goal 4 (Week 1): Pt to verbalize 2 limitations min A. SLP Short Term Goal 5 (Week 1): Pt complete basic problem solving within functional tasks with mod A.  Skilled Therapeutic Interventions:  Session 1: Skilled treatment session focused on dysphagia and cognitive goals. SLP facilitated session by providing supervision verbal cues for patient to utilize small bites/sips and a slow rate of self-feeding. Patient consumed breakfast meal of regular textures with thin liquids without overt s/s of aspiration and patient's wife provided appropriate cueing, therefore, recommend patient's wife is signed off to provide full supervision with meals. Patient was independently oriented to person, place and time with supervision verbal cues. Patient verbalized how to appropriately use the call bell and 1 reason he might need to utilize the call bell with supervision question cues. Patient left supine in bed with alarm on and all needs within reach. Continue with current plan of care.   Session 2: Skilled treatment session focused on cognitive goals. SLP facilitated session by providing Max A question cues for orientation to situation, however, patient was independently oriented to place and time. Patient demonstrated intermittent  perseveration and was consistently asking the clinician what time his wife would be returning from home and required increased cueing (overall Max A) for sustained attention to topic of conversation/task, suspect due to fatigue. Patient required total A to utilize schedule and recall events from earlier therapy sessions. Overall, patient appeared more confused this session, suspect due to fatigue. Patient left upright in recliner with all needs within reach and quick release belt in place. Continue with current plan of care.    Function:  Eating Eating   Modified Consistency Diet: No Eating Assist Level: More than reasonable amount of time;Set up assist for;Supervision or verbal cues   Eating Set Up Assist For: Opening containers;Cutting food       Cognition Comprehension Comprehension assist level: Understands basic 75 - 89% of the time/ requires cueing 10 - 24% of the time  Expression   Expression assist level: Expresses basic 75 - 89% of the time/requires cueing 10 - 24% of the time. Needs helper to occlude trach/needs to repeat words.  Social Interaction Social Interaction assist level: Interacts appropriately 75 - 89% of the time - Needs redirection for appropriate language or to initiate interaction.  Problem Solving Problem solving assist level: Solves basic 50 - 74% of the time/requires cueing 25 - 49% of the time  Memory Memory assist level: Recognizes or recalls 50 - 74% of the time/requires cueing 25 - 49% of the time    Pain No/Denies Pain   Therapy/Group: Individual Therapy  Larry Ortega 04/29/2016, 12:03 PM

## 2016-04-29 NOTE — Progress Notes (Signed)
[place] PHYSICAL MEDICINE & REHABILITATION     PROGRESS NOTE    Subjective/Complaints: Patient complains of going to the bathroom to often, indicates that this is stool. No abdominal pains. No nausea or vomiting.  Reviewed RN documentation- No incont Loose stool x1 and watery stool x 2 on 8/7  ROS: Denies CP, SOB, N/V/D.  Objective: Vital Signs: Blood pressure (!) 150/58, pulse 66, temperature 97.8 F (36.6 C), temperature source Oral, resp. rate 18, height 5\' 11"  (1.803 m), weight 70.4 kg (155 lb 4.8 oz), SpO2 99 %. No results found. No results for input(s): WBC, HGB, HCT, PLT in the last 72 hours.  Recent Labs  04/29/16 0602  NA 137  K 3.6  CL 106  GLUCOSE 129*  BUN 27*  CREATININE 1.30*  CALCIUM 9.3   CBG (last 3)  No results for input(s): GLUCAP in the last 72 hours.  Wt Readings from Last 3 Encounters:  04/29/16 70.4 kg (155 lb 4.8 oz)  04/25/16 66.3 kg (146 lb 2.6 oz)  04/02/16 72.6 kg (160 lb)    Physical Exam:  Constitutional: NAD. Vital signs reviewed.  HENT: Normocephalic and atraumatic.  Eyes: EOM and Conj are normal.  Cardiovascular: Normal rate and regular rhythm.  No murmur heard. Respiratory: Effort normal and breath sounds normal. No stridor. No respiratory distress. He has no wheezes.  GI: Soft. Bowel sounds are normal. He exhibits no distension. There is no tenderness.  Musculoskeletal: He exhibits no edema or tenderness.  Neurological: He is alert and oriented x3.  Motor: 4+/5 throughout.   Skin: Skin is warm and dry. No erythema.  Psychiatric: His mood and affect appear normal.  In good spirits  Assessment/Plan: 1. Cognitive, mobility, and functional deficits secondary to encephalopathy which require 3+ hours per day of interdisciplinary therapy in a comprehensive inpatient rehab setting. Physiatrist is providing close team supervision and 24 hour management of active medical problems listed below. Physiatrist and rehab team continue  to assess barriers to discharge/monitor patient progress toward functional and medical goals.  Function:  Bathing Bathing position   Position: Shower  Bathing parts Body parts bathed by patient: Right arm, Back, Left arm, Chest, Abdomen, Front perineal area, Buttocks, Right upper leg, Left upper leg, Right lower leg, Left lower leg    Bathing assist Assist Level: Touching or steadying assistance(Pt > 75%)      Upper Body Dressing/Undressing Upper body dressing   What is the patient wearing?: Pull over shirt/dress     Pull over shirt/dress - Perfomed by patient: Thread/unthread right sleeve, Thread/unthread left sleeve, Put head through opening, Pull shirt over trunk          Upper body assist Assist Level: Touching or steadying assistance(Pt > 75%)      Lower Body Dressing/Undressing Lower body dressing   What is the patient wearing?: Pants, Underwear, Socks, Shoes Underwear - Performed by patient: Thread/unthread right underwear leg, Thread/unthread left underwear leg, Pull underwear up/down   Pants- Performed by patient: Thread/unthread right pants leg, Thread/unthread left pants leg, Pull pants up/down, Fasten/unfasten pants       Socks - Performed by patient: Don/doff right sock, Don/doff left sock   Shoes - Performed by patient: Don/doff right shoe, Don/doff left shoe, Fasten right, Fasten left            Lower body assist Assist for lower body dressing: Touching or steadying assistance (Pt > 75%)      Toileting Toileting   Toileting steps completed by  patient: Adjust clothing prior to toileting, Performs perineal hygiene Toileting steps completed by helper: Adjust clothing after toileting Toileting Assistive Devices: Grab bar or rail  Toileting assist Assist level: Touching or steadying assistance (Pt.75%)   Transfers Chair/bed transfer Chair/bed transfer activity did not occur: N/A Chair/bed transfer method: Ambulatory Chair/bed transfer assist level:  Touching or steadying assistance (Pt > 75%) Chair/bed transfer assistive device: Armrests     Locomotion Ambulation     Max distance: 150 ft Assist level: Touching or steadying assistance (Pt > 75%)   Wheelchair          Cognition Comprehension Comprehension assist level: Understands basic 75 - 89% of the time/ requires cueing 10 - 24% of the time  Expression Expression assist level: Expresses basic 75 - 89% of the time/requires cueing 10 - 24% of the time. Needs helper to occlude trach/needs to repeat words.  Social Interaction Social Interaction assist level: Interacts appropriately 75 - 89% of the time - Needs redirection for appropriate language or to initiate interaction.  Problem Solving Problem solving assist level: Solves basic 50 - 74% of the time/requires cueing 25 - 49% of the time  Memory Memory assist level: Recognizes or recalls 50 - 74% of the time/requires cueing 25 - 49% of the time   Medical Problem List and Plan: 1.  Cognitive, mobility and functional deficits secondary to encephalopathy and seizures  -Cont CIR therapies, PT, OT, speech 2.  DVT Prophylaxis/Anticoagulation:   Lovenox 3. Pain Management: Tylenol prn. Denies pain at this point 4. Mood: LCSW to follow for evaluation and support.  5. Neuropsych: This patient is not capable of making decisions on his own behalf.  -normalize sleep wake cycle 6. Skin/Wound Care: Routine pressure relief measures. Maintain adequate nutrition and hydration status.  7. Fluids/Electrolytes/Nutrition:   -mild hypokalemia---repleted  -pre-renal azotemia---cont to encourage fluids    8. New onset seizures: On keppra bid. Discussed seizure medication with son 66. HTN: Monitor BID. Continue metoprolol  Fairly well controlled 10 GERD: Resumed PPI. 11. Altered mental status/delirium:   Seroquel prn for agitation/sundowning.   -monitor sleep-wake pattern   -Appears to have resolved 12. Hypertensive heart disease: On  ASA/Plavix/Zocor 13. Diarrhea with loose stool 1. Watery stool 2 now on enteric precautions. Awaiting stool for C. difficile, meds reviewed. No evidence of laxative use  LOS (Days) 4 A FACE TO FACE EVALUATION WAS PERFORMED  Charlett Blake 04/29/2016 7:39 AM

## 2016-04-29 NOTE — Progress Notes (Signed)
Physical Therapy Session Note  Patient Details  Name: Larry Ortega MRN: [id number] Date of Birth: [birth year]  Today's Date: 04/29/2016 PT Individual Time: [id number] PT Individual Time Calculation (min): 48 min    Short Term Goals: Week 1:  PT Short Term Goal 1 (Week 1): = LTGs of overall supervision  Skilled Therapeutic Interventions/Progress Updates:    Pt received in recliner & agreeable to PT, denying c/o pain. Session focused on gait training, standing balance & cognition. Pt ambulated 125 ft with RW & Min A with cuing for obstacle avoidance and forward gaze. Balance challenged by having pt stand on foam to reach for & toss horseshoes with pt requiring consistent Mod A to prevent LOB. Pt stood on level surface while performing cognitive task of matching cards. Pt required steady A fading to supervision for standing balance with occasional min/mod A to prevent posterior loss of balance. Pt required cuing 20% of the time to complete matching task. Pt ambulated back to room in manner noted above & reported need to use bathroom. Pt completed toilet transfers with grab bar & steady/supervision A; (+) void. Provided HHA to ambulate to sink then recliner. At end of session pt left sitting in recliner with QRB in place, all needs within reach & family present.   Pt continues to remain impulsive & will attempt to transfer to standing without PT assistance. Pt also unable to recall reason for admission with PT reorienting pt.  Therapy Documentation Precautions:  Precautions Precautions: Fall Restrictions Weight Bearing Restrictions: No  Pain: Pain Assessment Pain Assessment: No/denies pain   See Function Navigator for Current Functional Status.   Therapy/Group: Individual Therapy  [name] 04/29/2016, 5:13 PM

## 2016-04-30 ENCOUNTER — Inpatient Hospital Stay (HOSPITAL_COMMUNITY): Payer: Medicare Other

## 2016-04-30 ENCOUNTER — Inpatient Hospital Stay (HOSPITAL_COMMUNITY): Payer: Medicare Other | Admitting: *Deleted

## 2016-04-30 ENCOUNTER — Inpatient Hospital Stay (HOSPITAL_COMMUNITY): Payer: Medicare Other | Admitting: Occupational Therapy

## 2016-04-30 ENCOUNTER — Inpatient Hospital Stay (HOSPITAL_COMMUNITY): Payer: Medicare Other | Admitting: Speech Pathology

## 2016-04-30 ENCOUNTER — Inpatient Hospital Stay (HOSPITAL_COMMUNITY): Payer: Medicare Other | Admitting: Physical Therapy

## 2016-04-30 LAB — BASIC METABOLIC PANEL
ANION GAP: 8 (ref 5–15)
BUN: 25 mg/dL — AB (ref 6–20)
CALCIUM: 8.8 mg/dL — AB (ref 8.9–10.3)
CO2: 25 mmol/L (ref 22–32)
Chloride: 106 mmol/L (ref 101–111)
Creatinine, Ser: 1.17 mg/dL (ref 0.61–1.24)
GFR calc Af Amer: >60 mL/min (ref >60)
GFR calc non Af Amer: 53 mL/min — ABNORMAL LOW (ref >60)
GLUCOSE: 119 mg/dL — AB (ref 65–99)
POTASSIUM: 3.4 mmol/L — AB (ref 3.5–5.1)
Sodium: 139 mmol/L (ref 135–145)

## 2016-04-30 MED ORDER — METOPROLOL TARTRATE 12.5 MG HALF TABLET
12.5000 mg | ORAL_TABLET | Freq: Two times a day (BID) | ORAL | Status: DC
  Administered 2016-04-30 – 2016-05-03 (×6): 12.5 mg via ORAL
  Filled 2016-04-30 (×6): qty 1

## 2016-04-30 NOTE — Progress Notes (Addendum)
[facility] PHYSICAL MEDICINE & REHABILITATION     PROGRESS NOTE    Subjective/Complaints: No further loose stool No abdominal pains. No nausea or vomiting.  Reviewed RN documentation- No incont No bowel movements for approximately 16 hours  ROS: Denies CP, SOB, N/V/D.  Objective: Vital Signs: Blood pressure (!) 108/54, pulse (!) 56, temperature 98.4 F (36.9 C), temperature source Oral, resp. rate 18, height 5\' 11"  (1.803 m), weight 69.2 kg (152 lb 8 oz), SpO2 98 %. No results found. No results for input(s): WBC, HGB, HCT, PLT in the last 72 hours.  Recent Labs  04/29/16 0602 04/30/16 0707  NA 137 139  K 3.6 3.4*  CL 106 106  GLUCOSE 129* 119*  BUN 27* 25*  CREATININE 1.30* 1.17  CALCIUM 9.3 8.8*   CBG (last 3)  No results for input(s): GLUCAP in the last 72 hours.  Wt Readings from Last 3 Encounters:  04/30/16 69.2 kg (152 lb 8 oz)  04/25/16 66.3 kg (146 lb 2.6 oz)  04/02/16 72.6 kg (160 lb)    Physical Exam:  Constitutional: NAD. Vital signs reviewed.  HENT: Normocephalic and atraumatic.  Eyes: EOM and Conj are normal.  Cardiovascular: Normal rate and regular rhythm.  No murmur heard. Respiratory: Effort normal and breath sounds normal. No stridor. No respiratory distress. He has no wheezes.  GI: Soft. Bowel sounds are normal. He exhibits no distension. There is no tenderness.  Musculoskeletal: He exhibits no edema or tenderness.  Neurological: He is alert and oriented x3.  Motor: 4+/5 throughout.   Skin: Skin is warm and dry. No erythema.  Psychiatric: His mood and affect appear normal.  In good spirits  Assessment/Plan: 1. Cognitive, mobility, and functional deficits secondary to encephalopathy which require 3+ hours per day of interdisciplinary therapy in a comprehensive inpatient rehab setting. Physiatrist is providing close team supervision and 24 hour management of active medical problems listed below. Physiatrist and rehab team continue to assess  barriers to discharge/monitor patient progress toward functional and medical goals.  Function:  Bathing Bathing position   Position: Shower  Bathing parts Body parts bathed by patient: Right arm, Back, Left arm, Chest, Abdomen, Front perineal area, Buttocks, Right upper leg, Left upper leg, Right lower leg, Left lower leg    Bathing assist Assist Level: Supervision or verbal cues      Upper Body Dressing/Undressing Upper body dressing   What is the patient wearing?: Pull over shirt/dress     Pull over shirt/dress - Perfomed by patient: Thread/unthread right sleeve, Thread/unthread left sleeve, Put head through opening, Pull shirt over trunk          Upper body assist Assist Level: Set up      Lower Body Dressing/Undressing Lower body dressing   What is the patient wearing?: Underwear, Pants, Socks, Shoes Underwear - Performed by patient: Thread/unthread right underwear leg, Thread/unthread left underwear leg, Pull underwear up/down   Pants- Performed by patient: Thread/unthread right pants leg, Thread/unthread left pants leg, Pull pants up/down, Fasten/unfasten pants       Socks - Performed by patient: Don/doff right sock, Don/doff left sock   Shoes - Performed by patient: Don/doff right shoe, Don/doff left shoe, Fasten right, Fasten left            Lower body assist Assist for lower body dressing: Supervision or verbal cues      Toileting Toileting   Toileting steps completed by patient: Adjust clothing prior to toileting, Performs perineal hygiene, Adjust clothing after  toileting Toileting steps completed by helper: Adjust clothing after toileting Toileting Assistive Devices: Grab bar or rail  Toileting assist Assist level: Supervision or verbal cues   Transfers Chair/bed transfer Chair/bed transfer activity did not occur: N/A Chair/bed transfer method: Ambulatory Chair/bed transfer assist level: Touching or steadying assistance (Pt > 75%) Chair/bed transfer  assistive device: Medical sales representative     Max distance: 125 ft Assist level: Moderate assist (Pt 50 - 74%) (min A with RW, min/mod A for HHA)   Wheelchair          Cognition Comprehension Comprehension assist level: Understands basic 75 - 89% of the time/ requires cueing 10 - 24% of the time  Expression Expression assist level: Expresses basic 75 - 89% of the time/requires cueing 10 - 24% of the time. Needs helper to occlude trach/needs to repeat words.  Social Interaction Social Interaction assist level: Interacts appropriately 75 - 89% of the time - Needs redirection for appropriate language or to initiate interaction.  Problem Solving Problem solving assist level: Solves basic 50 - 74% of the time/requires cueing 25 - 49% of the time  Memory Memory assist level: Recognizes or recalls 50 - 74% of the time/requires cueing 25 - 49% of the time   Medical Problem List and Plan: 1.  Cognitive, mobility and functional deficits secondary to encephalopathy and seizures  -Cont CIR therapies, PT, OT, speech, Team conference in a.m. 2.  DVT Prophylaxis/Anticoagulation:   Lovenox 3. Pain Management: Tylenol prn. Denies pain at this point 4. Mood: LCSW to follow for evaluation and support.  5. Neuropsych: This patient is not capable of making decisions on his own behalf.  -normalize sleep wake cycle 6. Skin/Wound Care: Routine pressure relief measures. Maintain adequate nutrition and hydration status.  7. Fluids/Electrolytes/Nutrition:   -mild hypokalemia---repleted  -pre-renal azotemia---cont to encourage fluids    8. New onset seizures: On keppra bid. Discussed seizure medication with son 8. HTN: Monitor BID. Continue metoprolol, reduce dosage given bradycardia and low systolic  Fairly well controlled 10 GERD: Resumed PPI. 11. Altered mental status/delirium:   Seroquel prn for agitation/sundowning.   -monitor sleep-wake pattern   -Appears to have resolved 12. Hypertensive  heart disease: On ASA/Plavix/Zocor 13. Diarrhea , resolved  LOS (Days) 5 A FACE TO FACE EVALUATION WAS PERFORMED  Yasamin Karel E 04/30/2016 8:04 AM

## 2016-04-30 NOTE — Progress Notes (Signed)
Physical Therapy Session Note  Patient Details  Name: Larry Ortega MRN: [id number] Date of Birth: 1923-10-10  Today's Date: 04/30/2016 PT Individual Time: 1025-1058 PT Individual Time Calculation (min): 33 min    Short Term Goals: Week 1:  PT Short Term Goal 1 (Week 1): = LTGs of overall supervision  Skilled Therapeutic Interventions/Progress Updates:    Session focused on addressing functional and dynamic gait (dual task), dynamic standing balance during functional tasks and on compliant surface (while pitching horeshoes) and picking up items off of floor (with min assist), endurance, cognition, safety and transfers (basic and toilet transfers). Pt performed mobility at supervision to min assist overall with 1 episode of LOB requiring mod assist to recover. Pt continues to demonstrate poor safety awareness with RW especially with obstacles and during visual scanning activity with verbal cues.    Therapy Documentation Precautions:  Precautions Precautions: Fall Restrictions Weight Bearing Restrictions: No  Pain: Pain Assessment Pain Assessment: No/denies pain   See Function Navigator for Current Functional Status.   Therapy/Group: Individual Therapy  Canary Brim Ivory Broad, PT, DPT  04/30/2016, 11:09 AM

## 2016-04-30 NOTE — Progress Notes (Signed)
Recreational Therapy Assessment and Plan  Patient Details  Name: Larry Ortega MRN: [id number] Date of Birth: 10/13/1923 Today's Date: 04/30/2016  Rehab Potential: Good ELOS: 7 days   Assessment Clinical Impression:  Problem List: Patient Active Problem List   Diagnosis Date Noted  . Encephalopathy 04/25/2016  . Essential hypertension 04/25/2016  . Seizures ([place]) 04/25/2016  . Aphasia   . Acute respiratory failure ([place])   . Cerebral infarction due to unspecified mechanism 04/20/2016  . Cerebrovascular accident (CVA) due to thrombosis of precerebral artery ([place]) 04/20/2016  . 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 Carotid stenosis 09/26/2015    Past Medical History:      Past Medical History:  Diagnosis Date  . Bilateral carotid artery occlusion   . Cancer [facility])    prostate  . Chronic kidney disease   . Hypertensive heart disease without congestive heart failure   . Lumbar spinal stenosis   . Lumbar spondylosis    Past Surgical History:       Past Surgical History:  Procedure Laterality Date  . BOWEL RESECTION    . CHOLECYSTECTOMY    . eyelid surgery      Assessment & Plan Clinical Impression: Larry Ortega a 82 y.o.malewith history of CKD, lumbar stenosis with spondylolisthesis and LLE radiculopathy, B-CAS who was admitted on 04/20/16 with confusion, dizziness and facial droop. CT head negative for acute changes. Neurology consulted and felt that patient with symptoms likely due to [name] but family deferred t-PA or CTA as patient without motor deficits. Patient developed seizure activity X 2 while in ED and was loaded with Keppra and intubated for airway protection. LP done and patient started on antibiotics and antiviral due ot concerns of meningitis. 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 Carotid dopplers showed severe calcific plaque  origin of R-ICA/ECA 1-39% and moderate soft plaque throughout CCA with moderate mixed plaque at origin 1-39%. EEG with nonspecific diffuse cerebral dysfunction. He tolerated extubation on 08/01 and started on regular diet. LP with elevated protein and he was placedon IV acyclovir due to concerns of herpes encephalitis. IGG+ but hsv DNA negative therefore acyclovir d/ced. Neurology felt that seizures were due to underlying cerebrovascular disease and to continue [place]. He continues to have confusion at nights and sustained a fall early his am--repeat CT head done showing stable right frontal encephalomalacia and old left BC infarct--no acute changes. PT evaluation done revealing confusion, significant balance deficits and decreased awareness of deficits. CIR recommended for follow up therapy.   Pt presents with decreased activity tolerance, decreased functional mobility, decreased balance, decreased coordination, decreased attention, decreased awareness, decreased problem solving, decreased safety awareness, decreased memory and delayed processing Limiting pt's independence with leisure/community pursuits.  Leisure History/Participation Premorbid leisure interest/current participation: Larry Ortega store;Community - Travel (Comment);Community Administrator, sports (Comment);Sports - Exercise (Comment);Sports - Golf;Games - Jig-saw puzzles;Games - Cards Expression Interests: Music (Comment) Other Leisure Interests: Television;Reading Leisure Participation Style: With Family/Friends Awareness of Community Resources: Excellent Psychosocial / Spiritual Spiritual Interests: Church;Womens'Men's Groups Social interaction - Mood/Behavior: Cooperative Engineer, drilling for Education?: Yes Patient Agreeable to Outing?: Yes Recreational Therapy Orientation Orientation -Reviewed with patient: Available activity  resources Strengths/Weaknesses Patient Strengths/Abilities: Willingness to participate;Active premorbidly Patient weaknesses: Physical limitations TR Patient demonstrates impairments in the following area(s): Endurance;Motor;Pain;Perception;Safety  Plan Rec Therapy Plan Is patient appropriate for Therapeutic Recreation?: Yes Rehab Potential: Good Treatment times  per week: Min 1 time per week >20 minutes Estimated Length of Stay: 7 days TR Treatment/Interventions: Adaptive equipment instruction;1:1 session;Balance/vestibular training;Functional mobility training;Community reintegration;Cognitive remediation/compensation;Patient/family education;Therapeutic activities;Recreation/leisure participation;Therapeutic exercise;UE/LE Coordination activities  Recommendations for other services: None  Discharge Criteria: Patient will be discharged from TR if patient refuses treatment 3 consecutive times without medical reason.  If treatment goals not met, if there is a change in medical status, if patient makes no progress towards goals or if patient is discharged from hospital.  The above assessment, treatment plan, treatment alternatives and goals were discussed and mutually agreed upon: by patient  Larry Ortega 04/30/2016, 3:57 PM

## 2016-04-30 NOTE — Progress Notes (Addendum)
Occupational Therapy Session Note  Patient Details  Name: Larry Ortega MRN: [id number] Date of Birth: 17-Dec-1923  Today's Date: 04/30/2016 OT Individual Time: [id number] OT Individual Time Calculation (min): 75 min     Short Term Goals:Week 1:  OT Short Term Goal 1 (Week 1): Pt will complete grooming activity standing at sink with min A  OT Short Term Goal 2 (Week 1): Pt will complete shower transfer with min A  OT Short Term Goal 3 (Week 1): Pt will pull up pants completing sit to stand with min A   Skilled Therapeutic Interventions/Progress Updates:    Pt seen for skilled OT to facilitate dynamic balance, cognitive awareness of deficits, activity tolerance. Pt received in bed with family in the room. Pt was not able to recall me from yesterday or recall what we had done in therapy. He was able to recall that he showered and was agreeable to that this session. Family left. Pt used RW with improved management of it in small spaces. Improved memory to use hands on sitting surface to push up from vs reaching for the walker. Pt only needed one cue to stay seated when managing clothing over feet.  He demonstrated improved balance without a lean during standing during shower and grooming at the sink. Post self care, ambulated with RW to gym with postural cues. In gym, pt worked on postural strength with theraband shoulder retraction 20x.  Standing balance exercises and sit to stand without UE support. Slight A needed to come to stand and S to sit without UE.   Pill box test for cognition. Pt needed mod cues initially and then completed with min cues.  Good attention to task and reading directions on bottles. Pt took 15 min to complete test (vs 5 min norm) with 4-5 errors. Pt ambulated back to ADL apt to practice tub transfers with tub bench and then with shower chair. Pt prefers stepping in and out versus bench. He will need to have grab bars installed. OT will discuss with spouse. Ambulated back to room  and resting in recliner with quick release belt and call light in reach.  Therapy Documentation Precautions:  Precautions Precautions: Fall Restrictions Weight Bearing Restrictions: No    Vital Signs: Therapy Vitals Pulse Rate: (!) 56 BP: (!) 108/54 Pain: Pain Assessment Pain Assessment: No/denies pain ADL:  See Function Navigator for Current Functional Status.   Therapy/Group: Individual Therapy  [place] 04/30/2016, 9:00 AM

## 2016-04-30 NOTE — Progress Notes (Signed)
Physical Therapy Session Note  Patient Details  Name: Larry Ortega MRN: [id number] Date of Birth: Jun 18, 1924  Today's Date: 04/30/2016 PT Individual Time: 1104-1200 PT Individual Time Calculation (min): 56 min    Short Term Goals: Week 1:  PT Short Term Goal 1 (Week 1): = LTGs of overall supervision  Skilled Therapeutic Interventions/Progress Updates:    Patient in room with wife present. Session focused on functional ambulation without device or HHA with min-mod A overall throughout rehab unit up to 200 ft and cues for upright posture and decreasing speed for improved safety, therapeutic activity of making bed to challenge dynamic standing balance and activity tolerance including picking comforter up from floor and carrying pillows with steady assist > supervision, standing therex with UE support and mirror for visual feedback for standing marching and heel raises with min A for balance and cues for slow, controlled movement, and standing balance on firm surface with eyes open > eyes closed, standing on foam wedge with eyes open to challenge ankle balance strategies with close supervision-mod A, and standing on foam pad with min A overall while engaging in ball toss with dual naming tasks with min faded to mod cues and increased time. Patient left sitting in recliner with quick release belt and wife in room.   Therapy Documentation Precautions:  Precautions Precautions: Fall Restrictions Weight Bearing Restrictions: No Pain: Pain Assessment Pain Assessment: No/denies pain   See Function Navigator for Current Functional Status.   Therapy/Group: Individual Therapy  Laretta [facility]strom 04/30/2016, 12:43 PM

## 2016-04-30 NOTE — Progress Notes (Signed)
Speech Language Pathology Daily Session Note  Patient Details  Name: Larry Ortega MRN: [id number] Date of Birth: May 08, 1924  Today's Date: 04/30/2016 SLP Individual Time: 1300-1400 SLP Individual Time Calculation (min): 60 min   Short Term Goals: Week 1: SLP Short Term Goal 1 (Week 1): Pt demonstrate O x 4 with mod A.  SLP Short Term Goal 2 (Week 1): Pt tolerate regular diet with use of compensatory strategies to faciliatate oral clearance with min verbal cues.  SLP Short Term Goal 3 (Week 1): Pt to recall new basic information in 5 minute increments with mod A. SLP Short Term Goal 4 (Week 1): Pt to verbalize 2 limitations min A. SLP Short Term Goal 5 (Week 1): Pt complete basic problem solving within functional tasks with mod A.  Skilled Therapeutic Interventions: Skilled treatment session focused on cognitive goals. SLP facilitated session by providing Mod A verbal and visual cues for problem solving and organization during a basic money management task and a mildly complex checkbook balancing task. SLP also facilitated session by providing Max A multimodal cues for recall of his current medications and their functions. Both the patient and his wife verbalized appropriate emergent awareness in regards to difficulty of task. Patient left upright in bed with wife present. Continue with current plan of care.   Function:   Cognition Comprehension Comprehension assist level: Understands basic 75 - 89% of the time/ requires cueing 10 - 24% of the time  Expression   Expression assist level: Expresses basic 75 - 89% of the time/requires cueing 10 - 24% of the time. Needs helper to occlude trach/needs to repeat words.  Social Interaction Social Interaction assist level: Interacts appropriately 75 - 89% of the time - Needs redirection for appropriate language or to initiate interaction.  Problem Solving Problem solving assist level: Solves basic 50 - 74% of the time/requires cueing 25 - 49% of the  time  Memory Memory assist level: Recognizes or recalls 50 - 74% of the time/requires cueing 25 - 49% of the time    Pain No/Denies Pain   Therapy/Group: Individual Therapy  Aide Wojnar 04/30/2016, 3:10 PM

## 2016-04-30 NOTE — Consult Note (Signed)
NEUROBEHAVIORAL STATUS EXAM - [facility]   MEDICAL NECESSITY:  Larry Ortega was seen on the [facility] for a neurobehavioral status exam owing to the patient's diagnosis of encephalopathy, and to assist in treatment planning during admission.   Records indicate that Larry Ortega is a "82 y.o. male with history of CKD, lumbar stenosis with spondylolisthesis and LLE radiculopathy, B-CAS who was admitted on 04/20/16 with confusion, dizziness and facial droop. CT head negative for acute changes.  Neurology consulted and felt that patient with symptoms likely due to [name] but family deferred t-PA or CTA as patient without motor deficits.  Patient developed seizure activity X 2 while in ED and was loaded with Keppra and intubated for airway protection. LP done and patient started on antibiotics and antiviral due to concerns of meningitis. 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  Carotid dopplers showed severe calcific plaque origin of R-ICA/ECA 1-39% and moderate soft plaque throughout CCA with moderate mixed plaque at origin 1-39%.  EEG with nonspecific diffuse cerebral dysfunction.  He tolerated extubation on 08/01 and started on regular diet. LP with elevated protein and he was placed on IV acyclovir due to concerns of herpes encephalitis.  IGG+ but hsv DNA negative therefore acyclovir d/ced. Neurology felt that seizures were due to underlying cerebrovascular disease and to continue [place]. He continues to have confusion at nights and sustained a fall early his am--repeat CT head done showing stable right frontal encephalomalacia and old left BC infarct--no acute changes.  PT evaluation done revealing confusion, significant balance deficits and decreased awareness of deficits. CIR recommended for follow up therapy."  During today's visit, Larry Ortega reported  experiencing sudden onset cognitive issues that he feels are secondary to his present medical circumstances. He endorsed memory loss, decreased attention and concentration, as well as certain language changes (i.e., expressive dysphasia). He feels that these issues have been improving over time.   From an emotional standpoint, Larry Ortega mentioned being under a degree of stress lately since one of their closest friends passed away. Otherwise, he has been in generally good spirits even regarding this recent hospital admission.  He has no history of mental illness or need for psychiatric treatment. No adjustment issues endorsed. Suicidal/homicidal ideation, plan or intent was denied. No manic or hypomanic episodes were reported. The patient denied ever experiencing any auditory/visual hallucinations. No major behavioral or personality changes were endorsed.   Larry Ortega feels that progress is being made in therapy. No barriers to therapy identified. He described the rehab staff as "fine." He was unaware of his discharge date but is looking forward to going home. He lives with his wife who will assist with the transition home.   PROCEDURES: [2 units [id number] Diagnostic clinical interview  Review of available records Montreal Cognitive Assessment (short-form)  MENTAL STATUS: Larry Ortega mental status exam score of 13/22 was below the cutoff used to indicate severe cognitive impairment or dementia. Testing indicated poor encoding of novel material and markedly impaired free recall with little benefit noted via recognition cues, though multiple-choice was assistive. Patient was oriented to person, place, time, and situation. Expressive dysphasia and word finding difficulties were observed. 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 Processing speed and abstraction were reduced.    IMPRESSION: Overall, Mr. Days endorsed cognitive deficits secondary to his present medical circumstances. Brief mental  status exam suggests multiple deficits including areas of learning and memory,  language, and certain executive functions. These are at the level of dementia. Emotionally, while he indicated being somewhat distressed given the passing of a close friend, he is adjusting well to this present admission.   Larry Ortega performance is consistent with a preliminary diagnosis of Major Neurocognitive Disorder (i.e., dementia) with his complex medical circumstances being the most probable etiology. I recommend more comprehensive neuropsychological evaluation upon discharge to assess for interval change and to assist in treatment planning. I do not feel that neuropsychology needs to follow-up with the patient for the remainder of this admission.   PROVISIONAL DIAGNOSIS:  Major Neurocognitive Disorder (i.e., dementia) likely secondary to his present medical circumstances    Rutha Bouchard, Psy.D., [facility] Neuropsychologist

## 2016-05-01 ENCOUNTER — Inpatient Hospital Stay (HOSPITAL_COMMUNITY): Payer: Medicare Other | Admitting: Physical Therapy

## 2016-05-01 ENCOUNTER — Inpatient Hospital Stay (HOSPITAL_COMMUNITY): Payer: Medicare Other | Admitting: Speech Pathology

## 2016-05-01 ENCOUNTER — Inpatient Hospital Stay (HOSPITAL_COMMUNITY): Payer: Medicare Other | Admitting: Occupational Therapy

## 2016-05-01 LAB — URINALYSIS, ROUTINE W REFLEX MICROSCOPIC
BILIRUBIN URINE: NEGATIVE
Glucose, UA: NEGATIVE mg/dL
Hgb urine dipstick: NEGATIVE
KETONES UR: NEGATIVE mg/dL
Leukocytes, UA: NEGATIVE
NITRITE: NEGATIVE
PROTEIN: NEGATIVE mg/dL
Specific Gravity, Urine: 1.022 (ref 1.005–1.030)
pH: 5.5 (ref 5.0–8.0)

## 2016-05-01 NOTE — Progress Notes (Signed)
Occupational Therapy Session Note  Patient Details  Name: Larry Ortega MRN: [id number] Date of Birth: 02-14-1924  Today's Date: 05/01/2016 OT Individual Time: 0935-1030 OT Individual Time Calculation (min): 55 min     Short Term Goals:Week 1:  OT Short Term Goal 1 (Week 1): Pt will complete grooming activity standing at sink with min A  OT Short Term Goal 2 (Week 1): Pt will complete shower transfer with min A  OT Short Term Goal 3 (Week 1): Pt will pull up pants completing sit to stand with min A   Skilled Therapeutic Interventions/Progress Updates: Pt seen for skilled OT to facilitate balance, safety awareness, and cognition with ADL retraining. Pt demonstrated improved balance and awareness with all ADLs today. Good recall to reach back before sitting, but he continues to need cues to wait to stand until a helper is right next to him and to sit down to doff clothing over feet.  Overall S with self care and mobility with RW. Family ed with spouse and son in law with tub transfers in ADL apt. Pt demonstrated how he steps in and out of tub with 2 grab bars. Pt will need a shower seat and 2 grab bars.     Pt then ambulated to gym with RW to work on placing objects on stool with lateral lunging out to a 45* angle with stepping out and in. Pt initially very apprehensive about not using RW for support, reassured him therapist would support him. He was more fluid with R leg than left.  Pt worked on 10 steps out and in each leg, 2x.  Pt ambulated back to room, resting in recliner with quick release belt on and all needs met.   Therapy Documentation Precautions:  Precautions Precautions: Fall Restrictions Weight Bearing Restrictions: No  Pain: Pain Assessment Pain Assessment: No/denies pain ADL:    See Function Navigator for Current Functional Status.   Therapy/Group: Individual Therapy  [place] 05/01/2016, 9:04 AM

## 2016-05-01 NOTE — Progress Notes (Signed)
Speech Language Pathology Daily Session Note  Patient Details  Name: Larry Ortega MRN: [id number] Date of Birth: 04-Nov-1923  Today's Date: 05/01/2016 SLP Individual Time: 1030-1130 SLP Individual Time Calculation (min): 60 min   Short Term Goals: Week 1: SLP Short Term Goal 1 (Week 1): Pt demonstrate O x 4 with mod A.  SLP Short Term Goal 2 (Week 1): Pt tolerate regular diet with use of compensatory strategies to faciliatate oral clearance with min verbal cues.  SLP Short Term Goal 3 (Week 1): Pt to recall new basic information in 5 minute increments with mod A. SLP Short Term Goal 4 (Week 1): Pt to verbalize 2 limitations min A. SLP Short Term Goal 5 (Week 1): Pt complete basic problem solving within functional tasks with mod A.  Skilled Therapeutic Interventions: Skilled treatment session focused on cognitive goals. Patient utilized external memory aid to recall his current medications and their functions with Min A question cues and organized a 2 time per day pill box with Min-Mod A verbal and questions cues for the mildly complex problem solving task. Patient also "taught" the clinician a familiar card game he plays with his wife with Min A question cues. Patient attended to tall tasks in a minimally distracting environment with supervision verbal cues for redirection. Patient left upright in recliner with wife present. Continue with current plan of care.   Function:   Cognition Comprehension Comprehension assist level: Understands basic 75 - 89% of the time/ requires cueing 10 - 24% of the time  Expression   Expression assist level: Expresses basic 75 - 89% of the time/requires cueing 10 - 24% of the time. Needs helper to occlude trach/needs to repeat words.  Social Interaction Social Interaction assist level: Interacts appropriately 75 - 89% of the time - Needs redirection for appropriate language or to initiate interaction.  Problem Solving Problem solving assist level: Solves basic  75 - 89% of the time/requires cueing 10 - 24% of the time  Memory Memory assist level: Recognizes or recalls 75 - 89% of the time/requires cueing 10 - 24% of the time    Pain Pain Assessment Pain Assessment: No/denies pain  Therapy/Group: Individual Therapy  Nekeya Briski 05/01/2016, 12:01 PM

## 2016-05-01 NOTE — Progress Notes (Signed)
Social Work Patient ID: Larry Ortega, male   DOB: [birth year], 82 y.o.   MRN: [id number]   Met with pt and wife following team conference.  Both aware and agreeable with target d/c for Fri 8/11 and supervision goals.  Discussed recommendation for OP tx f/u and they are agreeable with arranging this at [facility].    Angeles Zehner, LCSW

## 2016-05-01 NOTE — Patient Care Conference (Signed)
Inpatient RehabilitationTeam Conference and Plan of Care Update Date: 05/01/2016   Time: 11:30 AM    Patient Name: Larry Ortega      Medical Record Number: [id number]  Date of Birth: 1923/11/11 Sex: Male         Room/Bed: 4W13C/4W13C-01 Payor Info: Payor: MEDICARE / Plan: MEDICARE PART A AND B / Product Type: *No Product type* /    Admitting Diagnosis: Encephalopathy  Admit Date/Time:  04/25/2016  6:00 PM Admission Comments: No comment available   Primary Diagnosis:  Encephalopathy Principal Problem: Encephalopathy  Patient Active Problem List   Diagnosis Date Noted  . Encephalopathy 04/25/2016  . Essential hypertension 04/25/2016  . Seizures ([place]) 04/25/2016  . Aphasia   . Acute respiratory failure ([place])   . Cerebral infarction due to unspecified mechanism 04/20/2016  . Cerebrovascular accident (CVA) due to thrombosis of precerebral artery ([place]) 04/20/2016  . Cerebral infarction due to thrombosis of precerebral artery ([place])   . Carotid stenosis 09/26/2015    Expected Discharge Date: Expected Discharge Date: 05/03/16  Team Members Present: Physician leading conference: Dr. Alger Simons Social Worker Present: Lennart Pall, LCSW Nurse Present: Other (comment) Genene Churn, RN) PT Present: Carney Living, PT OT Present: Meriel Pica, OT SLP Present: Weston Anna, SLP PPS Coordinator present : Daiva Nakayama, RN, CRRN     Current Status/Progress Goal Weekly Team Focus  Medical   Diarrhea resolved, now with urinary frequency and some burning with urination, encephalopathy, improving  Manage bladder issues, blood pressure, pulse issues during inpatient rehabilitation  Evaluate urinary frequency. Check urinalysis. Consider anticholinergic medications   Bowel/Bladder   continent of bowel & bladder, LBM 04/30/16- loose stools have ceased since yesterday  continue to be continent  continue to assist with transfers to toileting   Swallow/Nutrition/ Hydration   Regular textures with  thin liquids, Mod I-Supervision  Mod I  tolerance of diet, use of swallow strategies    ADL's   overall S with basic self care, steadying A with dynamic reaching to floor level, meal prep to be evaluated  S with ADLs, standing balance, memory, simple meal prep  ADL retraining, balance training, cognitive activities, pt/family education   Mobility   min-mod A community distances, impulsive, decreased safety awareness  supervision  functional mobility training, standing balance, cognitive remediation, safety awareness, pt/family education   Communication             Safety/Cognition/ Behavioral Observations  Mod A   Min A  recall, attention, basic problem solving    Pain   last c/o pain on 04/27/16, had Tylenol 650mg  on that day for pain scale of 2  pain scle less than 3  continue to assess & treat as needed   Skin   no skin issues  no new skin breakdown  continue to assess skin q shift    Rehab Goals Patient on target to meet rehab goals: Yes *See Care Plan and progress notes for long and short-term goals.  Barriers to Discharge: Still with fluctuating blood pressures    Possible Resolutions to Barriers:  Continue medication management with adjustment of beta blocker     Discharge Planning/Teaching Needs:  Plan is to d/c home with wife who can provide supervision. Intermittent support of dtr and son-in-law.  Teaching to be scheduled closer to d/c.   Team Discussion:  Doing better today physically and cognitively.  Supervision with mobility.  Improving cognitively but difficult to determine old vs new issues.  Recommend OP tx f/u.  Revisions  to Treatment Plan:  None   Continued Need for Acute Rehabilitation Level of Care: The patient requires daily medical management by a physician with specialized training in physical medicine and rehabilitation for the following conditions: Daily direction of a multidisciplinary physical rehabilitation program to ensure safe treatment while  eliciting the highest outcome that is of practical value to the patient.: Yes Daily medical management of patient stability for increased activity during participation in an intensive rehabilitation regime.: Yes Daily analysis of laboratory values and/or radiology reports with any subsequent need for medication adjustment of medical intervention for : Neurological problems;Urological problems;Blood pressure problems  Izzabell Klasen 05/01/2016, 1:08 PM

## 2016-05-01 NOTE — Progress Notes (Signed)
Physical Therapy Session Note  Patient Details  Name: Larry Ortega MRN: [id number] Date of Birth: 1923/12/12  Today's Date: 05/01/2016 PT Individual Time: 0800-0910 and 1305-1330 PT Individual Time Calculation (min): 70 min and 25 min   Short Term Goals: Week 1:  PT Short Term Goal 1 (Week 1): = LTGs of overall supervision  Skilled Therapeutic Interventions/Progress Updates:    Treatment 1: Patient sitting in recliner, donned socks and shoes with setup assist and wife departing at beginning of session. Session focused on functional ambulation with hand held steady assist x 150 ft, functional ambulation using RW to navigate narrow space in ADL apartment bathroom with supervision and verbal cues for safety, toilet transfers and toileting tasks with distant supervision, attention and functional recall tasks for following directions to navigate from therapy gym to main entrance of hospital with mod-max cues after approx 5 min delay, and functional ambulation using RW from therapy gym to and from main entrance of hospital 2 x > 1000 ft with supervision and cues for upright posture/hip extension to neutral due to progressive forward flexed trunk with increased fatigue. Patient required verbal cues throughout session for obstacle negotiation using RW and safe hand placement with sit <> stand transfers. During rest breaks, reviewed discharge plans and f/u OPPT recommendation as well as general safety recommendations in and around home/yard. Patient required mod-max cues for attention to task and noted difficulty with divided attention during ambulation while engaging in conversation. Patient demonstrated improved emergent awareness of physical/cognitive deficits this date. Patient left sitting in recliner with quick release belt and call bell in reach.   Treatment 2: Patient in recliner with wife in room asking about helping patient to bathroom. Completed family training with wife to assist patient to and  from bathroom using RW with supervision-steady assist and verbal cues for safety with turning, keeping B feet inside RW, and hand placement for sit <> stand transfers. Wife cleared to assist patient in room, safety plan updated. Gait training 2 x 150 ft using RW with supervision. Stair training using 1 rail only up/down 12 (6") stairs to simulate home access to basement level with patient self-electing step-to or reciprocal pattern with supervision. Patient instructed in floor transfer training using therapy mat for UE support with initial demonstration and supervision. Patient returned to room, reinforced safety recommendations to wait for wife to be within arm's reach before standing and reviewed safe transfers using RW with patient and wife who both verbalized understanding. Patient left in room with wife present.   Therapy Documentation Precautions:  Precautions Precautions: Fall Restrictions Weight Bearing Restrictions: No Pain: Pain Assessment Pain Assessment: No/denies pain   See Function Navigator for Current Functional Status.   Therapy/Group: Individual Therapy  Laretta Alstrom 05/01/2016, 10:41 AM

## 2016-05-01 NOTE — Progress Notes (Signed)
[facility]IT[facility]     PROGRESS NOTE    Subjective/Complaints: Poor sleep last night related to frequent urination, has some burning with urination, no fevers or chills.   ROS: Denies CP, SOB, N/V/D.  Objective: Vital Signs: Blood pressure 140/60, pulse (!) 58, temperature 98.3 F (36.8 C), temperature source Oral, resp. rate 18, height _0  (1.803 m), weight 67.7 kg (149 lb 3.2 oz), SpO2 97 %. No results found. No results for input(s): WBC, HGB, HCT, PLT in the last 72 hours.  Recent Labs  04/29/16 0602 04/30/16 0707  NA 137 139  K 3.6 3.4*  CL 106 106  GLUCOSE 129* 119*  BUN 27* 25*  CREATININE 1.30* 1.17  CALCIUM 9.3 8.8*   CBG (last 3)  No results for input(s): GLUCAP in the last 72 hours.  Wt Readings from Last 3 Encounters:  05/01/16 67.7 kg (149 lb 3.2 oz)  04/25/16 66.3 kg (146 lb 2.6 oz)  04/02/16 72.6 kg (160 lb)    Physical Exam:  Constitutional: NAD. Vital signs reviewed.  HENT: Normocephalic and atraumatic.  Eyes: EOM and Conj are normal.  Cardiovascular: Normal rate and regular rhythm.  No murmur heard. Respiratory: Effort normal and breath sounds normal. No stridor. No respiratory distress. He has no wheezes.  GI: Soft. Bowel sounds are normal. He exhibits no distension. There is no tenderness.  Musculoskeletal: He exhibits no edema or tenderness.  Neurological: He is alert and oriented x3.  Motor: 4+/5 throughout.   Skin: Skin is warm and dry. No erythema.  Psychiatric: His mood and affect appear normal.  In good spirits  Assessment/Plan: 1. Cognitive, mobility, and functional deficits secondary to encephalopathy which require 3+ hours per day of interdisciplinary therapy in a comprehensive inpatient rehab setting. Physiatrist is providing close team supervision and 24 hour management of active medical problems listed below. Physiatrist and rehab team continue to assess barriers to discharge/monitor patient progress  toward functional and medical goals.  Function:  Bathing Bathing position   Position: Shower  Bathing parts Body parts bathed by patient: Right arm, Back, Left arm, Chest, Abdomen, Front perineal area, Buttocks, Right upper leg, Left upper leg, Right lower leg, Left lower leg    Bathing assist Assist Level: Supervision or verbal cues      Upper Body Dressing/Undressing Upper body dressing   What is the patient wearing?: Pull over shirt/dress     Pull over shirt/dress - Perfomed by patient: Thread/unthread right sleeve, Thread/unthread left sleeve, Put head through opening, Pull shirt over trunk          Upper body assist Assist Level: Set up      Lower Body Dressing/Undressing Lower body dressing   What is the patient wearing?: Underwear, Pants, Socks, Shoes Underwear - Performed by patient: Thread/unthread right underwear leg, Thread/unthread left underwear leg, Pull underwear up/down   Pants- Performed by patient: Thread/unthread right pants leg, Thread/unthread left pants leg, Pull pants up/down, Fasten/unfasten pants       Socks - Performed by patient: Don/doff right sock, Don/doff left sock   Shoes - Performed by patient: Don/doff right shoe, Don/doff left shoe, Fasten right, Fasten left            Lower body assist Assist for lower body dressing: Supervision or verbal cues      Toileting Toileting   Toileting steps completed by patient: Adjust clothing prior to toileting, Performs perineal hygiene, Adjust clothing after toileting Toileting steps completed by helper: Adjust  clothing after toileting Toileting Assistive Devices: Grab bar or rail  Toileting assist Assist level: Supervision or verbal cues   Transfers Chair/bed transfer Chair/bed transfer activity did not occur: N/A Chair/bed transfer method: Ambulatory Chair/bed transfer assist level: Touching or steadying assistance (Pt > 75%) Chair/bed transfer assistive device: Walker, Bedrails, Armrests      Locomotion Ambulation     Max distance: 200 ft Assist level: Moderate assist (Pt 50 - 74%)   Wheelchair          Cognition Comprehension Comprehension assist level: Understands basic 75 - 89% of the time/ requires cueing 10 - 24% of the time  Expression Expression assist level: Expresses basic 75 - 89% of the time/requires cueing 10 - 24% of the time. Needs helper to occlude trach/needs to repeat words.  Social Interaction Social Interaction assist level: Interacts appropriately 75 - 89% of the time - Needs redirection for appropriate language or to initiate interaction.  Problem Solving Problem solving assist level: Solves basic 50 - 74% of the time/requires cueing 25 - 49% of the time  Memory Memory assist level: Recognizes or recalls 50 - 74% of the time/requires cueing 25 - 49% of the time   Medical Problem List and Plan: 1.  Cognitive, mobility and functional deficits secondary to encephalopathy and seizures  -Cont CIR therapies, PT, OT, speech, Team conference today please see physician documentation under team conference tab, met with team face-to-face to discuss problems,progress, and goals. Formulized individual treatment plan based on medical history, underlying problem and comorbidities. 2.  DVT Prophylaxis/Anticoagulation:   Lovenox 3. Pain Management: Tylenol prn. Denies pain at this point 4. Mood: LCSW to follow for evaluation and support.  5. Neuropsych: This patient is not capable of making decisions on his own behalf.  -normalize sleep wake cycle 6. Skin/Wound Care: Routine pressure relief measures. Maintain adequate nutrition and hydration status.  7. Fluids/Electrolytes/Nutrition:   -mild hypokalemia---repleted  -pre-renal azotemia---cont to encourage fluids    8. New onset seizures: On keppra bid. Discussed seizure medication with son 63. HTN: Monitor BID. Continue metoprolol, reduce dosage given bradycardia and low systolic  Fairly well controlled 10 GERD:  Resumed PPI. 11. Altered mental status/delirium:   Seroquel prn for agitation/sundowning.   -monitor sleep-wake pattern   -Appears to have resolved 12. Hypertensive heart disease: On ASA/Plavix/Zocor 13. Diarrhea , resolved 14. Urinary frequency and burning, check UA LOS (Days) 6 A FACE TO FACE EVALUATION WAS PERFORMED  Alysia Penna E 05/01/2016 8:52 AM

## 2016-05-02 ENCOUNTER — Inpatient Hospital Stay (HOSPITAL_COMMUNITY): Payer: Medicare Other | Admitting: Physical Therapy

## 2016-05-02 ENCOUNTER — Inpatient Hospital Stay (HOSPITAL_COMMUNITY): Payer: Medicare Other | Admitting: Occupational Therapy

## 2016-05-02 ENCOUNTER — Inpatient Hospital Stay (HOSPITAL_COMMUNITY): Payer: Medicare Other | Admitting: Speech Pathology

## 2016-05-02 DIAGNOSIS — R7989 Other specified abnormal findings of blood chemistry: Secondary | ICD-10-CM

## 2016-05-02 DIAGNOSIS — R4701 Aphasia: Secondary | ICD-10-CM

## 2016-05-02 DIAGNOSIS — E876 Hypokalemia: Secondary | ICD-10-CM

## 2016-05-02 MED ORDER — POTASSIUM CHLORIDE CRYS ER 20 MEQ PO TBCR
20.0000 meq | EXTENDED_RELEASE_TABLET | Freq: Two times a day (BID) | ORAL | Status: AC
  Administered 2016-05-02 (×2): 20 meq via ORAL
  Filled 2016-05-02 (×2): qty 1

## 2016-05-02 MED ORDER — ACETAMINOPHEN 325 MG PO TABS: 325.0000 mg | ORAL_TABLET | Freq: Four times a day (QID) | ORAL | Status: AC | PRN

## 2016-05-02 MED ORDER — SIMVASTATIN 20 MG PO TABS: 20.0000 mg | ORAL_TABLET | Freq: Every day | ORAL | Status: AC

## 2016-05-02 MED ORDER — METOPROLOL TARTRATE 25 MG PO TABS: 12.5000 mg | ORAL_TABLET | Freq: Two times a day (BID) | ORAL | Status: DC

## 2016-05-02 MED ORDER — LEVETIRACETAM 500 MG PO TABS: 500.0000 mg | ORAL_TABLET | Freq: Two times a day (BID) | ORAL | 0 refills | Status: DC

## 2016-05-02 NOTE — Progress Notes (Signed)
Social Work Patient ID: Bolivar Figura, male   DOB: June 22, 1924, 82 y.o.   MRN: [id number]   Lowella Curb, LCSW Social Worker Signed   Patient Care Conference Date of Service: 05/01/2016  1:08 PM      Hide copied text Hover for attribution information Inpatient RehabilitationTeam Conference and Plan of Care Update Date: 05/01/2016   Time: 11:30 AM      Patient Name: Larry Ortega      Medical Record Number: [id number]  Date of Birth: 11-16-1923 Sex: Male         Room/Bed: 4W13C/4W13C-01 Payor Info: Payor: MEDICARE / Plan: MEDICARE PART A AND B / Product Type: *No Product type* /     Admitting Diagnosis: Encephalopathy  Admit Date/Time:  04/25/2016  6:00 PM Admission Comments: No comment available    Primary Diagnosis:  Encephalopathy Principal Problem: Encephalopathy       Patient Active Problem List    Diagnosis Date Noted  . Encephalopathy 04/25/2016  . Essential hypertension 04/25/2016  . Seizures ([place]) 04/25/2016  . Aphasia    . Acute respiratory failure ([place])    . Cerebral infarction due to unspecified mechanism 04/20/2016  . Cerebrovascular accident (CVA) due to thrombosis of precerebral artery ([place]) 04/20/2016  . 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 Carotid stenosis 09/26/2015      Expected Discharge Date: Expected Discharge Date: 05/03/16   Team Members Present: Physician leading conference: Dr. Alger Simons Social Worker Present: Lennart Pall, LCSW Nurse Present: Other (comment) Genene Churn, RN) PT Present: Carney Living, PT OT Present: Meriel Pica, OT SLP Present: Weston Anna, SLP PPS Coordinator present : Daiva Nakayama, RN, CRRN       Current Status/Progress Goal Weekly Team Focus  Medical   Diarrhea resolved, now with urinary frequency and some burning with urination, encephalopathy, improving  Manage bladder issues, blood pressure, pulse issues during inpatient rehabilitation  Evaluate urinary frequency. Check urinalysis.  Consider anticholinergic medications   Bowel/Bladder   continent of bowel & bladder, LBM 04/30/16- loose stools have ceased since yesterday  continue to be continent  continue to assist with transfers to toileting   Swallow/Nutrition/ Hydration   Regular textures with thin liquids, Mod I-Supervision  Mod I  tolerance of diet, use of swallow strategies    ADL's   overall S with basic self care, steadying A with dynamic reaching to floor level, meal prep to be evaluated  S with ADLs, standing balance, memory, simple meal prep  ADL retraining, balance training, cognitive activities, pt/family education   Mobility   min-mod A community distances, impulsive, decreased safety awareness  supervision  functional mobility training, standing balance, cognitive remediation, safety awareness, pt/family education   Communication             Safety/Cognition/ Behavioral Observations Mod A   Min A  recall, attention, basic problem solving    Pain   last c/o pain on 04/27/16, had Tylenol 650mg  on that day for pain scale of 2  pain scle less than 3  continue to assess & treat as needed   Skin   no skin issues  no new skin breakdown  continue to assess skin q shift     Rehab Goals Patient on target to meet rehab goals: Yes *See Care Plan and progress notes for long and short-term goals.   Barriers to Discharge: Still with fluctuating blood pressures   Possible Resolutions to Barriers:  Continue medication management with adjustment of  beta blocker    Discharge Planning/Teaching Needs:  Plan is to d/c home with wife who can provide supervision. Intermittent support of dtr and son-in-law.  Teaching to be scheduled closer to d/c.   Team Discussion:  Doing better today physically and cognitively.  Supervision with mobility.  Improving cognitively but difficult to determine old vs new issues.  Recommend OP tx f/u.  Revisions to Treatment Plan:  None    Continued Need for Acute Rehabilitation Level of Care: The  patient requires daily medical management by a physician with specialized training in physical medicine and rehabilitation for the following conditions: Daily direction of a multidisciplinary physical rehabilitation program to ensure safe treatment while eliciting the highest outcome that is of practical value to the patient.: Yes Daily medical management of patient stability for increased activity during participation in an intensive rehabilitation regime.: Yes Daily analysis of laboratory values and/or radiology reports with any subsequent need for medication adjustment of medical intervention for : Neurological problems;Urological problems;Blood pressure problems   Mekhia Brogan 05/01/2016, 1:08 PM

## 2016-05-02 NOTE — Plan of Care (Signed)
Problem: RH BLADDER ELIMINATION Goal: RH STG MANAGE BLADDER WITH ASSISTANCE STG Manage Bladder With mod I Assistance  Outcome: Completed/Met Date Met: 05/02/16 Wife checked off to transfer patient to bathroom

## 2016-05-02 NOTE — Progress Notes (Signed)
Physical Therapy Discharge Summary  Patient Details  Name: Larry Ortega MRN: [id number] Date of Birth: 07/31/1924  Today's Date: 05/02/2016 PT Individual Time: 5885-0277 PT Individual Time Calculation (min): 75 min   Patient has met 12 of 12 long term goals due to improved activity tolerance, improved balance, improved postural control, increased strength, improved attention and improved awareness.  Patient to discharge at an ambulatory level Supervision.   Patient's care partner is independent to provide the necessary physical and cognitive assistance at discharge.  Reasons goals not met: NA  Recommendation:  Patient will benefit from ongoing skilled PT services in outpatient setting to continue to advance safe functional mobility, address ongoing impairments in dynamic standing balance, endurance, safety awareness, and minimize fall risk.  Equipment: No equipment provided-patient owns RW  Reasons for discharge: treatment goals met and discharge from hospital  Patient/family agrees with progress made and goals achieved: Yes  Skilled Therapeutic Intervention Family training completed with patient and wife providing supervision for functional ambulation up to 500 ft and up/down ramp and across uneven wood chip surface using RW, up/down curb using RW with cues for technique, up/down 12 stairs using 2 rails, simulated car transfer to sedan height, picking up items from floor, and floor transfer using therapy mat for UE support. Patient continues to require cues for safe hand placement and RW management for sit <> stand transfers. Patient demonstrates high fall risk as noted by score of 37/56 on Berg Balance Scale, improved from 11/56 on evaluation. Recommending 24/7 supervision at DC, patient and wife verbalized understanding. Patient instructed in standing OTAGO Level A Challenge HEP and provided handout with bilateral UE support as needed for knee flexion x 10 each LE, squats x 10, hip  abduction x 10 each LE, sit <> stand without UE support x 10, and tandem stance leading with R/L LE up to 30 sec at at time. Patient and wife with no further questions/concerns regarding DC planned home tomorrow. Patient left sitting in recliner with wife present.   PT Discharge Precautions/RestrictionsPrecautions Precautions: Fall Restrictions Weight Bearing Restrictions: No Pain Pain Assessment Pain Assessment: No/denies pain Vision/Perception  Perception Comments: WFL  Cognition Overall Cognitive Status: Impaired/Different from baseline Arousal/Alertness: Awake/alert Orientation Level: Oriented X4 Attention: Selective Selective Attention: Impaired Selective Attention Impairment: Verbal basic Memory: Impaired Memory Impairment: Retrieval deficit;Decreased recall of new information Decreased Short Term Memory: Verbal basic;Functional basic Awareness: Impaired Awareness Impairment: Emergent impairment Problem Solving: Impaired Problem Solving Impairment: Verbal complex Executive Function:  (all limited by underlying deficit) Behaviors: Impulsive Safety/Judgment: Impaired Sensation Sensation Light Touch: Impaired Detail Light Touch Impaired Details: Impaired LLE;Impaired RLE Stereognosis: Appears Intact Hot/Cold: Appears Intact Proprioception: Appears Intact Additional Comments: numbness in lateral aspect of B lower legs Coordination Gross Motor Movements are Fluid and Coordinated: Yes Fine Motor Movements are Fluid and Coordinated: Yes Motor  Motor Motor: Abnormal postural alignment and control;Ataxia Motor - Discharge Observations: mild LLE ataxia during ambulation, impaired balance strategies but improved from evaluation  Mobility Bed Mobility Bed Mobility: Supine to Sit;Rolling Right;Rolling Left;Sit to Supine Rolling Right: 6: Modified independent (Device/Increase time) Rolling Left: 6: Modified independent (Device/Increase time) Supine to Sit: 6: Modified  independent (Device/Increase time) Sit to Supine: 6: Modified independent (Device/Increase time) Transfers Transfers: Yes Sit to Stand: 5: Supervision Stand to Sit: 5: Supervision Locomotion  Ambulation Ambulation: Yes Ambulation/Gait Assistance: 5: Supervision Ambulation Distance (Feet): 200 Feet Assistive device: Rolling walker Gait Gait: Yes Gait Pattern: Impaired Gait Pattern: Step-through pattern;Trunk flexed;Ataxic Gait velocity: WFL, improved safety  awareness and control Stairs / Additional Locomotion Stairs: Yes Stairs Assistance: 5: Supervision Stair Management Technique: Two rails;Alternating pattern Number of Stairs: 12 Height of Stairs: 6 Ramp: 5: Supervision Curb: 5: Supervision Wheelchair Mobility Wheelchair Mobility: No  Trunk/Postural Assessment  Cervical Assessment Cervical Assessment: Within Functional Limits Thoracic Assessment Thoracic Assessment: Within Functional Limits Lumbar Assessment Lumbar Assessment: Within Functional Limits Postural Control Protective Responses: impaired, improved from eval  Balance Balance Balance Assessed: Yes Standardized Balance Assessment Standardized Balance Assessment: Berg Balance Test Berg Balance Test Sit to Stand: Able to stand without using hands and stabilize independently Standing Unsupported: Able to stand safely 2 minutes Sitting with Back Unsupported but Feet Supported on Floor or Stool: Able to sit safely and securely 2 minutes Stand to Sit: Sits safely with minimal use of hands Transfers: Able to transfer with verbal cueing and /or supervision Standing Unsupported with Eyes Closed: Able to stand 10 seconds with supervision Standing Ubsupported with Feet Together: Able to place feet together independently but unable to hold for 30 seconds From Standing, Reach Forward with Outstretched Arm: Can reach forward >12 cm safely (5") From Standing Position, Pick up Object from Floor: Able to pick up shoe, needs  supervision From Standing Position, Turn to Look Behind Over each Shoulder: Looks behind one side only/other side shows less weight shift Turn 360 Degrees: Needs close supervision or verbal cueing Standing Unsupported, Alternately Place Feet on Step/Stool: Needs assistance to keep from falling or unable to try Standing Unsupported, One Foot in Front: Loses balance while stepping or standing Standing on One Leg: Tries to lift leg/unable to hold 3 seconds but remains standing independently Total Score: 34 Static Sitting Balance Static Sitting - Level of Assistance: 7: Independent Dynamic Sitting Balance Dynamic Sitting - Level of Assistance: 7: Independent Static Standing Balance Static Standing - Level of Assistance: 5: Stand by assistance Dynamic Standing Balance Dynamic Standing - Balance Support: During functional activity;Left upper extremity supported Dynamic Standing - Level of Assistance: 5: Stand by assistance Extremity Assessment  RUE Assessment RUE Assessment: Within Functional Limits LUE Assessment LUE Assessment: Within Functional Limits RLE Assessment RLE Assessment: Within Functional Limits LLE Assessment LLE Assessment: Within Functional Limits   See Function Navigator for Current Functional Status.  Carney Living A 05/02/2016, 8:02 AM

## 2016-05-02 NOTE — Progress Notes (Signed)
Speech Language Pathology Discharge Summary  Patient Details  Name: Larry Ortega MRN: [id number] Date of Birth: [birth year]  Today's Date: 05/02/2016 SLP Individual Time: 1100-1205 SLP Individual Time Calculation (min): 65 min    Skilled Therapeutic Interventions:  Pt seen for skilled SLP treatment targeting cognitive goals. Pt is demonstrating emergent awareness of deficits, but is more compliant and agreeable to assist in discussion with therapist than when family is present. Pt able to identify physical and cognitive limitations at mod I and requirement for supervision for safety at mod I - min A. Pt unable to recall meds independently, but when provided with med list, pt able to comprehend information with min A. Writing is much improved this date- legible at sentence level. Discussed using written language as a compensatory strategy for memory impairment. Again reviewed importance of 24/7 supervision with pt and his spouse- both verbalized agreement.      Patient has met 4 of 4 long term goals.  Patient to discharge at overall Supervision level.  Reasons goals not met:     Clinical Impression/Discharge Summary:  Pt has made gains particularly with awareness of impairments, but continues to require min verbal cueing for recall and problem solving. Spouse is demonstrating ability to cue pt appropriately. Pt would benefit from ongoing outpt SLP services to maximize functional cognitive-linguistic gains.  Care Partner:  Caregiver Able to Provide Assistance: Yes  Type of Caregiver Assistance: Cognitive  Recommendation:  Outpatient SLP;24 hour supervision/assistance  Rationale for SLP Follow Up: Maximize cognitive function and independence;Reduce caregiver burden   Equipment:     Reasons for discharge: Discharged from hospital   Patient/Family Agrees with Progress Made and Goals Achieved: Yes   Function:  Eating Eating     Eating Assist Level: Supervision or verbal cues           Cognition Comprehension Comprehension assist level: Understands basic 90% of the time/cues < 10% of the time  Expression   Expression assist level: Expresses basic 90% of the time/requires cueing < 10% of the time.  Social Interaction Social Interaction assist level: Interacts appropriately with others with medication or extra time (anti-anxiety, antidepressant).  Problem Solving Problem solving assist level: Solves basic 75 - 89% of the time/requires cueing 10 - 24% of the time  Memory Memory assist level: Recognizes or recalls 75 - 89% of the time/requires cueing 10 - 24% of the time   KHAMAURI BAUERNFEIND MA, CCC-SLP 05/02/2016, 12:32 PM

## 2016-05-02 NOTE — Progress Notes (Signed)
[facility] PHYSICAL MEDICINE & REHABILITATION     PROGRESS NOTE    Subjective/Complaints: Poor sleep last night related to frequent urination, has some burning with urination, no fevers or chills.   ROS: Denies CP, SOB, N/V/D.  Objective: Vital Signs: Blood pressure 128/60, pulse (!) 57, temperature 98.1 F (36.7 C), temperature source Oral, resp. rate 16, height 5\' 11"  (1.803 m), weight 69.1 kg (152 lb 6.4 oz), SpO2 97 %. No results found. No results for input(s): WBC, HGB, HCT, PLT in the last 72 hours.  Recent Labs  04/30/16 0707  NA 139  K 3.4*  CL 106  GLUCOSE 119*  BUN 25*  CREATININE 1.17  CALCIUM 8.8*   CBG (last 3)  No results for input(s): GLUCAP in the last 72 hours.  Wt Readings from Last 3 Encounters:  05/02/16 69.1 kg (152 lb 6.4 oz)  04/25/16 66.3 kg (146 lb 2.6 oz)  04/02/16 72.6 kg (160 lb)    Physical Exam:  Constitutional: NAD. Vital signs reviewed.  HENT: Normocephalic and atraumatic.  Eyes: EOM and Conj are normal.  Cardiovascular: Normal rate and regular rhythm.  No murmur heard. Respiratory: Effort normal and breath sounds normal. No stridor. No respiratory distress. He has no wheezes.  GI: Soft. Bowel sounds are normal. He exhibits no distension. There is no tenderness.  Musculoskeletal: He exhibits no edema or tenderness.  Neurological: He is alert and oriented x3.  Motor: 4+/5 throughout.   Skin: Skin is warm and dry. No erythema.  Psychiatric: His mood and affect appear normal.  In good spirits  Assessment/Plan: 1. Cognitive, mobility, and functional deficits secondary to encephalopathy which require 3+ hours per day of interdisciplinary therapy in a comprehensive inpatient rehab setting. Physiatrist is providing close team supervision and 24 hour management of active medical problems listed below. Physiatrist and rehab team continue to assess barriers to discharge/monitor patient progress toward functional and medical  goals.  Function:  Bathing Bathing position   Position: Shower  Bathing parts Body parts bathed by patient: Right arm, Back, Left arm, Chest, Abdomen, Front perineal area, Buttocks, Right upper leg, Left upper leg, Right lower leg, Left lower leg    Bathing assist Assist Level: Supervision or verbal cues      Upper Body Dressing/Undressing Upper body dressing   What is the patient wearing?: Pull over shirt/dress     Pull over shirt/dress - Perfomed by patient: Thread/unthread right sleeve, Thread/unthread left sleeve, Put head through opening, Pull shirt over trunk          Upper body assist Assist Level: Set up      Lower Body Dressing/Undressing Lower body dressing   What is the patient wearing?: Underwear, Pants, Socks, Shoes Underwear - Performed by patient: Thread/unthread right underwear leg, Thread/unthread left underwear leg, Pull underwear up/down   Pants- Performed by patient: Thread/unthread right pants leg, Thread/unthread left pants leg, Pull pants up/down, Fasten/unfasten pants       Socks - Performed by patient: Don/doff right sock, Don/doff left sock   Shoes - Performed by patient: Don/doff right shoe, Don/doff left shoe, Fasten right, Fasten left            Lower body assist Assist for lower body dressing: Supervision or verbal cues      Toileting Toileting   Toileting steps completed by patient: Adjust clothing prior to toileting, Performs perineal hygiene, Adjust clothing after toileting Toileting steps completed by helper: Adjust clothing after toileting Toileting Assistive Devices: Grab bar or  rail  Toileting assist Assist level: Supervision or verbal cues   Transfers Chair/bed transfer Chair/bed transfer activity did not occur: N/A Chair/bed transfer method: Ambulatory Chair/bed transfer assist level: Supervision or verbal cues Chair/bed transfer assistive device: Walker, Air cabin crew     Max distance: 1000  ft Assist level: Supervision or verbal cues   Wheelchair          Cognition Comprehension Comprehension assist level: Understands basic 75 - 89% of the time/ requires cueing 10 - 24% of the time  Expression Expression assist level: Expresses basic 75 - 89% of the time/requires cueing 10 - 24% of the time. Needs helper to occlude trach/needs to repeat words.  Social Interaction Social Interaction assist level: Interacts appropriately 75 - 89% of the time - Needs redirection for appropriate language or to initiate interaction.  Problem Solving Problem solving assist level: Solves basic 75 - 89% of the time/requires cueing 10 - 24% of the time  Memory Memory assist level: Recognizes or recalls 75 - 89% of the time/requires cueing 10 - 24% of the time   Medical Problem List and Plan: 1.  Cognitive, mobility and functional deficits secondary to encephalopathy and seizures  -Cont CIR therapies, PT, OT, speech, Planned discharge tomorrow 2.  DVT Prophylaxis/Anticoagulation:   Lovenox 3. Pain Management: Tylenol prn. Denies pain at this point 4. Mood: LCSW to follow for evaluation and support.  5. Neuropsych: This patient is not capable of making decisions on his own behalf.  -normalize sleep wake cycle 6. Skin/Wound Care: Routine pressure relief measures. Maintain adequate nutrition and hydration status.  7. Fluids/Electrolytes/Nutrition:   -mild hypokalemia---repleted  -pre-renal azotemia---cont to encourage fluids    8. New onset seizures: On keppra bid. Discussed seizure medication with son 85. HTN: Monitor BID. Continue metoprolol, reduced dosage given bradycardia and low systolic, resolving   Vitals:   05/01/16 1957 05/02/16 0536  BP: 134/67 128/60  Pulse: 60 (!) 57  Resp:  16  Temp:  98.1 F (36.7 C)   10 GERD: Resumed PPI. 11. Altered mental status/delirium:   Seroquel prn for agitation/sundowning.   -monitor sleep-wake pattern   -Appears to have resolved 12. Hypertensive heart  disease: On ASA/Plavix/Zocor 13. Diarrhea , resolved 14. Urinary frequency and burning, Negative UA LOS (Days) 7 A FACE TO FACE EVALUATION WAS PERFORMED  Alysia Penna E 05/02/2016 10:26 AM

## 2016-05-02 NOTE — Progress Notes (Signed)
Social Work  Discharge Note  The overall goal for the admission was met for:   Discharge location: Yes - home with wife as primary support.  Additional support from daughter and son-in-law  Length of Stay: Yes - 8 days  Discharge activity level: Yes - supervision  Home/community participation: Yes  Services provided included: MD, RD, PT, OT, SLP, RN, TR, Pharmacy and SW  Financial Services: Medicare and Private Insurance: [place]  Follow-up services arranged: Outpatient: PT, ST via [facility], DME: tub seat via [facility] and Patient/Family has no preference for HH/DME agencies  Comments (or additional information):  Patient/Family verbalized understanding of follow-up arrangements: Yes  Individual responsible for coordination of the follow-up plan: pt/wife  Confirmed correct DME delivered: [name] 05/03/2016    Latrisha Coiro, Gardiner Rhyme

## 2016-05-02 NOTE — Progress Notes (Signed)
Occupational Therapy Discharge Summary  Patient Details  Name: Larry Ortega MRN: [id number] Date of Birth: May 24, 1924   Patient has met 9 of 10 long term goals due to improved activity tolerance, improved balance, postural control, ability to compensate for deficits, improved attention, improved awareness and improved coordination.  Patient to discharge at overall Supervision level.  Patient's care partner is independent to provide the necessary physical and cognitive assistance at discharge.    Reasons goals not met: Pt had a goal of memory with Supervision for day to day tasks. He continues to need A to recall basic safety with mobility and day to day tasks.  Recommendation:  No further OT services recommended at this time.  Equipment: tub seat  Reasons for discharge: treatment goals met  Patient/family agrees with progress made and goals achieved: Yes  OT Discharge ADL ADL ADL Comments: Supervision with self care and ADL transfers using RWSupervision/ set up with BADLs Vision/Perception  Vision- History Baseline Vision/History: Wears glasses Wears Glasses: Reading only Patient Visual Report: No change from baseline Vision- Assessment Vision Assessment?: No apparent visual deficits Perception Comments: WFL  Cognition Overall Cognitive Status: Impaired/Different from baseline Arousal/Alertness: Awake/alert Orientation Level: Oriented X4 Attention: Selective Selective Attention: Impaired Selective Attention Impairment: Verbal basic Memory: Impaired Memory Impairment: Retrieval deficit;Decreased recall of new information Decreased Short Term Memory: Verbal basic;Functional basic Awareness: Impaired Awareness Impairment: Emergent impairment Problem Solving: Impaired Problem Solving Impairment: Verbal complex Executive Function:  (all limited by underlying deficit) Behaviors: Impulsive Safety/Judgment: Impaired Sensation Sensation Stereognosis: Appears  Intact Hot/Cold: Appears Intact Proprioception: Appears Intact Coordination Fine Motor Movements are Fluid and Coordinated: Yes Motor  Motor Motor - Discharge Observations: mild LLE ataxia during ambulation, impaired balance strategies but improved from evaluation Mobility    close S with RW during ADL transfers Trunk/Postural Assessment  Cervical Assessment Cervical Assessment: Within Functional Limits Thoracic Assessment Thoracic Assessment: Within Functional Limits Lumbar Assessment Lumbar Assessment: Within Functional Limits Postural Control Protective Responses: impaired, improved from eval  Balance Static Sitting Balance Static Sitting - Level of Assistance: 7: Independent Dynamic Sitting Balance Dynamic Sitting - Level of Assistance: 7: Independent Static Standing Balance Static Standing - Level of Assistance: 5: Stand by assistance Dynamic Standing Balance Dynamic Standing - Level of Assistance: 5: Stand by assistance Extremity/Trunk Assessment RUE Assessment RUE Assessment: Within Functional Limits LUE Assessment LUE Assessment: Within Functional Limits   See Function Navigator for Current Functional Status.  [place] 05/02/2016, 12:21 PM

## 2016-05-02 NOTE — Progress Notes (Signed)
Occupational Therapy Session Note  Patient Details  Name: Larry Ortega MRN: [id number] Date of Birth: April 14, 1924  Today's Date: 05/02/2016 OT Individual Time: [id number] OT Individual Time Calculation (min): 75 min     Short Term Goals:Week 1:  OT Short Term Goal 1 (Week 1): Pt will complete grooming activity standing at sink with min A  OT Short Term Goal 2 (Week 1): Pt will complete shower transfer with min A  OT Short Term Goal 3 (Week 1): Pt will pull up pants completing sit to stand with min A   Skilled Therapeutic Interventions/Progress Updates:    Pt seen for skilled OT to facilitate dynamic balance, safety awareness, memory skills, and activity tolerance. Pt stood at sink to shave, brush teeth, etc. Ambulated with RW to shower with S. S with bathing and then set up with dressing in room.  Pt ambulated with RW to ADL apt kitchen. Pt practiced reaching into cupboards, freezer, fridge. Reviewed and practiced safe techniques for balance.  Pt will need S with basic meal prep.  He then ambulated to gym to sit on mat and work on UE strengthening with 4# dowel bar with theraband attachment for shoulder presses and retraction.  Returned to room with quick release belt on in recliner.  Post session, wife arrived. Informed her that pt continues to need cues to sit when doffing pants/socks over feet and to reach back/push up from sitting surface versus pulling on RW.    Therapy Documentation Precautions:  Precautions Precautions: Fall Restrictions Weight Bearing Restrictions: No    Vital Signs: Therapy Vitals Temp: 98.1 F (36.7 C) Temp Source: Oral Pulse Rate: (!) 57 Resp: 16 BP: 128/60 Patient Position (if appropriate): Lying Oxygen Therapy SpO2: 97 % O2 Device: Not Delivered Pain: Pain Assessment Pain Assessment: No/denies pain ADL:    See Function Navigator for Current Functional Status.   Therapy/Group: Individual Therapy  [place] 05/02/2016, 8:51 AM

## 2016-05-02 NOTE — Discharge Summary (Signed)
Physician Discharge Summary  Patient ID: Larry Ortega MRN: [id number] DOB/AGE: May 29, 1924 82 y.o.  Admit date: 04/25/2016 Discharge date: 05/03/2016  Discharge Diagnoses:  Principal Problem:   Encephalopathy Active Problems:   Aphasia   Essential hypertension   Seizures (Larry Ortega)   Hypokalemia   Prerenal azotemia   Discharged Condition: stable  Significant Diagnostic Studies: Ct Head Wo Contrast  Result Date: 04/25/2016 CLINICAL DATA:  Larry Ortega, hitting head on floor. EXAM: CT HEAD WITHOUT CONTRAST TECHNIQUE: Contiguous axial images were obtained from the base of the skull through the vertex without intravenous contrast. COMPARISON:  MRI of the head April 20, 2016 FINDINGS: INTRACRANIAL CONTENTS: The ventricles and sulci are normal for age. No intraparenchymal hemorrhage, mass effect nor midline shift. Patchy supratentorial white matter hypodensities are within normal range for patient's age and though non-specific likely represent chronic small vessel ischemic disease. Old LEFT basal ganglia lacunar infarct. Small area RIGHT frontal encephalomalacia. No acute large vascular territory infarcts. No abnormal extra-axial fluid collections. Basal cisterns are patent. Severe calcific atherosclerosis of the carotid siphons. ORBITS: The included ocular globes and orbital contents are non-suspicious. Status post bilateral ocular lens implants. SINUSES: The mastoid aircells and included paranasal sinuses are well-aerated. SKULL/SOFT TISSUES: Small RIGHT parietal scalp hematoma, no subcutaneous gas or radiopaque foreign bodies. IMPRESSION: No acute intracranial process. Stable appearance of the head including old small RIGHT frontal lobe infarct. Electronically Signed   By: Larry Ortega M.D.   On: 04/25/2016 01:39    Labs:  Basic Metabolic Panel:  Recent Labs Lab 04/29/16 0602 04/30/16 0707  NA 137 139  K 3.6 3.4*  CL 106 106  CO2 23 25  GLUCOSE 129* 119*  BUN 27* 25*  CREATININE 1.30* 1.17   CALCIUM 9.3 8.8*    CBC: CBC Latest Ref Rng & Units 04/26/2016 04/24/2016 04/22/2016  WBC 4.0 - 10.5 K/uL 8.6 7.6 11.4(H)  Hemoglobin 13.0 - 17.0 g/dL 12.8(L) 12.4(L) 13.0  Hematocrit 39.0 - 52.0 % 38.2(L) 37.6(L) 39.4  Platelets 150 - 400 K/uL 239 220 218    CBG: No results for input(s): GLUCAP in the last 168 hours.  Brief HPI:    Larry Ortega a 82 y.o.malewith history of CKD, lumbar stenosis with spondylolisthesis,B-CAS who was admitted on 04/20/16 with confusion, dizziness and facial droop. CT head negative for acute changes. Neurology consulted and felt that patient with symptoms likely due to [name] but family deferred t-PA or CTA as patient without motor deficits. Patient developed seizure activity X 2 while in ED and was loaded with Keppra and intubated for airway protection. LP done due to concerns of meningitis and he was placed on IV antibiotics and acyclovir.   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  EEG with nonspecific diffuse cerebral dysfunction. He tolerated extubation without difficulty and on 08/01 and acyclovir discontinued as  IGG+ but hsv DNA negative. 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 He continues to have confusion at nights and repeat head CT 8/03 showed stable right frontal encephalomalacia with old left BC infarct and no acute changes.  PT evaluation done revealing confusion, significant balance deficits and decreased awareness of deficits. CIR recommended for follow up therapy.    Hospital Course: Larry Ortega was admitted to rehab 04/25/2016 for inpatient therapies to consist of PT, ST and OT at least three hours five days a week. Past admission physiatrist, therapy team and rehab RN have worked  together to provide customized collaborative inpatient rehab. Blood pressures have been monitored on bid basis and have been well  controlled. Metoprolol was decreased to 12.5 mg bid due to episodic bradycardia. Sleep wake disruption has resolved and he has been seizure free during his rehab stay. Follow up labs done revealing hypokalemia which has been supplemented intermittently. He was encouraged to push po fluids due to pre-renal azotemia.  He did report frequency at nights and UA/UCS was ordered for work up on 8/9. This was negative for infection. He has been continent of bowel and bladder.  LLE ataxia is improving with improvement in gait.  He has progressed to supervision level and will continue to receive follow up outpatient PT and ST at [facility].    Rehab course: During patient's stay in rehab weekly team conferences were held to monitor patient's progress, set goals and discuss barriers to discharge. At admission, patient required min to moderate assist with mobility and self care tasks. He exhibited deficits in executive functioning with poor recall. He has had improvement in activity tolerance, balance,coordination,  Attention to tasks as well as ability to compensate for deficits.  He is able to complete bathing and dressing tasks with supervision. He is ambulating 500' on even and uneven surfaces with supervision as well as cues for safe RW use. He continues to have balance impairment with BERG balance score at 37/56.  He is showing improvement in awareness of deficits and requires min verbal cues for recall, safety awareness and problem solving. Family education was done with wife who will provide supervision after discharge.     Disposition: 01-Home or Self Care  Diet: Regular.   Special Instructions: 1. No driving or use of motorized vehicles.  2. Needs 24 hours supervision.  3. Needs BMET rechecked in 1-2 weeks.    Discharge Instructions    Ambulatory referral to Neurology    Complete by:  As directed   An appointment is requested in approximately: 2 weeks.   Ambulatory referral to  Physical Medicine Rehab    Complete by:  As directed   Follow up in 1-2 weeks       Medication List    STOP taking these medications   levETIRAcetam 500 mg in sodium chloride 0.9 % 100 mL     TAKE these medications   acetaminophen 325 MG tablet Commonly known as:  TYLENOL Take 1-2 tablets (325-650 mg total) by mouth every 6 (six) hours as needed for mild pain.   aspirin 81 MG tablet Take 81 mg by mouth daily.   clopidogrel 75 MG tablet Commonly known as:  PLAVIX Take 75 mg by mouth daily.   feeding supplement (ENSURE ENLIVE) Liqd Take 237 mLs by mouth 2 (two) times daily between meals.   levETIRAcetam 500 MG tablet Commonly known as:  KEPPRA Take 1 tablet (500 mg total) by mouth 2 (two) times daily.   metoprolol tartrate 25 MG tablet Commonly known as:  LOPRESSOR Take 0.5 tablets (12.5 mg total) by mouth 2 (two) times daily. What changed:  how much to take   omeprazole 20 MG capsule Commonly known as:  PRILOSEC Take 20 mg by mouth daily.   simvastatin 20 MG tablet Commonly known as:  ZOCOR Take 1 tablet (20 mg total) by mouth daily at 6 PM. What changed:  when to take this      Follow-up Information    Meredith Staggers, MD .   Specialty:  Physical Medicine and Rehabilitation Why:  office will call you with follow up appointment Contact information: [place] [address] [phone]        Velna Hatchet, MD. Call today.   Specialty:  Internal Medicine Why:  for post hospital follow up and for recheck of lytes in 1-2 weeks.  Contact information: [place] [address] [phone]        Melvenia Beam, MD Follow up on 05/17/2016.   Specialty:  Neurology Why:  [id number] at 9:30 for 10 am appointment--follow up seizure Contact information: [address] [place] [address] [phone]           Signed: Bary Leriche 05/03/2016, 4:23 PM

## 2016-05-03 LAB — URINE CULTURE: CULTURE: NO GROWTH

## 2016-05-03 NOTE — Discharge Instructions (Signed)
Inpatient Rehab Discharge Instructions  Larry Ortega Discharge date and time:  05/03/16  Activities/Precautions/ Functional Status: Activity: no lifting, driving, or strenuous exercise for till cleared by MD.  Diet: regular diet Drink at least 5 glasses of water/fluids daily. Wound Care: none needed   Functional status:  ___ No restrictions     ___ Walk up steps independently _X__ 24/7 supervision/assistance   ___ Walk up steps with assistance ___ Intermittent supervision/assistance  ___ Bathe/dress independently ___ Walk with walker     _X__ Bathe/dress with assistance ___ Walk Independently    ___ Shower independently ___ Walk with assistance    ___ Shower with assistance _X__ No alcohol     ___ Return to work/school ________    COMMUNITY REFERRALS UPON DISCHARGE:    Outpatient: PT       ST                              Agency:  [facility] Phone: [phone]  [phone] Dr.  Angelina Sheriff, Juliann Pulse)              Appointment Date/Time:   05/06/16 @ 10:00 am (for both physical and speech therapy)                                                                                                                                Located at [facility]                                                                                     [address]                                                                                     [place] [address]                                                                                     (  immediately upon entering the building turn left for the therapy office)   Medical Equipment/Items Ordered: tub seat                                                     Agency/Supplier:  [facility] @ [phone]                                                                   Special Instructions: 1. NO driving any motorized vehicles for at least a year. 2. Avoid swimming, tub baths, high altitudes, hot tubs or  diving.    FOR SAFETY, 24 HOUR SUPERVISION IS RECOMMENDED UNTIL OTHERWISE DIRECTED BY A PHYSICIAN.  ABSOLUTELY NO DRIVING UNTIL OTHERWISE DIRECTED BY A PHYSICIAN. CAR KEYS SHOULD BE HIDDEN IN A SAFE LOCATION IF NEEDED.  DUE TO RISK OF INJURY, PLEASE REMOVE GUNS, KNIVES, OR ANY OTHER POTENTIALLY DANGEROUS OBJECTS AND HAZARDOUS MATERIALS FROM THE HOME.   My questions have been answered and I understand these instructions. I will adhere to these goals and the provided educational materials after my discharge from the hospital.  Patient/Caregiver Signature _______________________________ Date __________  Clinician Signature _______________________________________ Date __________  Please bring this form and your medication list with you to all your follow-up doctor's appointments.

## 2016-05-03 NOTE — Progress Notes (Signed)
Pt given discharge instructions via Reesa Chew [facility]. Pt prepared for discharge and sent home with wife.

## 2016-05-03 NOTE — Progress Notes (Signed)
[facility] PHYSICAL MEDICINE & REHABILITATION     PROGRESS NOTE    Subjective/Complaints: Patient feels ready to go home today, wife at bedside   ROS: Denies CP, SOB, N/V/D.  Objective: Vital Signs: Blood pressure 128/67, pulse 60, temperature 97.8 F (36.6 C), temperature source Oral, resp. rate 16, height 5\' 11"  (1.803 m), weight 69.3 kg (152 lb 11.2 oz), SpO2 98 %. No results found. No results for input(s): WBC, HGB, HCT, PLT in the last 72 hours. No results for input(s): NA, K, CL, GLUCOSE, BUN, CREATININE, CALCIUM in the last 72 hours.  Invalid input(s): CO CBG (last 3)  No results for input(s): GLUCAP in the last 72 hours.  Wt Readings from Last 3 Encounters:  05/03/16 69.3 kg (152 lb 11.2 oz)  04/25/16 66.3 kg (146 lb 2.6 oz)  04/02/16 72.6 kg (160 lb)    Physical Exam:  Constitutional: NAD. Vital signs reviewed.  HENT: Normocephalic and atraumatic.  Eyes: EOM and Conj are normal.  Cardiovascular: Normal rate and regular rhythm.  No murmur heard. Respiratory: Effort normal and breath sounds normal. No stridor. No respiratory distress. He has no wheezes.  GI: Soft. Bowel sounds are normal. He exhibits no distension. There is no tenderness.  Musculoskeletal: He exhibits no edema or tenderness.  Neurological: He is alert and oriented x3.  Motor: 4+/5 throughout.   Skin: Skin is warm and dry. No erythema.  Psychiatric: His mood and affect appear normal.  In good spirits  Assessment/Plan: 1. Cognitive, mobility, and functional deficits secondary to encephalopathy  Stable for D/C today F/u PCP in 3-4 weeks F/u PM&R 2 weeks See D/C summary See D/C instructions Function:  Bathing Bathing position   Position: Shower  Bathing parts Body parts bathed by patient: Right arm, Back, Left arm, Chest, Abdomen, Front perineal area, Buttocks, Right upper leg, Left upper leg, Right lower leg, Left lower leg    Bathing assist Assist Level: Supervision or verbal cues       Upper Body Dressing/Undressing Upper body dressing   What is the patient wearing?: Pull over shirt/dress     Pull over shirt/dress - Perfomed by patient: Thread/unthread right sleeve, Thread/unthread left sleeve, Put head through opening, Pull shirt over trunk          Upper body assist Assist Level: No help, No cues      Lower Body Dressing/Undressing Lower body dressing   What is the patient wearing?: Underwear, Pants, Socks, Shoes Underwear - Performed by patient: Thread/unthread right underwear leg, Thread/unthread left underwear leg, Pull underwear up/down   Pants- Performed by patient: Thread/unthread right pants leg, Thread/unthread left pants leg, Pull pants up/down, Fasten/unfasten pants       Socks - Performed by patient: Don/doff right sock, Don/doff left sock   Shoes - Performed by patient: Don/doff right shoe, Don/doff left shoe, Fasten right, Fasten left            Lower body assist Assist for lower body dressing: Supervision or verbal cues      Toileting Toileting   Toileting steps completed by patient: Adjust clothing prior to toileting, Performs perineal hygiene, Adjust clothing after toileting Toileting steps completed by helper: Adjust clothing after toileting Toileting Assistive Devices: Grab bar or rail  Toileting assist Assist level: Supervision or verbal cues   Transfers Chair/bed transfer Chair/bed transfer activity did not occur: N/A Chair/bed transfer method: Ambulatory Chair/bed transfer assist level: Supervision or verbal cues Chair/bed transfer assistive device: Environmental consultant, Armrests  Locomotion Ambulation     Max distance: 500 ft Assist level: Supervision or verbal cues   Wheelchair          Cognition Comprehension Comprehension assist level: Understands basic 75 - 89% of the time/ requires cueing 10 - 24% of the time  Expression Expression assist level: Expresses basic 75 - 89% of the time/requires cueing 10 - 24% of the time.  Needs helper to occlude trach/needs to repeat words.  Social Interaction Social Interaction assist level: Interacts appropriately 75 - 89% of the time - Needs redirection for appropriate language or to initiate interaction.  Problem Solving Problem solving assist level: Solves basic 75 - 89% of the time/requires cueing 10 - 24% of the time  Memory Memory assist level: Recognizes or recalls 75 - 89% of the time/requires cueing 10 - 24% of the time   Medical Problem List and Plan: 1.  Cognitive, mobility and functional deficits secondary to encephalopathy and seizures  -Cont CIR therapies, PT, OT, speech,  discharge today 2.  DVT Prophylaxis/Anticoagulation:   Lovenox 3. Pain Management: Tylenol prn. Denies pain at this point 4. Mood: LCSW to follow for evaluation and support.  5. Neuropsych: This patient is not capable of making decisions on his own behalf.  -normalize sleep wake cycle 6. Skin/Wound Care: Routine pressure relief measures. Maintain adequate nutrition and hydration status.  7. Fluids/Electrolytes/Nutrition:   -mild hypokalemia---repleted  -pre-renal azotemia---cont to encourage fluids    8. New onset seizures: On keppra bid. Discussed seizure medication with son 49. HTN: Monitor BID. Continue metoprolol, reduced dosage given bradycardia and low systolic, resolving   Vitals:   05/02/16 1942 05/03/16 0614  BP: 134/70 128/67  Pulse: 63 60  Resp:  16  Temp:  97.8 F (36.6 C)   10 GERD: Resumed PPI. 11. Altered mental status/delirium:   Seroquel prn for agitation/sundowning.   -monitor sleep-wake pattern   -Appears to have resolved 12. Hypertensive heart disease: On ASA/Plavix/Zocor 13. Diarrhea , resolved 14. Urinary frequency and burning, Negative UA LOS (Days) 8 A FACE TO FACE EVALUATION WAS PERFORMED  Jerome Viglione E 05/03/2016 9:12 AM

## 2016-05-06 ENCOUNTER — Telehealth: Payer: Self-pay | Admitting: *Deleted

## 2016-05-06 DIAGNOSIS — R270 Ataxia, unspecified: Secondary | ICD-10-CM | POA: Diagnosis not present

## 2016-05-06 DIAGNOSIS — R488 Other symbolic dysfunctions: Secondary | ICD-10-CM | POA: Diagnosis not present

## 2016-05-06 NOTE — Telephone Encounter (Addendum)
Transitional care call: 1st attempt to reach patient 05/06/16 10:44 am--left message to call office re follow up appt with Dr Swartz/Patel Wife Velma called back  11:47 am    1. Are you/is patient experiencing any problems since coming home? Are there any questions regarding any aspect of care? No 2. Are there any questions regarding medications administration/dosing? Are meds being taken as prescribed? Patient should review meds with caller to confirm Yes 3. Have there been any falls? No 4. Has Home Health been to the house and/or have they contacted you? If not, have you tried to contact them? Can we help you contact them? NO he is going to outpt therapy 5. Are bowels and bladder emptying properly? Are there any unexpected incontinence issues? If applicable, is patient following bowel/bladder programs? No 6. Any fevers, problems with breathing, unexpected pain? No 7. Are there any skin problems or new areas of breakdown? No 8. Has the patient/family member arranged specialty MD follow up (ie cardiology/neurology/renal/surgical/etc)?  Can we help arrange?Yes 9. Does the patient need any other services or support that we can help arrange?No 10. Are caregivers following through as expected in assisting the patient?Yes Has the patient quit smoking, drinking alcohol, or using drugs as recommended? N/A  Appt with Dr Posey Pronto 05/16/16 @ 2:00  Arrive by 1:30 --will follow up with Naaman Plummer thereafter Address reviewed and they are familiar with building. They have 4:00 appt with Dr Ardeth Perfect

## 2016-05-08 DIAGNOSIS — R488 Other symbolic dysfunctions: Secondary | ICD-10-CM | POA: Diagnosis not present

## 2016-05-08 DIAGNOSIS — R270 Ataxia, unspecified: Secondary | ICD-10-CM | POA: Diagnosis not present

## 2016-05-13 DIAGNOSIS — R270 Ataxia, unspecified: Secondary | ICD-10-CM | POA: Diagnosis not present

## 2016-05-13 DIAGNOSIS — R488 Other symbolic dysfunctions: Secondary | ICD-10-CM | POA: Diagnosis not present

## 2016-05-15 DIAGNOSIS — R270 Ataxia, unspecified: Secondary | ICD-10-CM | POA: Diagnosis not present

## 2016-05-15 DIAGNOSIS — R488 Other symbolic dysfunctions: Secondary | ICD-10-CM | POA: Diagnosis not present

## 2016-05-16 ENCOUNTER — Encounter: Payer: Medicare Other | Attending: Physical Medicine & Rehabilitation | Admitting: Physical Medicine & Rehabilitation

## 2016-05-16 ENCOUNTER — Encounter: Payer: Self-pay | Admitting: Physical Medicine & Rehabilitation

## 2016-05-16 VITALS — BP 178/81 | HR 47 | Resp 14

## 2016-05-16 DIAGNOSIS — R899 Unspecified abnormal finding in specimens from other organs, systems and tissues: Secondary | ICD-10-CM | POA: Insufficient documentation

## 2016-05-16 DIAGNOSIS — C61 Malignant neoplasm of prostate: Secondary | ICD-10-CM | POA: Diagnosis not present

## 2016-05-16 DIAGNOSIS — I129 Hypertensive chronic kidney disease with stage 1 through stage 4 chronic kidney disease, or unspecified chronic kidney disease: Secondary | ICD-10-CM | POA: Diagnosis not present

## 2016-05-16 DIAGNOSIS — R001 Bradycardia, unspecified: Secondary | ICD-10-CM | POA: Insufficient documentation

## 2016-05-16 DIAGNOSIS — N189 Chronic kidney disease, unspecified: Secondary | ICD-10-CM | POA: Diagnosis not present

## 2016-05-16 DIAGNOSIS — G9340 Encephalopathy, unspecified: Secondary | ICD-10-CM | POA: Insufficient documentation

## 2016-05-16 DIAGNOSIS — R569 Unspecified convulsions: Secondary | ICD-10-CM | POA: Diagnosis not present

## 2016-05-16 DIAGNOSIS — Z5189 Encounter for other specified aftercare: Secondary | ICD-10-CM | POA: Insufficient documentation

## 2016-05-16 DIAGNOSIS — E876 Hypokalemia: Secondary | ICD-10-CM | POA: Diagnosis not present

## 2016-05-16 DIAGNOSIS — R5383 Other fatigue: Secondary | ICD-10-CM | POA: Diagnosis not present

## 2016-05-16 DIAGNOSIS — I6523 Occlusion and stenosis of bilateral carotid arteries: Secondary | ICD-10-CM | POA: Diagnosis not present

## 2016-05-16 DIAGNOSIS — I10 Essential (primary) hypertension: Secondary | ICD-10-CM | POA: Insufficient documentation

## 2016-05-16 NOTE — Progress Notes (Signed)
Subjective:    Patient ID: Larry Ortega, male    DOB: November 22, 1923, 82 y.o.   MRN: [id number]  HPI 82 y.o. male with history of lumbar stenosis with spondylolisthesis,B-CAS who presents for transitional care management after being discharged from [facility] for seizures.  Admit date: 04/25/2016 Discharge date: 05/03/2016 At discharge, pt was instructed not to drive, which he has been compliant with.  He has 24 hours supervision. He has not has had BMET drawn.  He has an appointment to see PCP today. He has an appointment to see Neurology as well.  Since discharge pt and wife deny delirium or cognitive difficulties. Overall, he states he has been doing well since discharge, just wants to know why this happened to him.    DME: Shower chair Therapies: 2/week Mobility: Walker occasionally  Pain Inventory Average Pain 0 Pain Right Now 0 My pain is no pain  In the last 24 hours, has pain interfered with the following? General activity 0 Relation with others 0 Enjoyment of life 0 What TIME of day is your pain at its worst? no pain Sleep (in general) NA  Pain is worse with: no pain Pain improves with: no pain Relief from Meds: no pain  Mobility walk without assistance  Function retired  Neuro/Psych No problems in this area  Prior Studies Any changes since last visit?  no  Physicians involved in your care Any changes since last visit?  no   No family history on file. Social History   Social History  . Marital status: Married    Spouse name: N/A  . Number of children: N/A  . Years of education: N/A   Social History Main Topics  . Smoking status: Never Smoker  . Smokeless tobacco: Never Used  . Alcohol use None  . Drug use: Unknown  . Sexual activity: Not Asked   Other Topics Concern  . None   Social History Narrative  . None   Past Surgical History:  Procedure Laterality Date  . BOWEL RESECTION    . CHOLECYSTECTOMY    . eyelid surgery     Past Medical History:    Diagnosis Date  . Bilateral carotid artery occlusion   . Cancer [facility])    prostate  . Chronic kidney disease   . Hypertensive heart disease without congestive heart failure   . Lumbar spinal stenosis   . Lumbar spondylosis    BP (!) 178/81 (BP Location: Left Arm, Patient Position: Sitting, Cuff Size: Normal)   Pulse (!) 47   Resp 14   SpO2 96%   Opioid Risk Score:   Fall Risk Score:  `1  Depression screen PHQ 2/9  Depression screen PHQ 2/9 05/16/2016  Decreased Interest 0  Down, Depressed, Hopeless 0  PHQ - 2 Score 0    Review of Systems  Constitutional: Positive for unexpected weight change.  All other systems reviewed and are negative.      Objective:   Physical Exam Constitutional: NAD. Vital signs reviewed.  HENT: Normocephalic and atraumatic.  Eyes: EOM and Conj are normal. Right eye ptosis.  Cardiovascular: Normal rate and regular rhythm.  No murmur heard. Respiratory: Effort normal and breath sounds normal. No stridor. No respiratory distress. He has no wheezes.  GI: Soft. Bowel sounds are normal. He exhibits no distension. There is no tenderness.  Musculoskeletal: He exhibits no edema or tenderness.  Neurological: He is alert and oriented x3, with some confusion and mild difficulty with concentration/attention.  Motor: 5/5 throughout.  Skin: Skin is warm and dry. No erythema.  Psychiatric: His mood and affect appear normal.  In good spirits    Assessment & Plan:  82 y.o. male with history of lumbar stenosis with spondylolisthesis,B-CAS who presents for transitional care management after being discharged from [facility] for seizures.   1. Encephalopathy  Encephalopathy appears to have resolved per pt and wife, may have some very mild persistent symptoms  Cont therapies  2. Seizures  Cont keppra bid  Cont follow up with Neurology  3. HTN  Elevated today with SBP 178  Pt has appointment with PCP today, recommend adjustment  4. Bradycardia  HR 47, issues in  hospital as well  Pt has appointment with PCP today, recommend adjustment to metoprolol  5. Abnormal BMET  Will order BMP

## 2016-05-17 ENCOUNTER — Ambulatory Visit (INDEPENDENT_AMBULATORY_CARE_PROVIDER_SITE_OTHER): Payer: Medicare Other | Admitting: Neurology

## 2016-05-17 ENCOUNTER — Encounter: Payer: Self-pay | Admitting: Neurology

## 2016-05-17 VITALS — BP 121/59 | HR 50 | Ht 71.0 in | Wt 162.0 lb

## 2016-05-17 DIAGNOSIS — I639 Cerebral infarction, unspecified: Secondary | ICD-10-CM | POA: Diagnosis not present

## 2016-05-17 DIAGNOSIS — G40001 Localization-related (focal) (partial) idiopathic epilepsy and epileptic syndromes with seizures of localized onset, not intractable, with status epilepticus: Secondary | ICD-10-CM

## 2016-05-17 MED ORDER — LEVETIRACETAM 500 MG PO TABS: 500.0000 mg | ORAL_TABLET | Freq: Two times a day (BID) | ORAL | 12 refills | Status: DC

## 2016-05-17 NOTE — Patient Instructions (Signed)
Remember to drink plenty of fluid, eat healthy meals and do not skip any meals. Try to eat protein with a every meal and eat a healthy snack such as fruit or nuts in between meals. Try to keep a regular sleep-wake schedule and try to exercise daily, particularly in the form of walking, 20-30 minutes a day, if you can.   As far as your medications are concerned, I would like to suggest: Continue current medications  I would like to see you back in 3 months, sooner if we need to. Please call us with any interim questions, concerns, problems, updates or refill requests.   Our phone number is [phone]. We also have an after hours call service for urgent matters and there is a physician on-call for urgent questions. For any emergencies you know to call 911 or go to the nearest emergency room

## 2016-05-17 NOTE — Progress Notes (Signed)
[facility]    Provider:  Dr Jaynee Eagles Referring Provider: Velna Hatchet, MD Primary Care Physician:  Velna Hatchet, MD  CC:  Seizures  HPI:  Larry Ortega is a 82 y.o. male here as a referral from Dr. Ardeth Perfect for seizure. Past medical history of chronic kidney disease, lumbar stenosis. Patient was hospitalized at [facility] after onset of altered mentation and a witnessed seizure in the emergency room. He was discharged on Keppra 500 mg twice a day. Since he left the hospital no further episodes. Here with his wife of over 53 years who also provides information regarding the episodes. No personal or family history of seizures. He has improved. He feels great. He feels some weakness in the legs since hospitalization and he is in physical therapy. No further episodes of altered mentation or seizures. No family history or personal history of seizures. Previous to the episode, He was under some stress, a friend was sick and dying at the time. The friend died and they were executors and dealing with the state and they were working in her house and getting things in order. Wife says he had an episode of staring and unresponsiveness while in their friend's apartment, they called 911, he doesn;t remember much of it. He remembers possibly getting in the ambulance, He didn't want to get into the ambulance. He just didn;t answer, he was sitting on the porch and not answering, no facial droop, he got into the car and holding on the steering wheel and didn;t move. No facial droop or other focal weakness and per wife he looked alright except for the altered awareness. He yelled and had a convulsive seizure which was witnessed in the emergency room. Since being discharged no altered awareness or any other issues. He feels his short-term memory is impaired since leaving the hospital, attributes it to possibly Keppra.   Reviewed notes, labs and imaging from outside physicians, which  showed:  Personally reviewed MRI images and agree with the following: 1. No acute intracranial abnormality identified. 2. Moderate chronic small vessel ischemic disease. 3. 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 4. Motion degraded head MRA without evidence of large vessel occlusion or flow limiting proximal stenosis  Most recent BMP showed normal sodium, potassium 3.4, normal chloride and CO2, elevated glucose 119, BUN 25, creatinine 1.17, GFR mildly decreased at 53 mL/min. CBC was unremarkable except mild anemia hemoglobin 12.8. LP showed elevated HSV antibody IgG but negative PCR. Protein was slightly elevated at 66, white blood cells were normal. Serum TSH was normal, serum vitamin B12 341 and HIV and RPR were negative.  Patient admitted 04/20/2016 with confusion, dizziness and reported facial droop. CT of the head was negative for acute changes. Initially thought to have left MCA infarct did not receive TPA. Patient developed seizure activity 2 on the ED and was loaded with Keppra and intubated for airway protection. LP was unremarkable. MRI showed chronic right frontal and right cerebellar infarcts with no acute abnormality. EEG with nonspecific diffuse cerebral dysfunction but no focal epileptiform activity. 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 Patient was discharged to rehabilitation.  Review of Systems: Patient complains of symptoms per HPI as well as the following symptoms: no CP or SOB. Pertinent negatives per HPI. All others negative.   Social History   Social History  . Marital status: Married    Spouse name: N/A  . Number of children: N/A  . Years of education: N/A   Occupational History  . retired  Social History Main Topics  . Smoking status: Never Smoker  . Smokeless tobacco: Never Used  . Alcohol use Not on file  . Drug use: Unknown  . Sexual activity: Not on file   Other Topics Concern  . Not on file   Social History Narrative    No alcohol or drug use    History reviewed. No pertinent family history.  Past Medical History:  Diagnosis Date  . Bilateral carotid artery occlusion   . Cancer [facility])    prostate  . Chronic kidney disease   . Hypertensive heart disease without congestive heart failure   . Lumbar spinal stenosis   . Lumbar spondylosis     Past Surgical History:  Procedure Laterality Date  . BOWEL RESECTION    . CHOLECYSTECTOMY    . eyelid surgery      Current Outpatient Prescriptions  Medication Sig Dispense Refill  . acetaminophen (TYLENOL) 325 MG tablet Take 1-2 tablets (325-650 mg total) by mouth every 6 (six) hours as needed for mild pain.    Marland Kitchen aspirin 81 MG tablet Take 81 mg by mouth daily.    . clopidogrel (PLAVIX) 75 MG tablet Take 75 mg by mouth daily.    Marland Kitchen levETIRAcetam (KEPPRA) 500 MG tablet Take 1 tablet (500 mg total) by mouth 2 (two) times daily. 60 tablet 12  . metoprolol tartrate (LOPRESSOR) 25 MG tablet Take 0.5 tablets (12.5 mg total) by mouth 2 (two) times daily.    . simvastatin (ZOCOR) 20 MG tablet Take 1 tablet (20 mg total) by mouth daily at 6 PM. 30 tablet    No current facility-administered medications for this visit.     Allergies as of 05/17/2016  . (No Known Allergies)    Vitals: BP (!) 121/59   Pulse (!) 50   Ht 5\' 11"  (1.803 m)   Wt 162 lb (73.5 kg)   BMI 22.59 kg/m  Last Weight:  Wt Readings from Last 1 Encounters:  05/17/16 162 lb (73.5 kg)   Last Height:   Ht Readings from Last 1 Encounters:  05/17/16 5\' 11"  (1.803 m)   Physical exam: Exam: Gen: NAD, conversant, well nourised,  well groomed                     CV: RRR, no MRG. No Carotid Bruits. No peripheral edema, warm, nontender Eyes: Conjunctivae clear without exudates or hemorrhage  Neuro: Detailed Neurologic Exam  Speech:    Speech is normal; fluent and spontaneous with normal comprehension.  Cognition:    The patient is oriented to person, place, and time;     recent and remote  memory intact;     language fluent;     normal attention, concentration,     fund of knowledge Cranial Nerves:    The pupils are equal, round, and reactive to light. The fundi are normal and spontaneous venous pulsations are present. Visual fields are full to finger confrontation. Extraocular movements are intact. Trigeminal sensation is intact and the muscles of mastication are normal. Right eye ptosis (chronic) The face is symmetric. The palate elevates in the midline. Hearing intact. Voice is normal. Shoulder shrug is normal. The tongue has normal motion without fasciculations.   Coordination:    No dysmetria  Gait:    Heel-toe and tandem gait are normal. ** mildly stooped, good arm swing, not shufflin, mildly wide based. difficuty   Motor Observation:    No asymmetry, no atrophy, and no involuntary movements noted.  Tone:    Normal muscle tone.    Posture:    Posture is stooped    Strength:    Strength is V/V in the upper and lower limbs.      Sensation: intact to LT     Reflex Exam:  DTR's: Absent AJs otherwise brisk    Deep tendon reflexes in the upper and lower extremities are normal bilaterally.   Toes:    The toes are equivocal bilaterally.   Clonus:    Clonus is absent.       Assessment/Plan:  A 82 year old male with cerebrovascular disease and prior strokes with new onset seizures.  History suggests partial seizures with temporal lobe onset followed by secondary generalization. Agree that most likely etiology in this case would be underlying cerebrovascular disease. Continue Keppra. Continue seizure precautions.  Patient is unable to drive, operate heavy machinery, perform activities at heights or participate in water activities until 1 year (they live in [place]) seizure free  Discussed Patients with epilepsy have a small risk of sudden unexpected death, a condition referred to as sudden unexpected death in epilepsy (SUDEP). SUDEP is defined specifically as the  sudden, unexpected, witnessed or unwitnessed, nontraumatic and nondrowning death in patients with epilepsy with or without evidence for a seizure, and excluding documented status epilepticus, in which post mortem examination does not reveal a structural or toxicologic cause for death   I had a long d/w patient about old strokes, risk for recurrent stroke/TIAs, personally independently reviewed imaging studies and stroke evaluation results and answered questions. Likely due to small vessel disease. Continue Plavix and ASA and statin for secondary stroke prevention and maintain strict control of hypertension with blood pressure goal below 130/90, diabetes with hemoglobin A1c goal below 6.5% and lipids with LDL cholesterol goal below 70 mg/dL. I also advised the patient to eat a healthy diet with plenty of whole grains, cereals, fruits and vegetables, exercise regularly and maintain ideal body weight .  For any repeat episodes of alteration of consciousness proceed to the emergency room if any more episodes and also inform our office. If that occurs we'll likely increase his Keppra to 750 or thousand milligrams twice a day.   Sarina Ill, MD  [facility] [address] [place], [place] [address]  Phone [phone] Fax [phone]

## 2016-05-19 ENCOUNTER — Encounter: Payer: Self-pay | Admitting: Neurology

## 2016-05-20 DIAGNOSIS — R270 Ataxia, unspecified: Secondary | ICD-10-CM | POA: Diagnosis not present

## 2016-05-20 DIAGNOSIS — R488 Other symbolic dysfunctions: Secondary | ICD-10-CM | POA: Diagnosis not present

## 2016-05-21 LAB — FUNGUS CULTURE WITH STAIN

## 2016-05-21 LAB — FUNGAL ORGANISM REFLEX

## 2016-05-21 LAB — FUNGUS CULTURE RESULT

## 2016-05-23 DIAGNOSIS — R488 Other symbolic dysfunctions: Secondary | ICD-10-CM | POA: Diagnosis not present

## 2016-05-23 DIAGNOSIS — R270 Ataxia, unspecified: Secondary | ICD-10-CM | POA: Diagnosis not present

## 2016-05-28 DIAGNOSIS — R488 Other symbolic dysfunctions: Secondary | ICD-10-CM | POA: Diagnosis not present

## 2016-05-29 DIAGNOSIS — R488 Other symbolic dysfunctions: Secondary | ICD-10-CM | POA: Diagnosis not present

## 2016-06-01 DIAGNOSIS — R5383 Other fatigue: Secondary | ICD-10-CM | POA: Diagnosis not present

## 2016-06-01 DIAGNOSIS — G9340 Encephalopathy, unspecified: Secondary | ICD-10-CM | POA: Diagnosis not present

## 2016-06-01 DIAGNOSIS — I672 Cerebral atherosclerosis: Secondary | ICD-10-CM | POA: Diagnosis not present

## 2016-06-01 DIAGNOSIS — R001 Bradycardia, unspecified: Secondary | ICD-10-CM | POA: Diagnosis not present

## 2016-06-01 DIAGNOSIS — Z6822 Body mass index (BMI) 22.0-22.9, adult: Secondary | ICD-10-CM | POA: Diagnosis not present

## 2016-06-01 DIAGNOSIS — E876 Hypokalemia: Secondary | ICD-10-CM | POA: Diagnosis not present

## 2016-06-01 DIAGNOSIS — I10 Essential (primary) hypertension: Secondary | ICD-10-CM | POA: Diagnosis not present

## 2016-06-01 DIAGNOSIS — R569 Unspecified convulsions: Secondary | ICD-10-CM | POA: Diagnosis not present

## 2016-06-03 DIAGNOSIS — R488 Other symbolic dysfunctions: Secondary | ICD-10-CM | POA: Diagnosis not present

## 2016-06-04 LAB — ACID FAST CULTURE WITH REFLEXED SENSITIVITIES (MYCOBACTERIA)

## 2016-06-04 LAB — ACID FAST CULTURE WITH REFLEXED SENSITIVITIES: ACID FAST CULTURE - AFSCU3: NEGATIVE

## 2016-06-20 DIAGNOSIS — Z23 Encounter for immunization: Secondary | ICD-10-CM | POA: Diagnosis not present

## 2016-06-21 DIAGNOSIS — R488 Other symbolic dysfunctions: Secondary | ICD-10-CM | POA: Diagnosis not present

## 2016-06-24 DIAGNOSIS — R270 Ataxia, unspecified: Secondary | ICD-10-CM | POA: Diagnosis not present

## 2016-06-26 DIAGNOSIS — C61 Malignant neoplasm of prostate: Secondary | ICD-10-CM | POA: Diagnosis not present

## 2016-06-26 DIAGNOSIS — R351 Nocturia: Secondary | ICD-10-CM | POA: Diagnosis not present

## 2016-07-15 DIAGNOSIS — I10 Essential (primary) hypertension: Secondary | ICD-10-CM | POA: Diagnosis not present

## 2016-07-15 DIAGNOSIS — Z6822 Body mass index (BMI) 22.0-22.9, adult: Secondary | ICD-10-CM | POA: Diagnosis not present

## 2016-07-15 DIAGNOSIS — M6281 Muscle weakness (generalized): Secondary | ICD-10-CM | POA: Diagnosis not present

## 2016-07-15 DIAGNOSIS — R569 Unspecified convulsions: Secondary | ICD-10-CM | POA: Diagnosis not present

## 2016-08-13 ENCOUNTER — Encounter: Payer: Self-pay | Admitting: Physical Medicine & Rehabilitation

## 2016-08-13 ENCOUNTER — Encounter: Payer: Medicare Other | Attending: Physical Medicine & Rehabilitation | Admitting: Physical Medicine & Rehabilitation

## 2016-08-13 ENCOUNTER — Encounter: Payer: Self-pay | Admitting: Neurology

## 2016-08-13 ENCOUNTER — Ambulatory Visit (INDEPENDENT_AMBULATORY_CARE_PROVIDER_SITE_OTHER): Payer: Medicare Other | Admitting: Neurology

## 2016-08-13 VITALS — BP 143/75 | HR 75 | Resp 16

## 2016-08-13 VITALS — BP 110/60 | HR 55 | Ht 71.0 in | Wt 165.6 lb

## 2016-08-13 DIAGNOSIS — R569 Unspecified convulsions: Secondary | ICD-10-CM

## 2016-08-13 DIAGNOSIS — I639 Cerebral infarction, unspecified: Secondary | ICD-10-CM | POA: Diagnosis not present

## 2016-08-13 DIAGNOSIS — G9340 Encephalopathy, unspecified: Secondary | ICD-10-CM

## 2016-08-13 DIAGNOSIS — G40209 Localization-related (focal) (partial) symptomatic epilepsy and epileptic syndromes with complex partial seizures, not intractable, without status epilepticus: Secondary | ICD-10-CM

## 2016-08-13 DIAGNOSIS — G40201 Localization-related (focal) (partial) symptomatic epilepsy and epileptic syndromes with complex partial seizures, not intractable, with status epilepticus: Secondary | ICD-10-CM

## 2016-08-13 MED ORDER — LEVETIRACETAM 500 MG PO TABS: 500.0000 mg | ORAL_TABLET | Freq: Two times a day (BID) | ORAL | 5 refills | Status: DC

## 2016-08-13 NOTE — Patient Instructions (Addendum)
Remember to drink plenty of fluid, eat healthy meals and do not skip any meals. Try to eat protein with a every meal and eat a healthy snack such as fruit or nuts in between meals. Try to keep a regular sleep-wake schedule and try to exercise daily, particularly in the form of walking, 20-30 minutes a day, if you can.   As far as your medications are concerned, I would like to suggest: Continue keppra   I would like to see you back as needed, sooner if we need to. Please call us with any interim questions, concerns, problems, updates or refill requests.   Our phone number is [phone]. We also have an after hours call service for urgent matters and there is a physician on-call for urgent questions. For any emergencies you know to call 911 or go to the nearest emergency room

## 2016-08-13 NOTE — Progress Notes (Signed)
[facility]    Provider:  Dr Jaynee Eagles Referring Provider: Velna Hatchet, MD Primary Care Physician:  Velna Hatchet, MD  CC:  Seizures  Interval history 08/13/2016:   He is doing very well. No events, no side effects from the [place]. Discussed seizures. Discussed he should likely be on Keppra for life. Risks of seizures and reocurrence. Discussed seizure precautions. If he is doing well can follow up with his pcp who can prescribe Keppra. They do not wish to consider stopping the Keppra in the future, they prefer to just stay on it.   HPI:  Larry Ortega is a 82 y.o. male here as a referral from Dr. Ardeth Perfect for seizure. Past medical history of chronic kidney disease, lumbar stenosis. Patient was hospitalized at [facility] after onset of altered mentation and a witnessed seizure in the emergency room. He was discharged on Keppra 500 mg twice a day. Since he left the hospital no further episodes. Here with his wife of over 63 years who also provides information regarding the episodes. No personal or family history of seizures. He has improved. He feels great. He feels some weakness in the legs since hospitalization and he is in physical therapy. No further episodes of altered mentation or seizures. No family history or personal history of seizures. Previous to the episode, He was under some stress, a friend was sick and dying at the time. The friend died and they were executors and dealing with the state and they were working in her house and getting things in order. Wife says he had an episode of staring and unresponsiveness while in their friend's apartment, they called 911, he doesn;t remember much of it. He remembers possibly getting in the ambulance, He didn't want to get into the ambulance. He just didn;t answer, he was sitting on the porch and not answering, no facial droop, he got into the car and holding on the steering wheel and didn;t move. No facial droop or other focal  weakness and per wife he looked alright except for the altered awareness. He yelled and had a convulsive seizure which was witnessed in the emergency room. Since being discharged no altered awareness or any other issues. He feels his short-term memory is impaired since leaving the hospital, attributes it to possibly Keppra.   Reviewed notes, labs and imaging from outside physicians, which showed:  Personally reviewed MRI images and agree with the following: 1. No acute intracranial abnormality identified. 2. Moderate chronic small vessel ischemic disease. 3. 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 4. Motion degraded head MRA without evidence of large vessel occlusion or flow limiting proximal stenosis  Most recent BMP showed normal sodium, potassium 3.4, normal chloride and CO2, elevated glucose 119, BUN 25, creatinine 1.17, GFR mildly decreased at 53 mL/min. CBC was unremarkable except mild anemia hemoglobin 12.8. LP showed elevated HSV antibody IgG but negative PCR. Protein was slightly elevated at 66, white blood cells were normal. Serum TSH was normal, serum vitamin B12 341 and HIV and RPR were negative.  Patient admitted 04/20/2016 with confusion, dizziness and reported facial droop. CT of the head was negative for acute changes. Initially thought to have left MCA infarct did not receive TPA. Patient developed seizure activity 2 on the ED and was loaded with Keppra and intubated for airway protection. LP was unremarkable. MRI showed chronic right frontal and right cerebellar infarcts with no acute abnormality. EEG with nonspecific diffuse cerebral dysfunction but no focal epileptiform activity. 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 Patient was discharged to rehabilitation.  Review of Systems: Patient complains of symptoms per HPI as well as the following symptoms: no CP or SOB. Pertinent negatives per HPI. All others negative.   Social History   Social  History  . Marital status: Married    Spouse name: N/A  . Number of children: N/A  . Years of education: N/A   Occupational History  . retired    Social History Main Topics  . Smoking status: Never Smoker  . Smokeless tobacco: Never Used  . Alcohol use Not on file  . Drug use: Unknown  . Sexual activity: Not on file   Other Topics Concern  . Not on file   Social History Narrative   No alcohol or drug use    No family history on file.  Past Medical History:  Diagnosis Date  . Bilateral carotid artery occlusion   . Cancer [facility])    prostate  . Chronic kidney disease   . Hypertensive heart disease without congestive heart failure   . Lumbar spinal stenosis   . Lumbar spondylosis     Past Surgical History:  Procedure Laterality Date  . BOWEL RESECTION    . CHOLECYSTECTOMY    . eyelid surgery      Current Outpatient Prescriptions  Medication Sig Dispense Refill  . acetaminophen (TYLENOL) 325 MG tablet Take 1-2 tablets (325-650 mg total) by mouth every 6 (six) hours as needed for mild pain.    Marland Kitchen amLODipine (NORVASC) 5 MG tablet Take 1 tablet by mouth daily.    Marland Kitchen aspirin 81 MG tablet Take 81 mg by mouth daily.    . B Complex Vitamins (B COMPLEX PO) Take 1,000 mg by mouth daily.    Marland Kitchen CALCIUM PO Take 600 mg by mouth daily.    . Cholecalciferol (VITAMIN D3) 2000 units TABS Take by mouth daily.    . clopidogrel (PLAVIX) 75 MG tablet Take 75 mg by mouth daily.    Marland Kitchen levETIRAcetam (KEPPRA) 500 MG tablet Take 1 tablet (500 mg total) by mouth 2 (two) times daily. 60 tablet 12  . metoprolol tartrate (LOPRESSOR) 25 MG tablet Take 0.5 tablets (12.5 mg total) by mouth 2 (two) times daily.    . Omega-3 Fatty Acids (FISH OIL PO) Take 1,200 mg by mouth daily.    . simvastatin (ZOCOR) 20 MG tablet Take 1 tablet (20 mg total) by mouth daily at 6 PM. 30 tablet   . UNABLE TO FIND 1,000 mg daily. Cinnamon     No current facility-administered medications for this visit.     Allergies  as of 08/13/2016  . (No Known Allergies)    Vitals: BP 110/60   Pulse (!) 55   Ht 5\' 11"  (1.803 m)   Wt 165 lb 9.6 oz (75.1 kg)   BMI 23.10 kg/m  Last Weight:  Wt Readings from Last 1 Encounters:  08/13/16 165 lb 9.6 oz (75.1 kg)   Last Height:   Ht Readings from Last 1 Encounters:  08/13/16 5\' 11"  (1.803 m)    Physical exam: Exam: Gen: NAD, conversant, well nourised,  well groomed                     CV: RRR, no MRG. No Carotid Bruits. No peripheral edema, warm, nontender Eyes: Conjunctivae clear without exudates or hemorrhage  Neuro: Detailed Neurologic Exam  Speech:    Speech is normal; fluent and spontaneous with normal comprehension.  Cognition:  The patient is oriented to person, place, and time;     recent and remote memory intact;     language fluent;     normal attention, concentration,     fund of knowledge Cranial Nerves:    The pupils are equal, round, and reactive to light. The fundi are normal and spontaneous venous pulsations are present. Visual fields are full to finger confrontation. Extraocular movements are intact. Trigeminal sensation is intact and the muscles of mastication are normal. Right eye ptosis (chronic) The face is symmetric. The palate elevates in the midline. Hearing intact. Voice is normal. Shoulder shrug is normal. The tongue has normal motion without fasciculations.   Coordination:    No dysmetria  Gait:    Heel-toe and tandem gait are normal. ** mildly stooped, good arm swing, not shufflin, mildly wide based. difficuty   Motor Observation:    No asymmetry, no atrophy, and no involuntary movements noted. Tone:    Normal muscle tone.    Posture:    Posture is stooped    Strength:    Strength is V/V in the upper and lower limbs.      Sensation: intact to LT     Reflex Exam:  DTR's: Absent AJs otherwise brisk    Deep tendon reflexes in the upper and lower extremities are normal bilaterally.   Toes:    The toes  are equivocal bilaterally.   Clonus:    Clonus is absent.       Assessment/Plan:  A 82 year old male with cerebrovascular disease and prior strokes with new onset seizures.  History suggests partial seizures with temporal lobe onset followed by secondary generalization. Agree that most likely etiology in this case would be underlying cerebrovascular disease. Continue Keppra likely for life. Continue seizure precautions.  Patient is unable to drive, operate heavy machinery, perform activities at heights or participate in water activities until 1 year (they live in [place]) seizure free  Discussed Patients with epilepsy have a small risk of sudden unexpected death, a condition referred to as sudden unexpected death in epilepsy (SUDEP). SUDEP is defined specifically as the sudden, unexpected, witnessed or unwitnessed, nontraumatic and nondrowning death in patients with epilepsy with or without evidence for a seizure, and excluding documented status epilepticus, in which post mortem examination does not reveal a structural or toxicologic cause for death   I had a long d/w patient about old strokes, risk for recurrent stroke/TIAs, personally independently reviewed imaging studies and stroke evaluation results and answered questions. Likely due to small vessel disease. Continue Plavix and ASA and statin for secondary stroke prevention and maintain strict control of hypertension with blood pressure goal below 130/90, diabetes with hemoglobin A1c goal below 6.5% and lipids with LDL cholesterol goal below 70 mg/dL. I also advised the patient to eat a healthy diet with plenty of whole grains, cereals, fruits and vegetables, exercise regularly and maintain ideal body weight .  Discussed that for stroke prevention, plavix only is sufficient per neurology but if he is on Plavix and Aspirin for other reasons such as cardiac then he can continue and follo wup with provider who has prescribed both concurrently.  Being on both plavix and aspirin increases risk of bleeding, this was discussed with patient and wife.   For any repeat episodes of alteration of consciousness proceed to the emergency room if any more episodes and also inform our office. If that occurs we'll likely increase his Keppra to 750 or thousand milligrams twice a day.  Cc: Velna Hatchet, MD  Sarina Ill, MD  [facility] [address] [place], [place] [address]  Phone [phone] Fax [phone]  A total of 30 minutes was spent face-to-face with this patient. Over half this time was spent on counseling patient on the seizure diagnosis and different diagnostic and therapeutic options available.

## 2016-08-13 NOTE — Progress Notes (Signed)
Subjective:    Patient ID: Larry Ortega, male    DOB: 03-May-1924, 82 y.o.   MRN: [id number]  HPI   Larry Ortega is here in follow up of his encephalopathy and gait disorder. He has been doing well. His mentation is back to baseline. Wife states he's doing everything he was doing prior to his seizure. He denies any futher seizures. He wants to drive.  He occasionally loses his balance but hasn't fell. He denies pain. Bowel and bladder function is normal.   Pain Inventory Average Pain 0 Pain Right Now 0 My pain is no pain  In the last 24 hours, has pain interfered with the following? General activity 0 Relation with others 0 Enjoyment of life 0 What TIME of day is your pain at its worst? no pain Sleep (in general) NA  Pain is worse with: no pain Pain improves with: no pain Relief from Meds: no pain  Mobility walk without assistance  Function retired  Neuro/Psych No problems in this area  Prior Studies Any changes since last visit?  no  Physicians involved in your care Any changes since last visit?  no   No family history on file. Social History   Social History  . Marital status: Married    Spouse name: N/A  . Number of children: N/A  . Years of education: N/A   Occupational History  . retired    Social History Main Topics  . Smoking status: Never Smoker  . Smokeless tobacco: Never Used  . Alcohol use None  . Drug use: Unknown  . Sexual activity: Not Asked   Other Topics Concern  . None   Social History Narrative   No alcohol or drug use   Past Surgical History:  Procedure Laterality Date  . BOWEL RESECTION    . CHOLECYSTECTOMY    . eyelid surgery     Past Medical History:  Diagnosis Date  . Bilateral carotid artery occlusion   . Cancer [facility])    prostate  . Chronic kidney disease   . Hypertensive heart disease without congestive heart failure   . Lumbar spinal stenosis   . Lumbar spondylosis    BP (!) 143/75   Pulse 75   Resp 16    SpO2 95%   Opioid Risk Score:   Fall Risk Score:  `1  Depression screen PHQ 2/9  Depression screen [facility] 2/9 08/13/2016 05/16/2016  Decreased Interest 0 0  Down, Depressed, Hopeless 0 0  PHQ - 2 Score 0 0   Review of Systems  Constitutional: Negative.   HENT: Negative.   Eyes: Negative.   Respiratory: Negative.   Cardiovascular: Negative.   Gastrointestinal: Negative.   Endocrine: Negative.   Genitourinary: Negative.   Musculoskeletal: Negative.   Skin: Negative.   Neurological: Negative.   Hematological: Negative.   Psychiatric/Behavioral: Negative.   All other systems reviewed and are negative.      Objective:   Physical Exam Constitutional: NAD. Vital signs reviewed.  HENT: Ncat  Eyes: EOM and Conj are normal. Right eye ptosis.  Cardiovascular: RRR. Respiratory:CTA B.  GI: BS+ Musculoskeletal: He exhibits no edemaor tenderness.  Neurological: He is alert and oriented x3, normal concentration. Appropriate language. No CN deficits Motor: 5/5 throughout. Decreased LT in both feet/legs steppage gait pattern.  Skin: Skin is warmand dry. No erythema.  Psychiatric: His mood appropriate   Assessment & Plan:  1. Hx of encephalopathy            -resolved  -  reviewed exercises for home to help improve gait pattern, balance  -encourage gym membership, water walking 2. Seizures             Cont keppra bid             Cont follow up with Neurology  -no driving until cleared by neurology (likely 1 year post event)  3. HTN             improved control Fifteen minutes of face to face patient care time were spent during this visit. All questions were encouraged and answered.  Follow up PRN

## 2016-08-13 NOTE — Patient Instructions (Signed)
WORK ON YOUR WALKING AT HOME---MORE TOE LIFTING AND LESS KNEE LIFT WHEN YOU SWING YOUR LEG.    PLEASE CALL ME WITH ANY PROBLEMS OR QUESTIONS [phone])   HAPPY THANKSGIVING!!!!

## 2016-08-19 DIAGNOSIS — I1310 Hypertensive heart and chronic kidney disease without heart failure, with stage 1 through stage 4 chronic kidney disease, or unspecified chronic kidney disease: Secondary | ICD-10-CM | POA: Diagnosis not present

## 2016-08-19 DIAGNOSIS — N183 Chronic kidney disease, stage 3 (moderate): Secondary | ICD-10-CM | POA: Diagnosis not present

## 2016-09-19 DIAGNOSIS — I1310 Hypertensive heart and chronic kidney disease without heart failure, with stage 1 through stage 4 chronic kidney disease, or unspecified chronic kidney disease: Secondary | ICD-10-CM | POA: Diagnosis not present

## 2016-09-19 DIAGNOSIS — N183 Chronic kidney disease, stage 3 (moderate): Secondary | ICD-10-CM | POA: Diagnosis not present

## 2016-10-22 DIAGNOSIS — N183 Chronic kidney disease, stage 3 (moderate): Secondary | ICD-10-CM | POA: Diagnosis not present

## 2016-10-22 DIAGNOSIS — I1310 Hypertensive heart and chronic kidney disease without heart failure, with stage 1 through stage 4 chronic kidney disease, or unspecified chronic kidney disease: Secondary | ICD-10-CM | POA: Diagnosis not present

## 2016-10-22 DIAGNOSIS — R600 Localized edema: Secondary | ICD-10-CM | POA: Diagnosis not present

## 2016-11-20 DIAGNOSIS — I1310 Hypertensive heart and chronic kidney disease without heart failure, with stage 1 through stage 4 chronic kidney disease, or unspecified chronic kidney disease: Secondary | ICD-10-CM | POA: Diagnosis not present

## 2016-11-20 DIAGNOSIS — N183 Chronic kidney disease, stage 3 (moderate): Secondary | ICD-10-CM | POA: Diagnosis not present

## 2016-12-03 DIAGNOSIS — M47816 Spondylosis without myelopathy or radiculopathy, lumbar region: Secondary | ICD-10-CM | POA: Diagnosis not present

## 2016-12-03 DIAGNOSIS — M5126 Other intervertebral disc displacement, lumbar region: Secondary | ICD-10-CM | POA: Diagnosis not present

## 2016-12-03 DIAGNOSIS — I6523 Occlusion and stenosis of bilateral carotid arteries: Secondary | ICD-10-CM | POA: Diagnosis not present

## 2016-12-09 DIAGNOSIS — L570 Actinic keratosis: Secondary | ICD-10-CM | POA: Diagnosis not present

## 2016-12-24 DIAGNOSIS — H348111 Central retinal vein occlusion, right eye, with retinal neovascularization: Secondary | ICD-10-CM | POA: Diagnosis not present

## 2016-12-24 DIAGNOSIS — Z961 Presence of intraocular lens: Secondary | ICD-10-CM | POA: Diagnosis not present

## 2016-12-26 DIAGNOSIS — H34811 Central retinal vein occlusion, right eye, with macular edema: Secondary | ICD-10-CM | POA: Diagnosis not present

## 2017-01-10 DIAGNOSIS — I1310 Hypertensive heart and chronic kidney disease without heart failure, with stage 1 through stage 4 chronic kidney disease, or unspecified chronic kidney disease: Secondary | ICD-10-CM | POA: Diagnosis not present

## 2017-01-10 DIAGNOSIS — N183 Chronic kidney disease, stage 3 (moderate): Secondary | ICD-10-CM | POA: Diagnosis not present

## 2017-01-13 DIAGNOSIS — R946 Abnormal results of thyroid function studies: Secondary | ICD-10-CM | POA: Diagnosis not present

## 2017-01-13 DIAGNOSIS — R7309 Other abnormal glucose: Secondary | ICD-10-CM | POA: Diagnosis not present

## 2017-01-13 DIAGNOSIS — I10 Essential (primary) hypertension: Secondary | ICD-10-CM | POA: Diagnosis not present

## 2017-01-13 DIAGNOSIS — N183 Chronic kidney disease, stage 3 (moderate): Secondary | ICD-10-CM | POA: Diagnosis not present

## 2017-01-15 DIAGNOSIS — R351 Nocturia: Secondary | ICD-10-CM | POA: Diagnosis not present

## 2017-01-15 DIAGNOSIS — C61 Malignant neoplasm of prostate: Secondary | ICD-10-CM | POA: Diagnosis not present

## 2017-01-20 DIAGNOSIS — Z1389 Encounter for screening for other disorder: Secondary | ICD-10-CM | POA: Diagnosis not present

## 2017-01-20 DIAGNOSIS — H348122 Central retinal vein occlusion, left eye, stable: Secondary | ICD-10-CM | POA: Diagnosis not present

## 2017-01-20 DIAGNOSIS — E038 Other specified hypothyroidism: Secondary | ICD-10-CM | POA: Diagnosis not present

## 2017-01-20 DIAGNOSIS — I6529 Occlusion and stenosis of unspecified carotid artery: Secondary | ICD-10-CM | POA: Diagnosis not present

## 2017-01-20 DIAGNOSIS — M6281 Muscle weakness (generalized): Secondary | ICD-10-CM | POA: Diagnosis not present

## 2017-01-20 DIAGNOSIS — Z8546 Personal history of malignant neoplasm of prostate: Secondary | ICD-10-CM | POA: Diagnosis not present

## 2017-01-20 DIAGNOSIS — Z6823 Body mass index (BMI) 23.0-23.9, adult: Secondary | ICD-10-CM | POA: Diagnosis not present

## 2017-01-20 DIAGNOSIS — I25118 Atherosclerotic heart disease of native coronary artery with other forms of angina pectoris: Secondary | ICD-10-CM | POA: Diagnosis not present

## 2017-01-20 DIAGNOSIS — E1165 Type 2 diabetes mellitus with hyperglycemia: Secondary | ICD-10-CM | POA: Diagnosis not present

## 2017-01-20 DIAGNOSIS — R808 Other proteinuria: Secondary | ICD-10-CM | POA: Diagnosis not present

## 2017-01-20 DIAGNOSIS — Z0000 Encounter for general adult medical examination without abnormal findings: Secondary | ICD-10-CM | POA: Diagnosis not present

## 2017-01-20 DIAGNOSIS — H3412 Central retinal artery occlusion, left eye: Secondary | ICD-10-CM | POA: Diagnosis not present

## 2017-01-20 DIAGNOSIS — D692 Other nonthrombocytopenic purpura: Secondary | ICD-10-CM | POA: Diagnosis not present

## 2017-01-30 DIAGNOSIS — H34831 Tributary (branch) retinal vein occlusion, right eye, with macular edema: Secondary | ICD-10-CM | POA: Diagnosis not present

## 2017-02-14 DIAGNOSIS — I1310 Hypertensive heart and chronic kidney disease without heart failure, with stage 1 through stage 4 chronic kidney disease, or unspecified chronic kidney disease: Secondary | ICD-10-CM | POA: Diagnosis not present

## 2017-02-14 DIAGNOSIS — N183 Chronic kidney disease, stage 3 (moderate): Secondary | ICD-10-CM | POA: Diagnosis not present

## 2017-02-25 DIAGNOSIS — N183 Chronic kidney disease, stage 3 (moderate): Secondary | ICD-10-CM | POA: Diagnosis not present

## 2017-02-25 DIAGNOSIS — I1310 Hypertensive heart and chronic kidney disease without heart failure, with stage 1 through stage 4 chronic kidney disease, or unspecified chronic kidney disease: Secondary | ICD-10-CM | POA: Diagnosis not present

## 2017-02-27 DIAGNOSIS — H34811 Central retinal vein occlusion, right eye, with macular edema: Secondary | ICD-10-CM | POA: Diagnosis not present

## 2017-03-27 DIAGNOSIS — H34831 Tributary (branch) retinal vein occlusion, right eye, with macular edema: Secondary | ICD-10-CM | POA: Diagnosis not present

## 2017-03-27 DIAGNOSIS — H35373 Puckering of macula, bilateral: Secondary | ICD-10-CM | POA: Diagnosis not present

## 2017-03-27 DIAGNOSIS — H348111 Central retinal vein occlusion, right eye, with retinal neovascularization: Secondary | ICD-10-CM | POA: Diagnosis not present

## 2017-03-28 ENCOUNTER — Encounter: Payer: Self-pay | Admitting: Family

## 2017-04-03 ENCOUNTER — Other Ambulatory Visit: Payer: Self-pay

## 2017-04-03 DIAGNOSIS — I6523 Occlusion and stenosis of bilateral carotid arteries: Secondary | ICD-10-CM

## 2017-04-08 ENCOUNTER — Encounter: Payer: Self-pay | Admitting: Family

## 2017-04-08 ENCOUNTER — Ambulatory Visit (INDEPENDENT_AMBULATORY_CARE_PROVIDER_SITE_OTHER): Payer: Medicare Other | Admitting: Family

## 2017-04-08 ENCOUNTER — Ambulatory Visit (HOSPITAL_COMMUNITY)
Admission: RE | Admit: 2017-04-08 | Discharge: 2017-04-08 | Disposition: A | Discharge status: Home / Self Care | Payer: Medicare Other | Source: Ambulatory Visit | Attending: Vascular Surgery | Admitting: Vascular Surgery

## 2017-04-08 VITALS — BP 158/70 | HR 46 | Temp 97.4°F | Resp 20 | Ht 71.0 in | Wt 164.9 lb

## 2017-04-08 DIAGNOSIS — I6523 Occlusion and stenosis of bilateral carotid arteries: Secondary | ICD-10-CM | POA: Diagnosis not present

## 2017-04-08 DIAGNOSIS — I6521 Occlusion and stenosis of right carotid artery: Secondary | ICD-10-CM | POA: Diagnosis not present

## 2017-04-08 LAB — VAS US CAROTID
LCCAPDIAS: 16 cm/s
LCCAPSYS: 96 cm/s
LEFT VERTEBRAL DIAS: -14 cm/s
LICADDIAS: -18 cm/s
LICAPDIAS: 14 cm/s
Left CCA dist dias: -16 cm/s
Left CCA dist sys: -113 cm/s
Left ICA dist sys: -73 cm/s
Left ICA prox sys: 107 cm/s
RCCAPDIAS: 11 cm/s
RCCAPSYS: 84 cm/s
RIGHT CCA MID DIAS: 12 cm/s
RIGHT ECA DIAS: -8 cm/s
RIGHT VERTEBRAL DIAS: -9 cm/s
Right cca dist sys: -77 cm/s

## 2017-04-08 NOTE — Progress Notes (Signed)
Chief Complaint: Follow up Extracranial Carotid Artery Stenosis   History of Present Illness  Larry Ortega is a 83 y.o. male whom Dr. Donnetta Ortega has been monitoring for asymptomatic carotid disease. This was discovered with duplex about January of 2017. He is here today for follow-up. He reports no neurologic deficits. Specifically no transient ischemic attack or stroke. He does have no new cardiac disease.  Patient has not had previous carotid artery intervention.  Wife states pt had a seizure in July 2017, was told it was from stress dealing with a sick friend that soon died.   Pt has had 3 shots in his right eye, then will have laser treatment for this, wife states he received shots in the right eye due to "lack of oxygen".   Dr. Donnetta Ortega last evaluated pt on 04-02-16. At that time carotid duplex showed 40-59% stenosis in the right internal carotid artery and no significant stenosis left carotid. Stable asymptomatic carotid stenosis. Recommend yearly duplex to rule out progression.   He denies any claudication symptoms with walking.    Pt Diabetic: no Pt smoker: non-smoker  Pt meds include: Statin : yes ASA: yes Other anticoagulants/antiplatelets: Plavix   Past Medical History:  Diagnosis Date  . Bilateral carotid artery occlusion   . Cancer [facility])    prostate  . Chronic kidney disease   . Hypertensive heart disease without congestive heart failure   . Lumbar spinal stenosis   . Lumbar spondylosis     Social History Social History  Substance Use Topics  . Smoking status: Never Smoker  . Smokeless tobacco: Never Used  . Alcohol use Not on file    Family History No family history on file.  Surgical History Past Surgical History:  Procedure Laterality Date  . BOWEL RESECTION    . CHOLECYSTECTOMY    . eyelid surgery      No Known Allergies  Current Outpatient Prescriptions  Medication Sig Dispense Refill  . acetaminophen (TYLENOL) 325 MG tablet Take 1-2  tablets (325-650 mg total) by mouth every 6 (six) hours as needed for mild pain.    Marland Kitchen amLODipine (NORVASC) 5 MG tablet Take 1 tablet by mouth daily.    Marland Kitchen aspirin 81 MG tablet Take 81 mg by mouth daily.    . B Complex Vitamins (B COMPLEX PO) Take 1,000 mg by mouth daily.    Marland Kitchen CALCIUM PO Take 600 mg by mouth daily.    . Cholecalciferol (VITAMIN D3) 2000 units TABS Take by mouth daily.    . clopidogrel (PLAVIX) 75 MG tablet Take 75 mg by mouth daily.    Marland Kitchen levETIRAcetam (KEPPRA) 500 MG tablet Take 1 tablet (500 mg total) by mouth 2 (two) times daily. 180 tablet 5  . metoprolol tartrate (LOPRESSOR) 25 MG tablet Take 0.5 tablets (12.5 mg total) by mouth 2 (two) times daily.    . Omega-3 Fatty Acids (FISH OIL PO) Take 1,200 mg by mouth daily.    . simvastatin (ZOCOR) 20 MG tablet Take 1 tablet (20 mg total) by mouth daily at 6 PM. 30 tablet   . UNABLE TO FIND 1,000 mg daily. Cinnamon     No current facility-administered medications for this visit.     Review of Systems : See HPI for pertinent positives and negatives.  Physical Examination  Vitals:   04/08/17 1102 04/08/17 1108  BP: (!) 160/71 (!) 158/70  Pulse: (!) 46   Resp: 20   Temp: (!) 97.4 F (36.3 C)   TempSrc:  Oral   SpO2: 97%   Weight: 164 lb 14.4 oz (74.8 kg)   Height: 5\' 11"  (1.803 m)    Body mass index is 23 kg/m.  General: WDWN male in NAD GAIT: normal Eyes: PERRLA Pulmonary:  Respirations are non-labored, good air movement, CTAB, no rales, rhonchi, or wheezing.  Cardiac: regular rhythm, no detected murmur.  VASCULAR EXAM Carotid Bruits Right Left   Negative Negative     Abdominal aortic pulse is not palpable. Radial pulses are 2+ palpable and equal.                                                                                                                            LE Pulses Right Left       POPLITEAL  not palpable   not palpable       POSTERIOR TIBIAL   palpable    palpable        DORSALIS PEDIS       ANTERIOR TIBIAL  palpable   palpable     Gastrointestinal: soft, nontender, BS WNL, no r/g, no palpable masses.  Musculoskeletal: No muscle atrophy/wasting. M/S 5/5 throughout, extremities without ischemic changes.  Neurologic:  A&O X 3; appropriate affect, sensation is normal; speech is normal, CN 2-12 intact, pain and light touch intact in extremities, motor exam as listed above.    Assessment: Larry Ortega is a 83 y.o. male has experienced no symptoms of stroke or TIA.  I discussed with Dr. Donnetta Ortega pt HPI, physical exam results, carotid duplex results, and letter from pt ophthalmologist (wife brought) with concern re delayed arterial filling time on fluorescein angiography of right eye.   Last serum creatinine result on file was 1.17 on 04-30-16.   DATA Carotid Duplex (04/08/17): Right ICA: 40-59% stenosis. Left ICA: <40% stenosis Bilateral vertebral artery flow is antegrade.  Bilateral subclavian artery waveforms are normal.  No change since the last exam on 04-02-16.    Plan: Will schedule CTA of neck within the next couple of weeks and follow up with Dr. Donnetta Ortega.    I discussed in depth with the patient the nature of atherosclerosis, and emphasized the importance of maximal medical management including strict control of blood pressure, blood glucose, and lipid levels, obtaining regular exercise, and continued cessation of smoking.  The patient is aware that without maximal medical management the underlying atherosclerotic disease process will progress, limiting the benefit of any interventions. The patient was given information about stroke prevention and what symptoms should prompt the patient to seek immediate medical care. Thank you for allowing [place] to participate in this patient's care.  Larry Chambers, RN, MSN, FNP-C [facility] Office: [phone]  Clinic Physician: Early  04/08/17 11:10 AM

## 2017-04-08 NOTE — Patient Instructions (Signed)
Stroke Prevention Some medical conditions and behaviors are associated with an increased chance of having a stroke. You may prevent a stroke by making healthy choices and managing medical conditions. How can I reduce my risk of having a stroke?  Stay physically active. Get at least 30 minutes of activity on most or all days.  Do not smoke. It may also be helpful to avoid exposure to secondhand smoke.  Limit alcohol use. Moderate alcohol use is considered to be:  No more than 2 drinks per day for men.  No more than 1 drink per day for nonpregnant women.  Eat healthy foods. This involves:  Eating 5 or more servings of fruits and vegetables a day.  Making dietary changes that address high blood pressure (hypertension), high cholesterol, diabetes, or obesity.  Manage your cholesterol levels.  Making food choices that are high in fiber and low in saturated fat, trans fat, and cholesterol may control cholesterol levels.  Take any prescribed medicines to control cholesterol as directed by your health care provider.  Manage your diabetes.  Controlling your carbohydrate and sugar intake is recommended to manage diabetes.  Take any prescribed medicines to control diabetes as directed by your health care provider.  Control your hypertension.  Making food choices that are low in salt (sodium), saturated fat, trans fat, and cholesterol is recommended to manage hypertension.  Ask your health care provider if you need treatment to lower your blood pressure. Take any prescribed medicines to control hypertension as directed by your health care provider.  If you are 18-39 years of age, have your blood pressure checked every 3-5 years. If you are 40 years of age or older, have your blood pressure checked every year.  Maintain a healthy weight.  Reducing calorie intake and making food choices that are low in sodium, saturated fat, trans fat, and cholesterol are recommended to manage  weight.  Stop drug abuse.  Avoid taking birth control pills.  Talk to your health care provider about the risks of taking birth control pills if you are over 35 years old, smoke, get migraines, or have ever had a blood clot.  Get evaluated for sleep disorders (sleep apnea).  Talk to your health care provider about getting a sleep evaluation if you snore a lot or have excessive sleepiness.  Take medicines only as directed by your health care provider.  For some people, aspirin or blood thinners (anticoagulants) are helpful in reducing the risk of forming abnormal blood clots that can lead to stroke. If you have the irregular heart rhythm of atrial fibrillation, you should be on a blood thinner unless there is a good reason you cannot take them.  Understand all your medicine instructions.  Make sure that other conditions (such as anemia or atherosclerosis) are addressed. Get help right away if:  You have sudden weakness or numbness of the face, arm, or leg, especially on one side of the body.  Your face or eyelid droops to one side.  You have sudden confusion.  You have trouble speaking (aphasia) or understanding.  You have sudden trouble seeing in one or both eyes.  You have sudden trouble walking.  You have dizziness.  You have a loss of balance or coordination.  You have a sudden, severe headache with no known cause.  You have new chest pain or an irregular heartbeat. Any of these symptoms may represent a serious problem that is an emergency. Do not wait to see if the symptoms will go away.   Get medical help at once. Call your local emergency services (911 in [place] Do not drive yourself to the hospital. This information is not intended to replace advice given to you by your health care provider. Make sure you discuss any questions you have with your health care provider. Document Released: 10/17/2004 Document Revised: 02/15/2016 Document Reviewed: 03/12/2013 Elsevier  Interactive Patient Education  2017 [employer]  

## 2017-04-11 DIAGNOSIS — I1310 Hypertensive heart and chronic kidney disease without heart failure, with stage 1 through stage 4 chronic kidney disease, or unspecified chronic kidney disease: Secondary | ICD-10-CM | POA: Diagnosis not present

## 2017-04-11 DIAGNOSIS — N183 Chronic kidney disease, stage 3 (moderate): Secondary | ICD-10-CM | POA: Diagnosis not present

## 2017-04-17 DIAGNOSIS — H34811 Central retinal vein occlusion, right eye, with macular edema: Secondary | ICD-10-CM | POA: Diagnosis not present

## 2017-04-24 DIAGNOSIS — H34811 Central retinal vein occlusion, right eye, with macular edema: Secondary | ICD-10-CM | POA: Diagnosis not present

## 2017-04-25 DIAGNOSIS — N183 Chronic kidney disease, stage 3 (moderate): Secondary | ICD-10-CM | POA: Diagnosis not present

## 2017-04-25 DIAGNOSIS — I1310 Hypertensive heart and chronic kidney disease without heart failure, with stage 1 through stage 4 chronic kidney disease, or unspecified chronic kidney disease: Secondary | ICD-10-CM | POA: Diagnosis not present

## 2017-04-28 DIAGNOSIS — I25118 Atherosclerotic heart disease of native coronary artery with other forms of angina pectoris: Secondary | ICD-10-CM | POA: Diagnosis not present

## 2017-04-28 DIAGNOSIS — D692 Other nonthrombocytopenic purpura: Secondary | ICD-10-CM | POA: Diagnosis not present

## 2017-04-28 DIAGNOSIS — N183 Chronic kidney disease, stage 3 (moderate): Secondary | ICD-10-CM | POA: Diagnosis not present

## 2017-04-28 DIAGNOSIS — Z6823 Body mass index (BMI) 23.0-23.9, adult: Secondary | ICD-10-CM | POA: Diagnosis not present

## 2017-04-28 DIAGNOSIS — E038 Other specified hypothyroidism: Secondary | ICD-10-CM | POA: Diagnosis not present

## 2017-04-28 DIAGNOSIS — I10 Essential (primary) hypertension: Secondary | ICD-10-CM | POA: Diagnosis not present

## 2017-04-28 DIAGNOSIS — E1165 Type 2 diabetes mellitus with hyperglycemia: Secondary | ICD-10-CM | POA: Diagnosis not present

## 2017-05-05 ENCOUNTER — Encounter: Payer: Self-pay | Admitting: Vascular Surgery

## 2017-05-08 DIAGNOSIS — H34811 Central retinal vein occlusion, right eye, with macular edema: Secondary | ICD-10-CM | POA: Diagnosis not present

## 2017-05-12 DIAGNOSIS — R001 Bradycardia, unspecified: Secondary | ICD-10-CM | POA: Diagnosis not present

## 2017-05-12 DIAGNOSIS — I739 Peripheral vascular disease, unspecified: Secondary | ICD-10-CM | POA: Diagnosis not present

## 2017-05-12 DIAGNOSIS — R9431 Abnormal electrocardiogram [ECG] [EKG]: Secondary | ICD-10-CM | POA: Diagnosis not present

## 2017-05-12 DIAGNOSIS — E782 Mixed hyperlipidemia: Secondary | ICD-10-CM | POA: Diagnosis not present

## 2017-05-12 DIAGNOSIS — I350 Nonrheumatic aortic (valve) stenosis: Secondary | ICD-10-CM | POA: Diagnosis not present

## 2017-05-13 ENCOUNTER — Ambulatory Visit (INDEPENDENT_AMBULATORY_CARE_PROVIDER_SITE_OTHER): Payer: Medicare Other | Admitting: Vascular Surgery

## 2017-05-13 ENCOUNTER — Ambulatory Visit
Admission: RE | Admit: 2017-05-13 | Discharge: 2017-05-13 | Disposition: A | Discharge status: Home / Self Care | Payer: Medicare Other | Source: Ambulatory Visit | Attending: Family | Admitting: Family

## 2017-05-13 ENCOUNTER — Encounter: Payer: Self-pay | Admitting: Vascular Surgery

## 2017-05-13 VITALS — BP 128/59 | HR 55 | Ht 72.0 in | Wt 168.0 lb

## 2017-05-13 DIAGNOSIS — I6521 Occlusion and stenosis of right carotid artery: Secondary | ICD-10-CM

## 2017-05-13 DIAGNOSIS — I6523 Occlusion and stenosis of bilateral carotid arteries: Secondary | ICD-10-CM | POA: Diagnosis not present

## 2017-05-13 MED ORDER — IOPAMIDOL (ISOVUE-370) INJECTION 76%
75.0000 mL | Freq: Once | INTRAVENOUS | Status: AC | PRN
  Administered 2017-05-13: 75 mL via INTRAVENOUS

## 2017-05-13 NOTE — Progress Notes (Signed)
Vascular and Vein Specialist of [facility]  Patient name: Larry Ortega MRN: [id number] DOB: 1924/07/23 Sex: male  REASON FOR VISIT: Follow-up right carotid stenosis  HPI: Larry Ortega is a 83 y.o. male here today for follow-up. He's been followed in our office for moderate carotid disease. Recently his ophthalmologist was concern regarding flow to his retinal artery and therefore we have proceeded with a CT angiogram to rule out any critical stenosis. He did have calcification at his carotid bifurcation on his most recent ultrasound. He is here today for discussion of this. He denies any focal neurologic deficits. Did have a seizure.  Past Medical History:  Diagnosis Date  . Bilateral carotid artery occlusion   . Cancer [facility])    prostate  . Chronic kidney disease   . Hypertensive heart disease without congestive heart failure   . Lumbar spinal stenosis   . Lumbar spondylosis     No family history on file.  SOCIAL HISTORY: Social History  Substance Use Topics  . Smoking status: Never Smoker  . Smokeless tobacco: Never Used  . Alcohol use Not on file    No Known Allergies  Current Outpatient Prescriptions  Medication Sig Dispense Refill  . acetaminophen (TYLENOL) 325 MG tablet Take 1-2 tablets (325-650 mg total) by mouth every 6 (six) hours as needed for mild pain.    Marland Kitchen amLODipine (NORVASC) 5 MG tablet Take 1 tablet by mouth daily.    Marland Kitchen aspirin 81 MG tablet Take 81 mg by mouth daily.    . B Complex Vitamins (B COMPLEX PO) Take 1,000 mg by mouth daily.    Marland Kitchen CALCIUM PO Take 600 mg by mouth daily.    . Cholecalciferol (VITAMIN D3) 2000 units TABS Take by mouth daily.    . clopidogrel (PLAVIX) 75 MG tablet Take 75 mg by mouth daily.    Marland Kitchen levETIRAcetam (KEPPRA) 500 MG tablet Take 1 tablet (500 mg total) by mouth 2 (two) times daily. 180 tablet 5  . metoprolol tartrate (LOPRESSOR) 25 MG tablet Take 0.5 tablets (12.5 mg total) by mouth 2 (two)  times daily.    . Omega-3 Fatty Acids (FISH OIL PO) Take 1,200 mg by mouth daily.    . simvastatin (ZOCOR) 20 MG tablet Take 1 tablet (20 mg total) by mouth daily at 6 PM. 30 tablet   . UNABLE TO FIND 1,000 mg daily. Cinnamon     No current facility-administered medications for this visit.     REVIEW OF SYSTEMS:  [X]  denotes positive finding, [ ]  denotes negative finding Cardiac  Comments:  Chest pain or chest pressure:    Shortness of breath upon exertion:    Short of breath when lying flat:    Irregular heart rhythm:        Vascular    Pain in calf, thigh, or hip brought on by ambulation:    Pain in feet at night that wakes you up from your sleep:     Blood clot in your veins:    Leg swelling:           PHYSICAL EXAM: Vitals:   05/13/17 1451  BP: (!) 128/59  Pulse: (!) 55  SpO2: 95%  Weight: 168 lb (76.2 kg)  Height: 6' (1.829 m)    GENERAL: The patient is a well-nourished male, in no acute distress. The vital signs are documented above. CARDIOVASCULAR: Carotid arteries without bruits bilaterally. 2+ radial pulses bilaterally PULMONARY: There is good air exchange  MUSCULOSKELETAL: There are  no major deformities or cyanosis. NEUROLOGIC: No focal weakness or paresthesias are detected. SKIN: There are no ulcers or rashes noted. PSYCHIATRIC: The patient has a normal affect.  DATA:  CT angiogram reveals 65% right carotid stenosis at the bifurcation with calcifications and no significant left carotid stenosis. This is in agreement with this duplex  MEDICAL ISSUES: I discussed the findings and length with the patient and his wife present. I recommend continued six-month surveillance with ultrasound in our office. We will notify should he develop any focal neurologic deficits.    Rosetta Posner, MD FACS [facility] Vein Specialists of [facility] Tel [phone] Pager [phone]

## 2017-05-15 NOTE — Addendum Note (Signed)
Addended by: Rockford Leinen A on: 05/15/2017 08:51 AM   Modules accepted: Orders  

## 2017-06-12 ENCOUNTER — Inpatient Hospital Stay (HOSPITAL_COMMUNITY)
Admission: EM | Admit: 2017-06-12 | Discharge: 2017-06-15 | DRG: 392 | Disposition: A | Discharge status: Home / Self Care | Payer: Medicare Other | Attending: Internal Medicine | Admitting: Internal Medicine

## 2017-06-12 ENCOUNTER — Emergency Department (HOSPITAL_COMMUNITY): Payer: Medicare Other

## 2017-06-12 ENCOUNTER — Encounter (HOSPITAL_COMMUNITY): Payer: Self-pay | Admitting: Emergency Medicine

## 2017-06-12 DIAGNOSIS — Z7982 Long term (current) use of aspirin: Secondary | ICD-10-CM | POA: Diagnosis not present

## 2017-06-12 DIAGNOSIS — M48061 Spinal stenosis, lumbar region without neurogenic claudication: Secondary | ICD-10-CM | POA: Diagnosis present

## 2017-06-12 DIAGNOSIS — I10 Essential (primary) hypertension: Secondary | ICD-10-CM | POA: Diagnosis not present

## 2017-06-12 DIAGNOSIS — I2510 Atherosclerotic heart disease of native coronary artery without angina pectoris: Secondary | ICD-10-CM | POA: Diagnosis present

## 2017-06-12 DIAGNOSIS — D62 Acute posthemorrhagic anemia: Secondary | ICD-10-CM | POA: Diagnosis present

## 2017-06-12 DIAGNOSIS — K921 Melena: Secondary | ICD-10-CM | POA: Diagnosis not present

## 2017-06-12 DIAGNOSIS — G40909 Epilepsy, unspecified, not intractable, without status epilepticus: Secondary | ICD-10-CM | POA: Diagnosis present

## 2017-06-12 DIAGNOSIS — M47816 Spondylosis without myelopathy or radiculopathy, lumbar region: Secondary | ICD-10-CM | POA: Diagnosis present

## 2017-06-12 DIAGNOSIS — E785 Hyperlipidemia, unspecified: Secondary | ICD-10-CM | POA: Diagnosis present

## 2017-06-12 DIAGNOSIS — K5792 Diverticulitis of intestine, part unspecified, without perforation or abscess without bleeding: Secondary | ICD-10-CM | POA: Diagnosis not present

## 2017-06-12 DIAGNOSIS — Z79899 Other long term (current) drug therapy: Secondary | ICD-10-CM

## 2017-06-12 DIAGNOSIS — R569 Unspecified convulsions: Secondary | ICD-10-CM | POA: Diagnosis not present

## 2017-06-12 DIAGNOSIS — K5732 Diverticulitis of large intestine without perforation or abscess without bleeding: Principal | ICD-10-CM | POA: Diagnosis present

## 2017-06-12 DIAGNOSIS — N183 Chronic kidney disease, stage 3 unspecified: Secondary | ICD-10-CM | POA: Diagnosis present

## 2017-06-12 DIAGNOSIS — Z9049 Acquired absence of other specified parts of digestive tract: Secondary | ICD-10-CM

## 2017-06-12 DIAGNOSIS — I129 Hypertensive chronic kidney disease with stage 1 through stage 4 chronic kidney disease, or unspecified chronic kidney disease: Secondary | ICD-10-CM | POA: Diagnosis present

## 2017-06-12 DIAGNOSIS — K5730 Diverticulosis of large intestine without perforation or abscess without bleeding: Secondary | ICD-10-CM | POA: Diagnosis not present

## 2017-06-12 DIAGNOSIS — I6523 Occlusion and stenosis of bilateral carotid arteries: Secondary | ICD-10-CM | POA: Diagnosis present

## 2017-06-12 DIAGNOSIS — R739 Hyperglycemia, unspecified: Secondary | ICD-10-CM | POA: Diagnosis not present

## 2017-06-12 DIAGNOSIS — Z8673 Personal history of transient ischemic attack (TIA), and cerebral infarction without residual deficits: Secondary | ICD-10-CM | POA: Diagnosis not present

## 2017-06-12 DIAGNOSIS — Z23 Encounter for immunization: Secondary | ICD-10-CM | POA: Diagnosis not present

## 2017-06-12 DIAGNOSIS — E876 Hypokalemia: Secondary | ICD-10-CM | POA: Diagnosis present

## 2017-06-12 DIAGNOSIS — Z7902 Long term (current) use of antithrombotics/antiplatelets: Secondary | ICD-10-CM

## 2017-06-12 DIAGNOSIS — Z6822 Body mass index (BMI) 22.0-22.9, adult: Secondary | ICD-10-CM | POA: Diagnosis not present

## 2017-06-12 HISTORY — DX: Atherosclerotic heart disease of native coronary artery without angina pectoris: I25.10

## 2017-06-12 HISTORY — DX: Unspecified convulsions: R56.9

## 2017-06-12 LAB — COMPREHENSIVE METABOLIC PANEL
ALT: 16 U/L — ABNORMAL LOW (ref 17–63)
AST: 17 U/L (ref 15–41)
Albumin: 3.1 g/dL — ABNORMAL LOW (ref 3.5–5.0)
Alkaline Phosphatase: 49 U/L (ref 38–126)
Anion gap: 10 (ref 5–15)
BUN: 24 mg/dL — AB (ref 6–20)
CHLORIDE: 106 mmol/L (ref 101–111)
CO2: 20 mmol/L — AB (ref 22–32)
Calcium: 8.1 mg/dL — ABNORMAL LOW (ref 8.9–10.3)
Creatinine, Ser: 1.47 mg/dL — ABNORMAL HIGH (ref 0.61–1.24)
GFR calc Af Amer: 46 mL/min — ABNORMAL LOW (ref >60)
GFR calc non Af Amer: 40 mL/min — ABNORMAL LOW (ref >60)
GLUCOSE: 134 mg/dL — AB (ref 65–99)
Potassium: 4.1 mmol/L (ref 3.5–5.1)
Sodium: 136 mmol/L (ref 135–145)
Total Bilirubin: 0.7 mg/dL (ref 0.3–1.2)
Total Protein: 5.8 g/dL — ABNORMAL LOW (ref 6.5–8.1)

## 2017-06-12 LAB — CBC
HEMATOCRIT: 27.7 % — AB (ref 39.0–52.0)
HEMOGLOBIN: 9.1 g/dL — AB (ref 13.0–17.0)
MCH: 30.6 pg (ref 26.0–34.0)
MCHC: 32.9 g/dL (ref 30.0–36.0)
MCV: 93.3 fL (ref 78.0–100.0)
Platelets: 253 10*3/uL (ref 150–400)
RBC: 2.97 MIL/uL — ABNORMAL LOW (ref 4.22–5.81)
RDW: 13.6 % (ref 11.5–15.5)
WBC: 17.3 10*3/uL — ABNORMAL HIGH (ref 4.0–10.5)

## 2017-06-12 LAB — PROTIME-INR
INR: 1.05
PROTHROMBIN TIME: 13.6 s (ref 11.4–15.2)

## 2017-06-12 LAB — POC OCCULT BLOOD, ED: Fecal Occult Bld: POSITIVE — AB

## 2017-06-12 LAB — TYPE AND SCREEN
ABO/RH(D): O NEG
Antibody Screen: NEGATIVE

## 2017-06-12 LAB — ABO/RH: ABO/RH(D): O NEG

## 2017-06-12 MED ORDER — FAMOTIDINE IN NACL 20-0.9 MG/50ML-% IV SOLN
20.0000 mg | Freq: Once | INTRAVENOUS | Status: AC
  Administered 2017-06-12: 20 mg via INTRAVENOUS
  Filled 2017-06-12: qty 50

## 2017-06-12 MED ORDER — SODIUM CHLORIDE 0.9 % IV BOLUS (SEPSIS)
1000.0000 mL | Freq: Once | INTRAVENOUS | Status: AC
  Administered 2017-06-12: 1000 mL via INTRAVENOUS

## 2017-06-12 MED ORDER — METRONIDAZOLE IN NACL 5-0.79 MG/ML-% IV SOLN
500.0000 mg | Freq: Once | INTRAVENOUS | Status: AC
  Administered 2017-06-12: 500 mg via INTRAVENOUS
  Filled 2017-06-12: qty 100

## 2017-06-12 MED ORDER — IOPAMIDOL (ISOVUE-300) INJECTION 61%
INTRAVENOUS | Status: AC
  Administered 2017-06-12: 100 mL
  Filled 2017-06-12: qty 100

## 2017-06-12 MED ORDER — PANTOPRAZOLE SODIUM 40 MG IV SOLR
40.0000 mg | Freq: Once | INTRAVENOUS | Status: AC
  Administered 2017-06-12: 40 mg via INTRAVENOUS
  Filled 2017-06-12: qty 40

## 2017-06-12 MED ORDER — SODIUM CHLORIDE 0.9 % IV SOLN
1000.0000 mg | Freq: Once | INTRAVENOUS | Status: AC
  Administered 2017-06-12: 1000 mg via INTRAVENOUS
  Filled 2017-06-12: qty 10

## 2017-06-12 MED ORDER — CIPROFLOXACIN IN D5W 400 MG/200ML IV SOLN
500.0000 mg | Freq: Once | INTRAVENOUS | Status: DC
  Filled 2017-06-12: qty 400

## 2017-06-12 NOTE — H&P (Signed)
History and Physical    Larry Ortega IRW:[id number] DOB: 13-Feb-1924 DOA: 06/12/2017  PCP: Velna Hatchet, MD Consultants:  Sabra Heck - cardiology; [place]; Neurology in [place] Patient coming from:  Home - lives with wife; [facility]: wife, [phone] or [phone]; son, [phone]  Chief Complaint: hematochezia  HPI: Larry Ortega is a 83 y.o. male with medical history significant of seizure; HTN; CKD; CAD; and carotid stenosis presenting with hematochezia.  "I thought I was in good shape" until he started passing blood in his stools.  Started Monday of this week.  Bright red blood per rectum.  It is decreasing in frequency.  No bleeding between BMs.  Solid BMs with blood in the water.  Painless.  He does some LLQ TTP.  Fever last night, subjective while sleeping.  Diaphoresis overnight and they think the fever broke then.  Slightly more weak than usual.  Not lightheaded with standing.  No SOB.  He was hospitalized in 7/17-8/17 with encephalopathy.  He seized x 2 and was intubated for airway protection and started on Keppra.  He was found to have chronic right frontal and cerebellar infarcts and an EEG showed nonspecific diffuse cerebral dysfunction. He was discharged to Rehab.  ED Course: GI bleeding on ASA and Plavix.  WBC 17.3, Hgb 9.1,  IVF, Protonix, Pepcid.  CT with uncomplicated diverticulitis.  Started on Cipro/Flagyl.  Review of Systems: As per HPI; otherwise review of systems reviewed and negative.   Ambulatory Status:  Ambulates with a cane sometimes  Past Medical History:  Diagnosis Date  . Bilateral carotid artery occlusion   . CAD (coronary artery disease)    no h/o stent or bypass, followed annually  . Cancer [facility])    prostate  . Chronic kidney disease   . Hypertensive heart disease without congestive heart failure   . Lumbar spinal stenosis   . Lumbar spondylosis   . Seizure ([place]) 2017    Past Surgical History:  Procedure Laterality Date  . BOWEL  RESECTION    . CHOLECYSTECTOMY    . eyelid surgery      Social History   Social History  . Marital status: Married    Spouse name: N/A  . Number of children: N/A  . Years of education: N/A   Occupational History  . retired    Social History Main Topics  . Smoking status: Never Smoker  . Smokeless tobacco: Never Used  . Alcohol use No  . Drug use: No  . Sexual activity: Not on file   Other Topics Concern  . Not on file   Social History Narrative   No alcohol or drug use    No Known Allergies  No family history on file.  Prior to Admission medications   Medication Sig Start Date End Date Taking? Authorizing Provider  acetaminophen (TYLENOL) 325 MG tablet Take 1-2 tablets (325-650 mg total) by mouth every 6 (six) hours as needed for mild pain. 05/02/16   Love, Ivan Anchors, PA-C  amLODipine (NORVASC) 5 MG tablet Take 1 tablet by mouth daily. 07/23/16   [provider]  aspirin 81 MG tablet Take 81 mg by mouth daily.    [provider]  B Complex Vitamins (B COMPLEX PO) Take 1,000 mg by mouth daily.    [provider]  CALCIUM PO Take 600 mg by mouth daily.    [provider]  Cholecalciferol (VITAMIN D3) 2000 units TABS Take by mouth daily.    [provider]  clopidogrel (PLAVIX)  75 MG tablet Take 75 mg by mouth daily.    [provider]  levETIRAcetam (KEPPRA) 500 MG tablet Take 1 tablet (500 mg total) by mouth 2 (two) times daily. 08/13/16   Melvenia Beam, MD  metoprolol tartrate (LOPRESSOR) 25 MG tablet Take 0.5 tablets (12.5 mg total) by mouth 2 (two) times daily. 05/02/16   LoveIvan Anchors, PA-C  Omega-3 Fatty Acids (FISH OIL PO) Take 1,200 mg by mouth daily.    [provider]  simvastatin (ZOCOR) 20 MG tablet Take 1 tablet (20 mg total) by mouth daily at 6 PM. 05/02/16   Love, Ivan Anchors, PA-C  UNABLE TO FIND 1,000 mg daily. Cinnamon    [provider]    Physical Exam: Vitals:   06/12/17 2053  06/12/17 2100 06/12/17 2230 06/12/17 2230  BP: 118/63 128/78 118/61   Pulse: 75 73 81   Resp:      Temp:      TempSrc:      SpO2: 98% 99% 96%   Weight:    75.8 kg (167 lb)  Height:    6' (1.829 m)     General: Appears calm and comfortable and is NAD Eyes:  PERRL, EOMI, normal lids, iris ENT:  grossly normal hearing, lips & tongue, mmm Neck:  no LAD, masses or thyromegaly; no carotid bruits Cardiovascular:  RRR, no m/r/g. No LE edema.  Respiratory:   CTA bilaterally with no wheezes/rales/rhonchi.  Normal respiratory effort. Abdomen:  soft, TTP in LLQ, mildly TTP in RLQ, ND, NABS Skin:  no rash or induration seen on limited exam Musculoskeletal:  grossly normal tone BUE/BLE, good ROM, no bony abnormality Psychiatric:  grossly normal mood and affect, speech fluent and appropriate, AOx3 Neurologic:  CN 2-12 grossly intact, moves all extremities in coordinated fashion, sensation intact    Radiological Exams on Admission: Ct Abdomen Pelvis W Contrast  Result Date: 06/12/2017 CLINICAL DATA:  Rectal bleeding since Monday.  Hemoglobin 9.0. EXAM: CT ABDOMEN AND PELVIS WITH CONTRAST TECHNIQUE: Multidetector CT imaging of the abdomen and pelvis was performed using the standard protocol following bolus administration of intravenous contrast. CONTRAST:  147mL ISOVUE-300 IOPAMIDOL (ISOVUE-300) INJECTION 61% COMPARISON:  CT abdomen dated 06/26/2013. FINDINGS: Lower chest: No acute abnormality. Hepatobiliary: No focal liver abnormality is seen. Status post cholecystectomy. No biliary dilatation. Pancreas: Unremarkable. No pancreatic ductal dilatation or surrounding inflammatory changes. Spleen: Normal in size without focal abnormality. Adrenals/Urinary Tract: Adrenal glands appear normal. Bilateral renal cysts. Small bilateral nonobstructing renal stones. No ureteral or bladder calculi. Stomach/Bowel: Extensive diverticulosis throughout the upper sigmoid colon and descending colon. Additional scattered  diverticulosis throughout the remainder of the colon. Thickening of the walls of the lower descending colon with surrounding pericolonic inflammation/stranding indicating acute diverticulitis. No dilated large or small bowel loops. Stomach is unremarkable. Appendix is not seen but there are no inflammatory changes about the cecum to suggest acute appendicitis. Vascular/Lymphatic: Aortic atherosclerosis. No enlarged abdominal or pelvic lymph nodes. Reproductive: Prostate is unremarkable. Other: No abscess collection seen. No free intraperitoneal air seen. Musculoskeletal: Degenerative change within the lower lumbar spine, mild to moderate in degree. No acute or suspicious osseous finding. IMPRESSION: 1. Acute diverticulitis within the lower descending colon, uncomplicated. No abscess collection or free intraperitoneal air. No associated bowel obstruction. 2. Extensive colonic diverticulosis. 3. Aortic atherosclerosis. 4. Additional chronic/incidental findings detailed above. Electronically Signed   By: Franki Cabot M.D.   On: 06/12/2017 21:47    EKG: not done   Labs on  Admission: I have personally reviewed the available labs and imaging studies at the time of the admission.  Pertinent labs:   Glucose 134 BUN 24/Creatinine 1.47/GFR 40; 25/1.17/53 in 8/17 Albumin 3.1 WBC 17.3 Hgb 9.1; 12.8 in 8/17 Heme positive  Assessment/Plan Principal Problem:   Diverticulitis Active Problems:   Essential hypertension   Seizures (HCC)   Hematochezia   Hyperglycemia   CKD (chronic kidney disease), stage III   Diverticulitis with hematochezia -Patient with presenting c/o hematochezia -No UGI symptoms and bleeding was bright red - will not continue Protonix and Pepcid -CT showed acute diverticulitis, uncomplicated -Will continue treatment with Cipro/Flagyl -Inpatient admission -Uncertain baseline hgb but patient with minimal symptoms of anemia; will recheck in AM -Continue to monitory for recurrent  bleeding   CKD -Uncertain current baseline but not markedly different than 2 years ago -He is being hydrated so if there is an acute component then that should improve -Will follow with BMP in AM  Seizures -Continue Keppra -He received tonight's dose via IV but will change to PO dosing starting tomorrow AM  HTN -Hold Norvasc due to borderline hypotension -Continue Lopressor to prevent rebound tachycardia  Hyperglycemia -May be stress response -Will follow with fasting AM labs -It is unlikely that he will need acute or chronic treatment for this issue  DVT prophylaxis: SCDs Code Status:  Full - confirmed with patient/family Family Communication: Wife and son present throughout evaluation  Disposition Plan:  Home once clinically improved Consults called: None  Admission status: Admit - It is my clinical opinion that admission to INPATIENT is reasonable and necessary because this patient will require at least 2 midnights in the hospital to treat this condition based on the medical complexity of the problems presented.  Given the aforementioned information, the predictability of an adverse outcome is felt to be significant.    Karmen Bongo MD [facility]  If note is complete, please contact covering daytime or nighttime physician. [email or web address] Password [email or web address]  06/13/2017, 12:05 AM

## 2017-06-12 NOTE — ED Triage Notes (Addendum)
Pt was sent from pcp for rectal bleeding since Monday pt reports bright red blood, per pcp hemoglobin was 9.0. Pt reports no stools today.

## 2017-06-12 NOTE — ED Provider Notes (Signed)
[facility] [place] DEPT Provider Note   CSN: [id number] Arrival date & time: 06/12/17  1258     History   Chief Complaint Chief Complaint  Patient presents with  . Rectal Bleeding    HPI Larry Ortega is a 83 y.o. male with h/o HTN and carotid artery stenosis, CAD on plavix, renal insufficiency, presents to ED for evaluation of rectal bleeding x 3 days associated with decreased appetite, lower abdominal pain (L>R), palpable fever, sweats. Stool is described as formed with maroon colored blood mixed in and in the toilet water. No rectal pain with BM. No diarrhea. 1-2 BM daily for the last 3 days, last BM this morning. Has been traveling and eating out at restaurants the last 3 days. No known sick contacts with similar GI symptoms. Was seen by PCP earlier today and was sent to ED for evaluation of GI bleed with elevated WBC and Hgb of 9. He denies light-headedness, chest pain, fatigue, SOB. Has h/o diverticulitis. H/o one abdominal surgery (does not know what type). No previous h/o PUD, GI bleed, varices, a-fib or anemia. Denies heavy ETOH or NSAIDs use. No vomiting, CP, SOB, dysuria. HPI  Past Medical History:  Diagnosis Date  . Bilateral carotid artery occlusion   . Cancer [facility])    prostate  . Chronic kidney disease   . Hypertensive heart disease without congestive heart failure   . Lumbar spinal stenosis   . Lumbar spondylosis     Patient Active Problem List   Diagnosis Date Noted  . Abnormal laboratory test result 05/16/2016  . Bradycardia 05/16/2016  . Hypokalemia 05/02/2016  . Prerenal azotemia 05/02/2016  . Encephalopathy 04/25/2016  . Essential hypertension 04/25/2016  . Seizures ([place]) 04/25/2016  . Aphasia   . Acute respiratory failure ([place])   . Cerebral infarction due to unspecified mechanism 04/20/2016  . Cerebrovascular accident (CVA) due to thrombosis of precerebral artery ([place]) 04/20/2016  . Cerebral infarction due to thrombosis of precerebral artery ([place])   .  Carotid stenosis 09/26/2015    Past Surgical History:  Procedure Laterality Date  . BOWEL RESECTION    . CHOLECYSTECTOMY    . eyelid surgery         Home Medications    Prior to Admission medications   Medication Sig Start Date End Date Taking? Authorizing Provider  acetaminophen (TYLENOL) 325 MG tablet Take 1-2 tablets (325-650 mg total) by mouth every 6 (six) hours as needed for mild pain. 05/02/16   Love, Ivan Anchors, PA-C  amLODipine (NORVASC) 5 MG tablet Take 1 tablet by mouth daily. 07/23/16   [provider]  aspirin 81 MG tablet Take 81 mg by mouth daily.    [provider]  B Complex Vitamins (B COMPLEX PO) Take 1,000 mg by mouth daily.    [provider]  CALCIUM PO Take 600 mg by mouth daily.    [provider]  Cholecalciferol (VITAMIN D3) 2000 units TABS Take by mouth daily.    [provider]  clopidogrel (PLAVIX) 75 MG tablet Take 75 mg by mouth daily.    [provider]  levETIRAcetam (KEPPRA) 500 MG tablet Take 1 tablet (500 mg total) by mouth 2 (two) times daily. 08/13/16   Melvenia Beam, MD  metoprolol tartrate (LOPRESSOR) 25 MG tablet Take 0.5 tablets (12.5 mg total) by mouth 2 (two) times daily. 05/02/16   LoveIvan Anchors, PA-C  Omega-3 Fatty Acids (FISH OIL PO) Take 1,200 mg by mouth daily.    [provider]  simvastatin (ZOCOR) 20 MG tablet Take 1 tablet (20 mg total) by mouth daily at 6 PM. 05/02/16   Love, Ivan Anchors, PA-C  UNABLE TO FIND 1,000 mg daily. Cinnamon    [provider]    Family History No family history on file.  Social History Social History  Substance Use Topics  . Smoking status: Never Smoker  . Smokeless tobacco: Never Used  . Alcohol use Not on file     Allergies   Patient has no known allergies.   Review of Systems Review of Systems  Constitutional: Positive for appetite change, diaphoresis and fever.  Respiratory: Negative for shortness of breath.     Cardiovascular: Negative for chest pain.  Gastrointestinal: Positive for abdominal pain, blood in stool and nausea. Negative for constipation, diarrhea and vomiting.  Genitourinary: Negative for dysuria.  Musculoskeletal: Negative for back pain.  Hematological: Bruises/bleeds easily.     Physical Exam Updated Vital Signs BP 118/63   Pulse 75   Temp 98.5 F (36.9 C) (Oral)   Resp 16   SpO2 98%   Physical Exam  Constitutional: He is oriented to person, place, and time. He appears well-developed and well-nourished. No distress.  Pleasant. NAD.   HENT:  Head: Normocephalic and atraumatic.  Nose: Nose normal.  Mouth/Throat: No oropharyngeal exudate.  Moist mucous membranes No conjunctival pallor  Eyes: Pupils are equal, round, and reactive to light. Conjunctivae and EOM are normal.  Neck: Normal range of motion. Neck supple.  Cardiovascular: Normal rate, regular rhythm, normal heart sounds and intact distal pulses.   No murmur heard. <2 cap refill in fingers and toes DP and radial pulses 2+ bilaterally  Pulmonary/Chest: Effort normal and breath sounds normal. No respiratory distress. He has no wheezes. He has no rales.  Abdominal: Soft. Bowel sounds are normal. He exhibits no distension. There is tenderness. There is guarding. There is no rebound.  Diffuse lower abdominal tenderness with subjective guarding (L>R) No suprapubic or CVA tenderness  Genitourinary: Rectal exam shows guaiac positive stool.  Genitourinary Comments: Chaperone was present.  I was able to palpate the first 2-3cm of the rectum digitally. Patient able to tolerate examination.  Patient without pain in perianal/rectal area with palpation.  +1 non thrombosed, non tender external hemorrhoid at 6 o'clock +1 non tender internal hemorrhoid at 6 o'clock No induration or swelling of the perianal skin.   +Positive for gross blood, stool color is maroon No signs of perirectal abscess.   DRE reveals good sphincter  tone.    Musculoskeletal: Normal range of motion. He exhibits no deformity.  Lymphadenopathy:    He has no cervical adenopathy.  Neurological: He is alert and oriented to person, place, and time.  Skin: Skin is warm and dry. Capillary refill takes less than 2 seconds.  Psychiatric: He has a normal mood and affect. His behavior is normal. Judgment and thought content normal.  Nursing note and vitals reviewed.    ED Treatments / Results  Labs (all labs ordered are listed, but only abnormal results are displayed) Labs Reviewed  COMPREHENSIVE METABOLIC PANEL - Abnormal; Notable for the following:       Result Value   CO2 20 (*)    Glucose, Bld 134 (*)    BUN 24 (*)    Creatinine, Ser 1.47 (*)    Calcium 8.1 (*)    Total Protein 5.8 (*)    Albumin 3.1 (*)    ALT 16 (*)    GFR calc  non Af Amer 40 (*)    GFR calc Af Amer 46 (*)    All other components within normal limits  CBC - Abnormal; Notable for the following:    WBC 17.3 (*)    RBC 2.97 (*)    Hemoglobin 9.1 (*)    HCT 27.7 (*)    All other components within normal limits  POC OCCULT BLOOD, ED - Abnormal; Notable for the following:    Fecal Occult Bld POSITIVE (*)    All other components within normal limits  PROTIME-INR  I-STAT CG4 LACTIC ACID, ED  TYPE AND SCREEN  ABO/RH    EKG  EKG Interpretation None       Radiology Ct Abdomen Pelvis W Contrast  Result Date: 06/12/2017 CLINICAL DATA:  Rectal bleeding since Monday.  Hemoglobin 9.0. EXAM: CT ABDOMEN AND PELVIS WITH CONTRAST TECHNIQUE: Multidetector CT imaging of the abdomen and pelvis was performed using the standard protocol following bolus administration of intravenous contrast. CONTRAST:  158mL ISOVUE-300 IOPAMIDOL (ISOVUE-300) INJECTION 61% COMPARISON:  CT abdomen dated 06/26/2013. FINDINGS: Lower chest: No acute abnormality. Hepatobiliary: No focal liver abnormality is seen. Status post cholecystectomy. No biliary dilatation. Pancreas: Unremarkable. No  pancreatic ductal dilatation or surrounding inflammatory changes. Spleen: Normal in size without focal abnormality. Adrenals/Urinary Tract: Adrenal glands appear normal. Bilateral renal cysts. Small bilateral nonobstructing renal stones. No ureteral or bladder calculi. Stomach/Bowel: Extensive diverticulosis throughout the upper sigmoid colon and descending colon. Additional scattered diverticulosis throughout the remainder of the colon. Thickening of the walls of the lower descending colon with surrounding pericolonic inflammation/stranding indicating acute diverticulitis. No dilated large or small bowel loops. Stomach is unremarkable. Appendix is not seen but there are no inflammatory changes about the cecum to suggest acute appendicitis. Vascular/Lymphatic: Aortic atherosclerosis. No enlarged abdominal or pelvic lymph nodes. Reproductive: Prostate is unremarkable. Other: No abscess collection seen. No free intraperitoneal air seen. Musculoskeletal: Degenerative change within the lower lumbar spine, mild to moderate in degree. No acute or suspicious osseous finding. IMPRESSION: 1. Acute diverticulitis within the lower descending colon, uncomplicated. No abscess collection or free intraperitoneal air. No associated bowel obstruction. 2. Extensive colonic diverticulosis. 3. Aortic atherosclerosis. 4. Additional chronic/incidental findings detailed above. Electronically Signed   By: Franki Cabot M.D.   On: 06/12/2017 21:47    Procedures Procedures (including critical care time)  Medications Ordered in ED Medications  levETIRAcetam (KEPPRA) 1,000 mg in sodium chloride 0.9 % 100 mL IVPB (not administered)  ciprofloxacin (CIPRO) IVPB 500 mg (not administered)  metroNIDAZOLE (FLAGYL) IVPB 500 mg (not administered)  famotidine (PEPCID) IVPB 20 mg premix (20 mg Intravenous New Bag/Given 06/12/17 2048)  pantoprazole (PROTONIX) injection 40 mg (40 mg Intravenous Given 06/12/17 2046)  sodium chloride 0.9 % bolus  1,000 mL (1,000 mLs Intravenous New Bag/Given 06/12/17 2046)  iopamidol (ISOVUE-300) 61 % injection (100 mLs  Contrast Given 06/12/17 2121)     Initial Impression / Assessment and Plan / ED Course  I have reviewed the triage vital signs and the nursing notes.  Pertinent labs & imaging results that were available during my care of the patient were reviewed by me and considered in my medical decision making (see chart for details).  Clinical Course as of Jun 13 2207  Thu Jun 12, 2017  2027 WBC: (!) 17.3 [CG]  2027 Hemoglobin: (!) 9.1 [CG]  2027 HCT: (!) 27.7 [CG]  2027 Creatinine: (!) 1.47 [CG]  2027 BUN: (!) 24 [CG]  2027 GFR, Est Non African American: (!) 40 [  CG]  2027 BP: 105/69 [CG]  2027 Pulse Rate: 73 [CG]  2027 Temp: 98.5 F (36.9 C) [CG]  2153 IMPRESSION: 1. Acute diverticulitis within the lower descending colon, uncomplicated. No abscess collection or free intraperitoneal air. No associated bowel obstruction. 2. Extensive colonic diverticulosis. 3. Aortic atherosclerosis. 4. Additional chronic/incidental findings detailed above. CT Abdomen Pelvis W Contrast [CG]    Clinical Course User Index [CG] Kinnie Feil, PA-C   83 year old presents to ED with hematochezia 3 days. Associated with decreased appetite, nausea and diffuse lower abdominal pain. Has history of diverticulitis and cholecystectomy. Has been traveling and eating at restaurants in the last 3 days. No h/o GI bleed, PUD, heavy EtOH or NSAID use, atrial fibrillation, anemia. On plavix. Was sent to ED from PCP office for elevated WBC and Hgb of 9 earlier today.   On exam, he is non toxic. VS WNL. He is not orthostatic. MMM. CP exam benign. Abdomen is tender LLQ>RLL with subjective guarding. +Guaic, non tender non thrombosed external and internal hemorrhoids on DRE.   ED lab work remarkable for leukocytosis 17.3 and anemia 9.1. He does not appear to be symptomatic of anemia however we do not have baseline hgb and  PCP sent him to ED for drop in Hgb, assuming drop has been significant. CMP with elevated creatinine/BUN, 1.47/24 in setting of h/o CKD. Will start IVF, pepcid, protonix and order CT A/P. Considering diverticular bleed vs diverticulitis vs AVM vs ischemic vs infectious diarrhea.    Final Clinical Impressions(s) / ED Diagnoses   2200: CT A/P showed uncomplicated diverticulitis w/o abscess or obstruction, extensive diverticulosis. Will start cipro/flagyl. Given drop in Hgb, active GI bleed, elevated WBC in a 83 year old will request admission.   Patient, ED treatment and discharge plan was discussed with supervising physician who is agreeable with plan.   Final diagnoses:  Diverticulitis    New Prescriptions New Prescriptions   No medications on file     Arlean Hopping 06/12/17 2208    Gareth Morgan, MD 06/19/17 1309

## 2017-06-13 DIAGNOSIS — R739 Hyperglycemia, unspecified: Secondary | ICD-10-CM | POA: Diagnosis present

## 2017-06-13 DIAGNOSIS — N183 Chronic kidney disease, stage 3 unspecified: Secondary | ICD-10-CM | POA: Diagnosis present

## 2017-06-13 DIAGNOSIS — K921 Melena: Secondary | ICD-10-CM

## 2017-06-13 DIAGNOSIS — K5792 Diverticulitis of intestine, part unspecified, without perforation or abscess without bleeding: Secondary | ICD-10-CM | POA: Diagnosis present

## 2017-06-13 LAB — BASIC METABOLIC PANEL
ANION GAP: 4 — AB (ref 5–15)
BUN: 21 mg/dL — AB (ref 6–20)
CHLORIDE: 109 mmol/L (ref 101–111)
CO2: 24 mmol/L (ref 22–32)
Calcium: 7.7 mg/dL — ABNORMAL LOW (ref 8.9–10.3)
Creatinine, Ser: 1.29 mg/dL — ABNORMAL HIGH (ref 0.61–1.24)
GFR calc Af Amer: 54 mL/min — ABNORMAL LOW (ref >60)
GFR, EST NON AFRICAN AMERICAN: 47 mL/min — AB (ref >60)
GLUCOSE: 116 mg/dL — AB (ref 65–99)
POTASSIUM: 4 mmol/L (ref 3.5–5.1)
Sodium: 137 mmol/L (ref 135–145)

## 2017-06-13 LAB — CBC
HCT: 23.1 % — ABNORMAL LOW (ref 39.0–52.0)
HCT: 22.8 % — ABNORMAL LOW (ref 39.0–52.0)
HEMOGLOBIN: 7.8 g/dL — AB (ref 13.0–17.0)
Hemoglobin: 7.9 g/dL — ABNORMAL LOW (ref 13.0–17.0)
MCH: 31.2 pg (ref 26.0–34.0)
MCH: 31.5 pg (ref 26.0–34.0)
MCHC: 33.8 g/dL (ref 30.0–36.0)
MCHC: 34.6 g/dL (ref 30.0–36.0)
MCV: 92.4 fL (ref 78.0–100.0)
MCV: 90.8 fL (ref 78.0–100.0)
PLATELETS: 231 10*3/uL (ref 150–400)
Platelets: 222 10*3/uL (ref 150–400)
RBC: 2.5 MIL/uL — ABNORMAL LOW (ref 4.22–5.81)
RBC: 2.51 MIL/uL — AB (ref 4.22–5.81)
RDW: 13.8 % (ref 11.5–15.5)
RDW: 13.7 % (ref 11.5–15.5)
WBC: 11.5 10*3/uL — ABNORMAL HIGH (ref 4.0–10.5)
WBC: 11.7 10*3/uL — ABNORMAL HIGH (ref 4.0–10.5)

## 2017-06-13 MED ORDER — SIMVASTATIN 20 MG PO TABS: 20.0000 mg | ORAL_TABLET | Freq: Every day | ORAL | Status: DC

## 2017-06-13 MED ORDER — METOPROLOL TARTRATE 12.5 MG HALF TABLET
12.5000 mg | ORAL_TABLET | Freq: Two times a day (BID) | ORAL | Status: DC
  Administered 2017-06-13 – 2017-06-15 (×6): 12.5 mg via ORAL
  Filled 2017-06-13 (×6): qty 1

## 2017-06-13 MED ORDER — ASPIRIN EC 81 MG PO TBEC
81.0000 mg | DELAYED_RELEASE_TABLET | Freq: Every day | ORAL | Status: DC
  Administered 2017-06-13 – 2017-06-15 (×3): 81 mg via ORAL
  Filled 2017-06-13 (×3): qty 1

## 2017-06-13 MED ORDER — LEVETIRACETAM 500 MG PO TABS
500.0000 mg | ORAL_TABLET | Freq: Two times a day (BID) | ORAL | Status: DC
  Administered 2017-06-13 – 2017-06-15 (×5): 500 mg via ORAL
  Filled 2017-06-13 (×5): qty 1

## 2017-06-13 MED ORDER — INFLUENZA VAC SPLIT HIGH-DOSE 0.5 ML IM SUSY
0.5000 mL | PREFILLED_SYRINGE | INTRAMUSCULAR | Status: AC
  Administered 2017-06-14: 0.5 mL via INTRAMUSCULAR
  Filled 2017-06-13: qty 0.5

## 2017-06-13 MED ORDER — LACTATED RINGERS IV SOLN
INTRAVENOUS | Status: DC
  Administered 2017-06-13: 01:00:00 via INTRAVENOUS
  Administered 2017-06-14: 1 mL via INTRAVENOUS
  Administered 2017-06-15: 08:00:00 via INTRAVENOUS

## 2017-06-13 MED ORDER — CIPROFLOXACIN IN D5W 400 MG/200ML IV SOLN
400.0000 mg | Freq: Every day | INTRAVENOUS | Status: DC
  Administered 2017-06-13 – 2017-06-14 (×2): 400 mg via INTRAVENOUS
  Filled 2017-06-13 (×2): qty 200

## 2017-06-13 MED ORDER — ONDANSETRON HCL 4 MG PO TABS: 4.0000 mg | ORAL_TABLET | Freq: Four times a day (QID) | ORAL | Status: DC | PRN

## 2017-06-13 MED ORDER — ONDANSETRON HCL 4 MG/2ML IJ SOLN: 4.0000 mg | Freq: Four times a day (QID) | INTRAMUSCULAR | Status: DC | PRN

## 2017-06-13 MED ORDER — ACETAMINOPHEN 650 MG RE SUPP: 650.0000 mg | Freq: Four times a day (QID) | RECTAL | Status: DC | PRN

## 2017-06-13 MED ORDER — METRONIDAZOLE IN NACL 5-0.79 MG/ML-% IV SOLN
500.0000 mg | Freq: Three times a day (TID) | INTRAVENOUS | Status: DC
  Administered 2017-06-13 – 2017-06-14 (×4): 500 mg via INTRAVENOUS
  Filled 2017-06-13 (×6): qty 100

## 2017-06-13 MED ORDER — ACETAMINOPHEN 325 MG PO TABS
650.0000 mg | ORAL_TABLET | Freq: Four times a day (QID) | ORAL | Status: DC | PRN
  Administered 2017-06-13: 650 mg via ORAL
  Filled 2017-06-13: qty 2

## 2017-06-13 NOTE — Progress Notes (Signed)
PROGRESS NOTE  Larry Ortega  OFB:[id number] DOB: 1924/07/15 DOA: 06/12/2017 PCP: Velna Hatchet, MD  Brief Narrative: Larry Ortega is a 83 y.o. male with a history of HTN, CAD, CVA, carotid stenosis, seizure disorder, stage III CKD, and remote diverticulitis who presented to the ED with a few days of BRBPR and LLQ abdominal pain with subjective fever. Work up showed leukocytosis with acute anemia (hgb 9.1 from 12.8) and CT findings consistent with uncomplicated diverticulitis. IV antibiotics were started, IVF given, and he was admitted for management. Hgb trended downward, though bleeding has stopped.   Assessment & Plan: Principal Problem:   Diverticulitis Active Problems:   Essential hypertension   Seizures ([place])   Hematochezia   Hyperglycemia   CKD (chronic kidney disease), stage III  Diverticulitis with hematochezia:  - Continue IV antibiotics - IVF's, clears - Would not be candidate for lower GI investigation acutely.  Acute blood loss anemia: Due to hematochezia as above. Asymptomatic without hypotension/tachycardia.  - Recheck CBC this PM. Pt agrees to transfusion if hgb continues to drop, bleeding returns, or he develops symptoms.   Stage III CKD: Cr baseline not certain, but elevated and 1.47 on arrival.  - Monitor with IVF's.  - Avoid nephrotoxins  Seizure disorder: In setting of CVD/CVA's.  - Continue keppra  HTN:  - Continue beta blocker, holding CCB with GI bleed and normotension.  Hyperglycemia: May be stress response -Will follow with fasting AM labs  DVT prophylaxis: SCDs Code Status: Full Family Communication: Son at bedside Disposition Plan: PT ordered, anticipate DC to home.   Consultants:   None  Procedures:   None  Antimicrobials:  Cipro/flagyl   Subjective: Abdominal pain is only tenderness with palpation, otherwise painless. Last flare was >3 years ago at time of initial Dx. No further bleeding today. No fever.   Objective: BP  124/60 (BP Location: Right Arm)   Pulse 60   Temp 98.3 F (36.8 C) (Oral)   Resp 17   Ht 6' (1.829 m)   Wt 72.7 kg (160 lb 3.2 oz)   SpO2 97%   BMI 21.73 kg/m   Gen: Pleasant, elderly, active male in no distress Pulm: Non-labored breathing room air. Clear to auscultation bilaterally.  CV: Regular rate and rhythm. No murmur, rub, or gallop. No JVD, no pedal edema. GI: Abdomen soft, minimally tender in LLQ without rebound, non-distended, with normoactive bowel sounds. No organomegaly or masses felt. Ext: Warm, no deformities Skin: No rashes, lesions no ulcers Neuro: Alert and oriented. No focal neurological deficits. Psych: Judgement and insight appear normal. Mood & affect appropriate.   Data Reviewed: I have personally reviewed following labs and imaging studies  CBC:  Recent Labs Lab 06/12/17 1344 06/13/17 0712  WBC 17.3* 11.5*  HGB 9.1* 7.8*  HCT 27.7* 23.1*  MCV 93.3 92.4  PLT 253 782   Basic Metabolic Panel:  Recent Labs Lab 06/12/17 1344 06/13/17 0712  NA 136 137  K 4.1 4.0  CL 106 109  CO2 20* 24  GLUCOSE 134* 116*  BUN 24* 21*  CREATININE 1.47* 1.29*  CALCIUM 8.1* 7.7*   GFR: Estimated Creatinine Clearance: 38.4 mL/min (A) (by C-G formula based on SCr of 1.29 mg/dL (H)). Liver Function Tests:  Recent Labs Lab 06/12/17 1344  AST 17  ALT 16*  ALKPHOS 49  BILITOT 0.7  PROT 5.8*  ALBUMIN 3.1*   No results for input(s): LIPASE, AMYLASE in the last 168 hours. No results for input(s): AMMONIA in the last 168  hours. Coagulation Profile:  Recent Labs Lab 06/12/17 2035  INR 1.05   Cardiac Enzymes: No results for input(s): CKTOTAL, CKMB, CKMBINDEX, TROPONINI in the last 168 hours. BNP (last 3 results) No results for input(s): PROBNP in the last 8760 hours. HbA1C: No results for input(s): HGBA1C in the last 72 hours. CBG: No results for input(s): GLUCAP in the last 168 hours. Lipid Profile: No results for input(s): CHOL, HDL, LDLCALC, TRIG,  CHOLHDL, LDLDIRECT in the last 72 hours. Thyroid Function Tests: No results for input(s): TSH, T4TOTAL, FREET4, T3FREE, THYROIDAB in the last 72 hours. Anemia Panel: No results for input(s): VITAMINB12, FOLATE, FERRITIN, TIBC, IRON, RETICCTPCT in the last 72 hours. Urine analysis:    Component Value Date/Time   COLORURINE YELLOW 05/01/2016 2035   APPEARANCEUR CLEAR 05/01/2016 2035   LABSPEC 1.022 05/01/2016 2035   PHURINE 5.5 05/01/2016 2035   GLUCOSEU NEGATIVE 05/01/2016 2035   HGBUR NEGATIVE 05/01/2016 2035   BILIRUBINUR NEGATIVE 05/01/2016 2035   KETONESUR NEGATIVE 05/01/2016 2035   PROTEINUR NEGATIVE 05/01/2016 2035   NITRITE NEGATIVE 05/01/2016 2035   LEUKOCYTESUR NEGATIVE 05/01/2016 2035   No results found for this or any previous visit (from the past 240 hour(s)).    Radiology Studies: Ct Abdomen Pelvis W Contrast  Result Date: 06/12/2017 CLINICAL DATA:  Rectal bleeding since Monday.  Hemoglobin 9.0. EXAM: CT ABDOMEN AND PELVIS WITH CONTRAST TECHNIQUE: Multidetector CT imaging of the abdomen and pelvis was performed using the standard protocol following bolus administration of intravenous contrast. CONTRAST:  148mL ISOVUE-300 IOPAMIDOL (ISOVUE-300) INJECTION 61% COMPARISON:  CT abdomen dated 06/26/2013. FINDINGS: Lower chest: No acute abnormality. Hepatobiliary: No focal liver abnormality is seen. Status post cholecystectomy. No biliary dilatation. Pancreas: Unremarkable. No pancreatic ductal dilatation or surrounding inflammatory changes. Spleen: Normal in size without focal abnormality. Adrenals/Urinary Tract: Adrenal glands appear normal. Bilateral renal cysts. Small bilateral nonobstructing renal stones. No ureteral or bladder calculi. Stomach/Bowel: Extensive diverticulosis throughout the upper sigmoid colon and descending colon. Additional scattered diverticulosis throughout the remainder of the colon. Thickening of the walls of the lower descending colon with surrounding  pericolonic inflammation/stranding indicating acute diverticulitis. No dilated large or small bowel loops. Stomach is unremarkable. Appendix is not seen but there are no inflammatory changes about the cecum to suggest acute appendicitis. Vascular/Lymphatic: Aortic atherosclerosis. No enlarged abdominal or pelvic lymph nodes. Reproductive: Prostate is unremarkable. Other: No abscess collection seen. No free intraperitoneal air seen. Musculoskeletal: Degenerative change within the lower lumbar spine, mild to moderate in degree. No acute or suspicious osseous finding. IMPRESSION: 1. Acute diverticulitis within the lower descending colon, uncomplicated. No abscess collection or free intraperitoneal air. No associated bowel obstruction. 2. Extensive colonic diverticulosis. 3. Aortic atherosclerosis. 4. Additional chronic/incidental findings detailed above. Electronically Signed   By: Franki Cabot M.D.   On: 06/12/2017 21:47    Scheduled Meds: . aspirin EC  81 mg Oral Daily  . [START ON 06/14/2017] Influenza vac split quadrivalent PF  0.5 mL Intramuscular Tomorrow-1000  . levETIRAcetam  500 mg Oral BID  . metoprolol tartrate  12.5 mg Oral BID   Continuous Infusions: . ciprofloxacin Stopped (06/13/17 0222)  . lactated ringers 75 mL/hr at 06/13/17 0105  . metronidazole Stopped (06/13/17 1044)     LOS: 1 day   Time spent: 25 minutes.  Vance Gather, MD [facility] Pager [phone]  If 7PM-7AM, please contact night-coverage [email or web address] Password [email or web address] 06/13/2017, 12:30 PM

## 2017-06-13 NOTE — Progress Notes (Signed)
PHARMACY NOTE:  ANTIMICROBIAL RENAL DOSAGE ADJUSTMENT  Current antimicrobial regimen includes a mismatch between antimicrobial dosage and estimated renal function.  As per policy approved by the Pharmacy & Therapeutics and Medical Executive Committees, the antimicrobial dosage will be adjusted accordingly.  Current antimicrobial dosage:  Cipro 400 mg IV q12hr   Indication: Diverticulitis   Renal Function:   Estimated Creatinine Clearance: 33.7 mL/min (A) (by C-G formula based on SCr of 1.47 mg/dL (H)). []      On intermittent HD, scheduled: []      On CRRT    Antimicrobial dosage has been changed to:  Cipro 400mg  IV q24hr   Additional comments: If renal function improves, may be able to increase dose frequency. Please consult [place], if needed.   Argie Ramming, PharmD Clinical Pharmacist 06/13/17 12:51 AM

## 2017-06-14 DIAGNOSIS — R739 Hyperglycemia, unspecified: Secondary | ICD-10-CM

## 2017-06-14 LAB — BASIC METABOLIC PANEL
Anion gap: 7 (ref 5–15)
BUN: 14 mg/dL (ref 6–20)
CHLORIDE: 109 mmol/L (ref 101–111)
CO2: 23 mmol/L (ref 22–32)
CREATININE: 1.25 mg/dL — AB (ref 0.61–1.24)
Calcium: 7.9 mg/dL — ABNORMAL LOW (ref 8.9–10.3)
GFR calc Af Amer: 56 mL/min — ABNORMAL LOW (ref >60)
GFR calc non Af Amer: 49 mL/min — ABNORMAL LOW (ref >60)
GLUCOSE: 117 mg/dL — AB (ref 65–99)
POTASSIUM: 3.4 mmol/L — AB (ref 3.5–5.1)
Sodium: 139 mmol/L (ref 135–145)

## 2017-06-14 LAB — CBC
HEMATOCRIT: 24.4 % — AB (ref 39.0–52.0)
Hemoglobin: 8.2 g/dL — ABNORMAL LOW (ref 13.0–17.0)
MCH: 30.9 pg (ref 26.0–34.0)
MCHC: 33.6 g/dL (ref 30.0–36.0)
MCV: 92.1 fL (ref 78.0–100.0)
Platelets: 250 10*3/uL (ref 150–400)
RBC: 2.65 MIL/uL — ABNORMAL LOW (ref 4.22–5.81)
RDW: 13.6 % (ref 11.5–15.5)
WBC: 10 10*3/uL (ref 4.0–10.5)

## 2017-06-14 MED ORDER — CIPROFLOXACIN HCL 500 MG PO TABS
500.0000 mg | ORAL_TABLET | Freq: Two times a day (BID) | ORAL | Status: DC
  Administered 2017-06-14 – 2017-06-15 (×3): 500 mg via ORAL
  Filled 2017-06-14 (×3): qty 1

## 2017-06-14 MED ORDER — METRONIDAZOLE 500 MG PO TABS
500.0000 mg | ORAL_TABLET | Freq: Three times a day (TID) | ORAL | Status: DC
  Administered 2017-06-14 – 2017-06-15 (×3): 500 mg via ORAL
  Filled 2017-06-14 (×3): qty 1

## 2017-06-14 MED ORDER — POTASSIUM CHLORIDE CRYS ER 20 MEQ PO TBCR
40.0000 meq | EXTENDED_RELEASE_TABLET | Freq: Once | ORAL | Status: AC
  Administered 2017-06-14: 40 meq via ORAL
  Filled 2017-06-14: qty 2

## 2017-06-14 NOTE — Evaluation (Signed)
Physical Therapy Evaluation Patient Details Name: Larry Ortega MRN: [id number] DOB: 1924-05-06 Today's Date: 06/14/2017   History of Present Illness  83 y.o. male with a history of HTN, CAD, CVA, carotid stenosis, seizure disorder, stage III CKD, and remote diverticulitis. He was admitted through the ED for diverticulitis.   Clinical Impression  Pt admitted with above diagnosis. Pt currently with functional limitations due to the deficits listed below (see PT Problem List). On eval, pt required supervision bed mobility, min guard assist transfers, and min guard assist ambulation 275 feet with cane and IV pole. Pt will benefit from skilled PT to increase their independence and safety with mobility to allow discharge to the venue listed below.       Follow Up Recommendations Home health PT;Supervision/Assistance - 24 hour    Equipment Recommendations  None recommended by PT    Recommendations for Other Services       Precautions / Restrictions Precautions Precautions: Fall      Mobility  Bed Mobility Overal bed mobility: Needs Assistance Bed Mobility: Supine to Sit     Supine to sit: Supervision     General bed mobility comments: +rail, increased time and effort  Transfers Overall transfer level: Needs assistance Equipment used: Straight cane (and pushing IV pole) Transfers: Sit to/from Stand Sit to Stand: Min guard         General transfer comment: increased time to stabilize initial standing balance  Ambulation/Gait Ambulation/Gait assistance: Min guard Ambulation Distance (Feet): 275 Feet Assistive device: Straight cane Gait Pattern/deviations: Step-through pattern;Decreased stride length Gait velocity: decreased   General Gait Details: Pt utilized cane and IV pole for bilat support. Pt reports always wearing sneakers to ambulate at home. Pt in gripper socks during eval.  Stairs            Wheelchair Mobility    Modified Rankin (Stroke Patients  Only)       Balance Overall balance assessment: Needs assistance Sitting-balance support: No upper extremity supported;Feet supported Sitting balance-Leahy Scale: Good     Standing balance support: Bilateral upper extremity supported;During functional activity Standing balance-Leahy Scale: Fair Standing balance comment: bilat support needed during ambulation                             Pertinent Vitals/Pain Pain Assessment: No/denies pain    Home Living Family/patient expects to be discharged to:: Private residence Living Arrangements: Spouse/significant other Available Help at Discharge: Family;Available 24 hours/day Type of Home: House Home Access: Stairs to enter Entrance Stairs-Rails: None Entrance Stairs-Number of Steps: 1 Home Layout: Multi-level;Able to live on main level with bedroom/bathroom Home Equipment: Gilford Rile - 2 wheels;Cane - single point Additional Comments: Bedroom/bathroom pt currently uses is upstairs. There is a bedroom/bathroom pt can use on main level.    Prior Function Level of Independence: Independent         Comments: Driving, mowing yard on riding mower     Hand Dominance   Dominant Hand: Right    Extremity/Trunk Assessment   Upper Extremity Assessment Upper Extremity Assessment: Generalized weakness    Lower Extremity Assessment Lower Extremity Assessment: Generalized weakness    Cervical / Trunk Assessment Cervical / Trunk Assessment: Kyphotic  Communication   Communication: No difficulties  Cognition Arousal/Alertness: Awake/alert Behavior During Therapy: WFL for tasks assessed/performed Overall Cognitive Status: History of cognitive impairments - at baseline  General Comments      Exercises     Assessment/Plan    PT Assessment Patient needs continued PT services  PT Problem List Decreased balance;Decreased activity tolerance;Decreased  mobility;Decreased safety awareness       PT Treatment Interventions Gait training;Stair training;Functional mobility training;Therapeutic activities;Therapeutic exercise;Balance training;Patient/family education    PT Goals (Current goals can be found in the Care Plan section)  Acute Rehab PT Goals Patient Stated Goal: walk more PT Goal Formulation: With patient/family Time For Goal Achievement: 06/28/17 Potential to Achieve Goals: Good    Frequency Min 3X/week   Barriers to discharge        Co-evaluation               AM-PAC PT "6 Clicks" Daily Activity  Outcome Measure Difficulty turning over in bed (including adjusting bedclothes, sheets and blankets)?: None Difficulty moving from lying on back to sitting on the side of the bed? : A Little Difficulty sitting down on and standing up from a chair with arms (e.g., wheelchair, bedside commode, etc,.)?: A Little Help needed moving to and from a bed to chair (including a wheelchair)?: None Help needed walking in hospital room?: None Help needed climbing 3-5 steps with a railing? : A Little 6 Click Score: 21    End of Session Equipment Utilized During Treatment: Gait belt Activity Tolerance: Patient tolerated treatment well Patient left: in chair;with call bell/phone within reach;with family/visitor present Nurse Communication: Mobility status PT Visit Diagnosis: Unsteadiness on feet (R26.81);Difficulty in walking, not elsewhere classified (R26.2)    Time: 2505-3976 PT Time Calculation (min) (ACUTE ONLY): 14 min   Charges:   PT Evaluation $PT Eval Moderate Complexity: 1 Mod     PT G Codes:        Lorrin Goodell, PT  Office # [phone] Pager [phone]   Lorriane Shire 06/14/2017, 12:44 PM

## 2017-06-14 NOTE — Progress Notes (Signed)
PROGRESS NOTE    Larry Ortega  DZH:[id number] DOB: Aug 25, 1924 DOA: 06/12/2017 PCP: Velna Hatchet, MD    Brief Narrative: Larry Ortega is a 83 y.o. male with a history of HTN, CAD, CVA, carotid stenosis, seizure disorder, stage III CKD, and remote diverticulitis who presented to the ED with a few days of BRBPR and LLQ abdominal pain with subjective fever. Work up showed leukocytosis with acute anemia (hgb 9.1 from 12.8) and CT findings consistent with uncomplicated diverticulitis. IV antibiotics were started, IVF given, and he was admitted for further management  Assessment & Plan:   Principal Problem:   Diverticulitis Active Problems:   Essential hypertension   Seizures ([place])   Hematochezia   Hyperglycemia   CKD (chronic kidney disease), stage III  Acute diverticulitis with hematochezia:  Improving, no nausea, vomiting ,abd pain improved.  Resume ciprofloxacin and flagyl.  Recommend outpatient follow up with gastroenterology .  He remains afebrile and no leukocytosis.  Started on clears and advance diet as tolerated. If able to tolerate soft diet in am , can go home.    Hypokalemia: replaced.    Acute blood loss anemia: hemoglobin stabilized around 8.    Stage 3 CKD:  Creatinine appears stable.    Seizure disorder:  Resume keppra.    Hypertension:  Resume metoprolol. Well controlled.         DVT prophylaxis:SCD's Code Status: full code.  Family Communication: family at bedside.  Disposition Plan: possibly home in am if able to tolerate soft diet.   Consultants:   None.    Procedures: none.   Antimicrobials: ciprofloxacin and flagyl since admission.   Subjective: No new complaints.   Objective: Vitals:   06/13/17 0524 06/13/17 1439 06/13/17 2115 06/14/17 0436  BP: 127/62 124/60 128/67 (!) 133/55  Pulse: 70 60 64 70  Resp: 16 17 17 17   Temp: 98.5 F (36.9 C) 98.3 F (36.8 C) 98.4 F (36.9 C) 98.3 F (36.8 C)  TempSrc: Oral Oral Oral  Oral  SpO2:  97% 98% 99%  Weight:      Height:        Intake/Output Summary (Last 24 hours) at 06/14/17 1223 Last data filed at 06/14/17 0100  Gross per 24 hour  Intake          2406.25 ml  Output                0 ml  Net          2406.25 ml   Filed Weights   06/12/17 2230 06/13/17 0025  Weight: 75.8 kg (167 lb) 72.7 kg (160 lb 3.2 oz)    Examination:  General exam: Appears calm and comfortable  Respiratory system: Clear to auscultation. Respiratory effort normal. Cardiovascular system: S1 & S2 heard, RRR. No JVD, murmurs, rubs, gallops or clicks. No pedal edema. Gastrointestinal system: Abdomen is nondistended, soft and nontender. No organomegaly or masses felt. Normal bowel sounds heard. Central nervous system: Alert and oriented. No focal neurological deficits. Extremities: Symmetric 5 x 5 power. Skin: No rashes, lesions or ulcers Psychiatry: Judgement and insight appear normal. Mood & affect appropriate.     Data Reviewed: I have personally reviewed following labs and imaging studies  CBC:  Recent Labs Lab 06/12/17 1344 06/13/17 0712 06/13/17 1634 06/14/17 0644  WBC 17.3* 11.5* 11.7* 10.0  HGB 9.1* 7.8* 7.9* 8.2*  HCT 27.7* 23.1* 22.8* 24.4*  MCV 93.3 92.4 90.8 92.1  PLT 253 222 231 419   Basic Metabolic Panel:  Recent Labs Lab 06/12/17 1344 06/13/17 0712 06/14/17 0644  NA 136 137 139  K 4.1 4.0 3.4*  CL 106 109 109  CO2 20* 24 23  GLUCOSE 134* 116* 117*  BUN 24* 21* 14  CREATININE 1.47* 1.29* 1.25*  CALCIUM 8.1* 7.7* 7.9*   GFR: Estimated Creatinine Clearance: 39.6 mL/min (A) (by C-G formula based on SCr of 1.25 mg/dL (H)). Liver Function Tests:  Recent Labs Lab 06/12/17 1344  AST 17  ALT 16*  ALKPHOS 49  BILITOT 0.7  PROT 5.8*  ALBUMIN 3.1*   No results for input(s): LIPASE, AMYLASE in the last 168 hours. No results for input(s): AMMONIA in the last 168 hours. Coagulation Profile:  Recent Labs Lab 06/12/17 2035  INR 1.05    Cardiac Enzymes: No results for input(s): CKTOTAL, CKMB, CKMBINDEX, TROPONINI in the last 168 hours. BNP (last 3 results) No results for input(s): PROBNP in the last 8760 hours. HbA1C: No results for input(s): HGBA1C in the last 72 hours. CBG: No results for input(s): GLUCAP in the last 168 hours. Lipid Profile: No results for input(s): CHOL, HDL, LDLCALC, TRIG, CHOLHDL, LDLDIRECT in the last 72 hours. Thyroid Function Tests: No results for input(s): TSH, T4TOTAL, FREET4, T3FREE, THYROIDAB in the last 72 hours. Anemia Panel: No results for input(s): VITAMINB12, FOLATE, FERRITIN, TIBC, IRON, RETICCTPCT in the last 72 hours. Sepsis Labs: No results for input(s): PROCALCITON, LATICACIDVEN in the last 168 hours.  No results found for this or any previous visit (from the past 240 hour(s)).       Radiology Studies: Ct Abdomen Pelvis W Contrast  Result Date: 06/12/2017 CLINICAL DATA:  Rectal bleeding since Monday.  Hemoglobin 9.0. EXAM: CT ABDOMEN AND PELVIS WITH CONTRAST TECHNIQUE: Multidetector CT imaging of the abdomen and pelvis was performed using the standard protocol following bolus administration of intravenous contrast. CONTRAST:  180mL ISOVUE-300 IOPAMIDOL (ISOVUE-300) INJECTION 61% COMPARISON:  CT abdomen dated 06/26/2013. FINDINGS: Lower chest: No acute abnormality. Hepatobiliary: No focal liver abnormality is seen. Status post cholecystectomy. No biliary dilatation. Pancreas: Unremarkable. No pancreatic ductal dilatation or surrounding inflammatory changes. Spleen: Normal in size without focal abnormality. Adrenals/Urinary Tract: Adrenal glands appear normal. Bilateral renal cysts. Small bilateral nonobstructing renal stones. No ureteral or bladder calculi. Stomach/Bowel: Extensive diverticulosis throughout the upper sigmoid colon and descending colon. Additional scattered diverticulosis throughout the remainder of the colon. Thickening of the walls of the lower descending colon  with surrounding pericolonic inflammation/stranding indicating acute diverticulitis. No dilated large or small bowel loops. Stomach is unremarkable. Appendix is not seen but there are no inflammatory changes about the cecum to suggest acute appendicitis. Vascular/Lymphatic: Aortic atherosclerosis. No enlarged abdominal or pelvic lymph nodes. Reproductive: Prostate is unremarkable. Other: No abscess collection seen. No free intraperitoneal air seen. Musculoskeletal: Degenerative change within the lower lumbar spine, mild to moderate in degree. No acute or suspicious osseous finding. IMPRESSION: 1. Acute diverticulitis within the lower descending colon, uncomplicated. No abscess collection or free intraperitoneal air. No associated bowel obstruction. 2. Extensive colonic diverticulosis. 3. Aortic atherosclerosis. 4. Additional chronic/incidental findings detailed above. Electronically Signed   By: Franki Cabot M.D.   On: 06/12/2017 21:47        Scheduled Meds: . aspirin EC  81 mg Oral Daily  . ciprofloxacin  500 mg Oral BID  . levETIRAcetam  500 mg Oral BID  . metoprolol tartrate  12.5 mg Oral BID  . metroNIDAZOLE  500 mg Oral Q8H   Continuous Infusions: . lactated ringers 75 mL/hr  at 06/13/17 0105     LOS: 2 days    Time spent: 35 minutes.     Hosie Poisson, MD [facility] Pager [phone]  If 7PM-7AM, please contact night-coverage [email or web address] Password [facility] 06/14/2017, 12:23 PM

## 2017-06-15 DIAGNOSIS — N183 Chronic kidney disease, stage 3 (moderate): Secondary | ICD-10-CM

## 2017-06-15 DIAGNOSIS — R569 Unspecified convulsions: Secondary | ICD-10-CM

## 2017-06-15 DIAGNOSIS — I10 Essential (primary) hypertension: Secondary | ICD-10-CM

## 2017-06-15 DIAGNOSIS — K5792 Diverticulitis of intestine, part unspecified, without perforation or abscess without bleeding: Secondary | ICD-10-CM

## 2017-06-15 MED ORDER — CIPROFLOXACIN HCL 500 MG PO TABS
500.0000 mg | ORAL_TABLET | Freq: Two times a day (BID) | ORAL | 0 refills | Status: AC
Start: 2017-06-15 — End: 2017-06-20

## 2017-06-15 MED ORDER — METRONIDAZOLE 500 MG PO TABS: 500.0000 mg | ORAL_TABLET | Freq: Three times a day (TID) | ORAL | 0 refills | Status: AC

## 2017-06-15 NOTE — Discharge Summary (Signed)
Physician Discharge Summary  Biruk Troia GUR:[id number] DOB: March 30, 1924 DOA: 06/12/2017  PCP: Velna Hatchet, MD  Admit date: 06/12/2017 Discharge date: 06/15/2017  Admitted From: Home Disposition:  Home  Recommendations for Outpatient Follow-up:  1. Follow up with PCP in 1- weeks 2. Patient placed on ciprofloxacin and metronidazole for 5 more days 3. Continue dual antiplatelet therapy  Home Health: Yes Equipment/Devices: None  Discharge Condition: Stable CODE STATUS: Full  Diet recommendation: Heart healthy diet  Brief/Interim Summary: 83 year old male who presented with hematochezia. Patient does have a significant past medical history for seizures, hypertension, chronic kidney disease, coronary artery disease, and carotid artery stenosis. Patient develop bright red per rectum for last 3 days prior to hospitalization, denied any pain, nausea or vomiting, no diarrhea. Positive generalized weakness but no dyspnea or chest pain. On initial physical examination, blood pressure 118/63, heart rate 75, oxygen saturation 98%, moist mucous membranes, lungs were clear to auscultation bilaterally no wheezing, rales or rhonchi, heart S1-S2 present and rhythmic, no gallops, rubs or murmurs, no lower extremity edema, the abdomen was soft, tender to deep palpation at the left lower quadrant, and right lower quadrant, no rebound, no guarding, no lower extremity edema. Sodium 136, potassium 4.1, chloride 106, bicarbonate 20, glucose 134, PO2 24, creatinine 1.47 calcium 8.1, white count 17.3, hemoglobin 9.1, hematocrit 27.7, platelets 253. Fecal occult blood positive. CT of the abdomen with acute diverticulitis within the lower descending colon, no abscess collection or free intraperitoneal air, positive extensive colonic diverticulosis.   Patient was admitted to the hospital with the working diagnosis of diverticulitis, complicated by hematochezia.   1. Diverticulitis complicated by lower GI bleed.  Patient was admitted to the medical ward, he was placed on intravenous fluids, intravenous antibiotics, as needed antiemetics and analgesics. His diet was advanced progressively with good toleration. His hemoglobin and hematocrit remained stable, no prbc transfusion was indicated. His discharge hemoglobin is 8.2, hematocrit 24.4, no further hematochezia. Risk versus benefit dual antiplatelet therapy will be resumed. Patient was seen by physical therapy with recommendations for home health/ PT.   2. Chronic kidney disease, class III with a calculated GFR of 43 based on a creatinine 1.3. Patient received IV fluids, is kidney function remained stable, discharge creatinine 1.29, with potassium 4.0 and serum bicarbonate of 24. By the time of discharge patient is tolerating by mouth diet adequately.   3. Seizures. No seizure activity noted, patient was continued on Keppra.  4. Hypertension. Blood pressure remained stable, systolic 283 to 151, patient will resume metoprolol and amlodipine at discharge.Will need close follow-up as an outpatient.  5. Dyslipidemia.  Patient will resume simvastatin at discharge.  Discharge Diagnoses:  Principal Problem:   Diverticulitis Active Problems:   Essential hypertension   Seizures ([place])   Hematochezia   Hyperglycemia   CKD (chronic kidney disease), stage III    Discharge Instructions   Allergies as of 06/15/2017   No Known Allergies     Medication List    TAKE these medications   acetaminophen 325 MG tablet Commonly known as:  TYLENOL Take 1-2 tablets (325-650 mg total) by mouth every 6 (six) hours as needed for mild pain.   amLODipine 5 MG tablet Commonly known as:  NORVASC Take 1 tablet by mouth daily.   aspirin 81 MG tablet Take 81 mg by mouth daily.   B COMPLEX PO Take 1 tablet by mouth daily.   CALCIUM PO Take 600 mg by mouth daily.   CINNAMON PO Take 1,000 mg by  mouth daily.   ciprofloxacin 500 MG tablet Commonly known as:   CIPRO Take 1 tablet (500 mg total) by mouth 2 (two) times daily.   clopidogrel 75 MG tablet Commonly known as:  PLAVIX Take 75 mg by mouth daily.   FISH OIL PO Take 1,200 mg by mouth daily.   levETIRAcetam 500 MG tablet Commonly known as:  KEPPRA Take 1 tablet (500 mg total) by mouth 2 (two) times daily.   MAGNESIUM PO Take 1 tablet by mouth daily.   metoprolol tartrate 25 MG tablet Commonly known as:  LOPRESSOR Take 0.5 tablets (12.5 mg total) by mouth 2 (two) times daily.   metroNIDAZOLE 500 MG tablet Commonly known as:  FLAGYL Take 1 tablet (500 mg total) by mouth every 8 (eight) hours.   simvastatin 20 MG tablet Commonly known as:  ZOCOR Take 1 tablet (20 mg total) by mouth daily at 6 PM.   Vitamin D3 2000 units Tabs Take 2,000 Units by mouth daily.            Discharge Care Instructions        Start     Ordered   06/15/17 0000  ciprofloxacin (CIPRO) 500 MG tablet  2 times daily     06/15/17 1259   06/15/17 0000  metroNIDAZOLE (FLAGYL) 500 MG tablet  Every 8 hours     06/15/17 1259   06/15/17 0000  Increase activity slowly     06/15/17 1259   06/15/17 0000  Diet - low sodium heart healthy     06/15/17 1259   06/15/17 0000  Discharge instructions    Comments:  Please follow with primary care in 7 days.   06/15/17 1259      No Known Allergies  Consultations:     Procedures/Studies: Ct Abdomen Pelvis W Contrast  Result Date: 06/12/2017 CLINICAL DATA:  Rectal bleeding since Monday.  Hemoglobin 9.0. EXAM: CT ABDOMEN AND PELVIS WITH CONTRAST TECHNIQUE: Multidetector CT imaging of the abdomen and pelvis was performed using the standard protocol following bolus administration of intravenous contrast. CONTRAST:  137mL ISOVUE-300 IOPAMIDOL (ISOVUE-300) INJECTION 61% COMPARISON:  CT abdomen dated 06/26/2013. FINDINGS: Lower chest: No acute abnormality. Hepatobiliary: No focal liver abnormality is seen. Status post cholecystectomy. No biliary dilatation.  Pancreas: Unremarkable. No pancreatic ductal dilatation or surrounding inflammatory changes. Spleen: Normal in size without focal abnormality. Adrenals/Urinary Tract: Adrenal glands appear normal. Bilateral renal cysts. Small bilateral nonobstructing renal stones. No ureteral or bladder calculi. Stomach/Bowel: Extensive diverticulosis throughout the upper sigmoid colon and descending colon. Additional scattered diverticulosis throughout the remainder of the colon. Thickening of the walls of the lower descending colon with surrounding pericolonic inflammation/stranding indicating acute diverticulitis. No dilated large or small bowel loops. Stomach is unremarkable. Appendix is not seen but there are no inflammatory changes about the cecum to suggest acute appendicitis. Vascular/Lymphatic: Aortic atherosclerosis. No enlarged abdominal or pelvic lymph nodes. Reproductive: Prostate is unremarkable. Other: No abscess collection seen. No free intraperitoneal air seen. Musculoskeletal: Degenerative change within the lower lumbar spine, mild to moderate in degree. No acute or suspicious osseous finding. IMPRESSION: 1. Acute diverticulitis within the lower descending colon, uncomplicated. No abscess collection or free intraperitoneal air. No associated bowel obstruction. 2. Extensive colonic diverticulosis. 3. Aortic atherosclerosis. 4. Additional chronic/incidental findings detailed above. Electronically Signed   By: Franki Cabot M.D.   On: 06/12/2017 21:47       Subjective: Patient feeling better, no further bleeding, no nausea or vomiting, no chest pain or dyspnea.  Discharge Exam: Vitals:   06/14/17 2030 06/15/17 0503  BP: (!) 165/63 (!) 128/53  Pulse: 73 63  Resp: 17 17  Temp: 98.5 F (36.9 C) 98.4 F (36.9 C)  SpO2: 100%    Vitals:   06/14/17 0436 06/14/17 1517 06/14/17 2030 06/15/17 0503  BP: (!) 133/55 (!) 123/56 (!) 165/63 (!) 128/53  Pulse: 70 63 73 63  Resp: 17 17 17 17   Temp: 98.3 F  (36.8 C) 97.9 F (36.6 C) 98.5 F (36.9 C) 98.4 F (36.9 C)  TempSrc: Oral Oral Oral Oral  SpO2: 99% 100% 100%   Weight:      Height:        General: Pt is alert, awake, not in acute distress E ENT. Mild pallor, no icterus, oral mucosa moist.  Cardiovascular: RRR, S1/S2 +, no rubs, no gallops Respiratory: CTA bilaterally, no wheezing, no rhonchi Abdominal: Soft, NT, ND, bowel sounds + Extremities: no edema, no cyanosis    The results of significant diagnostics from this hospitalization (including imaging, microbiology, ancillary and laboratory) are listed below for reference.     Microbiology: No results found for this or any previous visit (from the past 240 hour(s)).   Labs: BNP (last 3 results) No results for input(s): BNP in the last 8760 hours. Basic Metabolic Panel:  Recent Labs Lab 06/12/17 1344 06/13/17 0712 06/14/17 0644  NA 136 137 139  K 4.1 4.0 3.4*  CL 106 109 109  CO2 20* 24 23  GLUCOSE 134* 116* 117*  BUN 24* 21* 14  CREATININE 1.47* 1.29* 1.25*  CALCIUM 8.1* 7.7* 7.9*   Liver Function Tests:  Recent Labs Lab 06/12/17 1344  AST 17  ALT 16*  ALKPHOS 49  BILITOT 0.7  PROT 5.8*  ALBUMIN 3.1*   No results for input(s): LIPASE, AMYLASE in the last 168 hours. No results for input(s): AMMONIA in the last 168 hours. CBC:  Recent Labs Lab 06/12/17 1344 06/13/17 0712 06/13/17 1634 06/14/17 0644  WBC 17.3* 11.5* 11.7* 10.0  HGB 9.1* 7.8* 7.9* 8.2*  HCT 27.7* 23.1* 22.8* 24.4*  MCV 93.3 92.4 90.8 92.1  PLT 253 222 231 250   Cardiac Enzymes: No results for input(s): CKTOTAL, CKMB, CKMBINDEX, TROPONINI in the last 168 hours. BNP: Invalid input(s): POCBNP CBG: No results for input(s): GLUCAP in the last 168 hours. D-Dimer No results for input(s): DDIMER in the last 72 hours. Hgb A1c No results for input(s): HGBA1C in the last 72 hours. Lipid Profile No results for input(s): CHOL, HDL, LDLCALC, TRIG, CHOLHDL, LDLDIRECT in the last 72  hours. Thyroid function studies No results for input(s): TSH, T4TOTAL, T3FREE, THYROIDAB in the last 72 hours.  Invalid input(s): FREET3 Anemia work up No results for input(s): VITAMINB12, FOLATE, FERRITIN, TIBC, IRON, RETICCTPCT in the last 72 hours. Urinalysis    Component Value Date/Time   COLORURINE YELLOW 05/01/2016 2035   APPEARANCEUR CLEAR 05/01/2016 2035   LABSPEC 1.022 05/01/2016 2035   PHURINE 5.5 05/01/2016 2035   GLUCOSEU NEGATIVE 05/01/2016 2035   HGBUR NEGATIVE 05/01/2016 2035   BILIRUBINUR NEGATIVE 05/01/2016 2035   KETONESUR NEGATIVE 05/01/2016 2035   PROTEINUR NEGATIVE 05/01/2016 2035   NITRITE NEGATIVE 05/01/2016 2035   LEUKOCYTESUR NEGATIVE 05/01/2016 2035   Sepsis Labs Invalid input(s): PROCALCITONIN,  WBC,  LACTICIDVEN Microbiology No results found for this or any previous visit (from the past 240 hour(s)).   Time coordinating discharge: 45 minutes  SIGNED:   Tawni Millers, MD  [facility] 06/15/2017, 12:46  PM Pager [phone]  If 7PM-7AM, please contact night-coverage [email or web address] Password [email or web address]

## 2017-06-15 NOTE — Progress Notes (Signed)
Patient discharged to home with instructions. 

## 2017-06-19 DIAGNOSIS — H34811 Central retinal vein occlusion, right eye, with macular edema: Secondary | ICD-10-CM | POA: Diagnosis not present

## 2017-06-20 DIAGNOSIS — K5792 Diverticulitis of intestine, part unspecified, without perforation or abscess without bleeding: Secondary | ICD-10-CM | POA: Diagnosis not present

## 2017-07-09 IMAGING — CR DG CHEST 1V PORT
1 series · 1 of 1 positions shown · non-contrast
Comparison: None.

CLINICAL DATA: Acute respiratory failure.  History of CHF.

EXAM:
PORTABLE CHEST 1 VIEW

[AP]
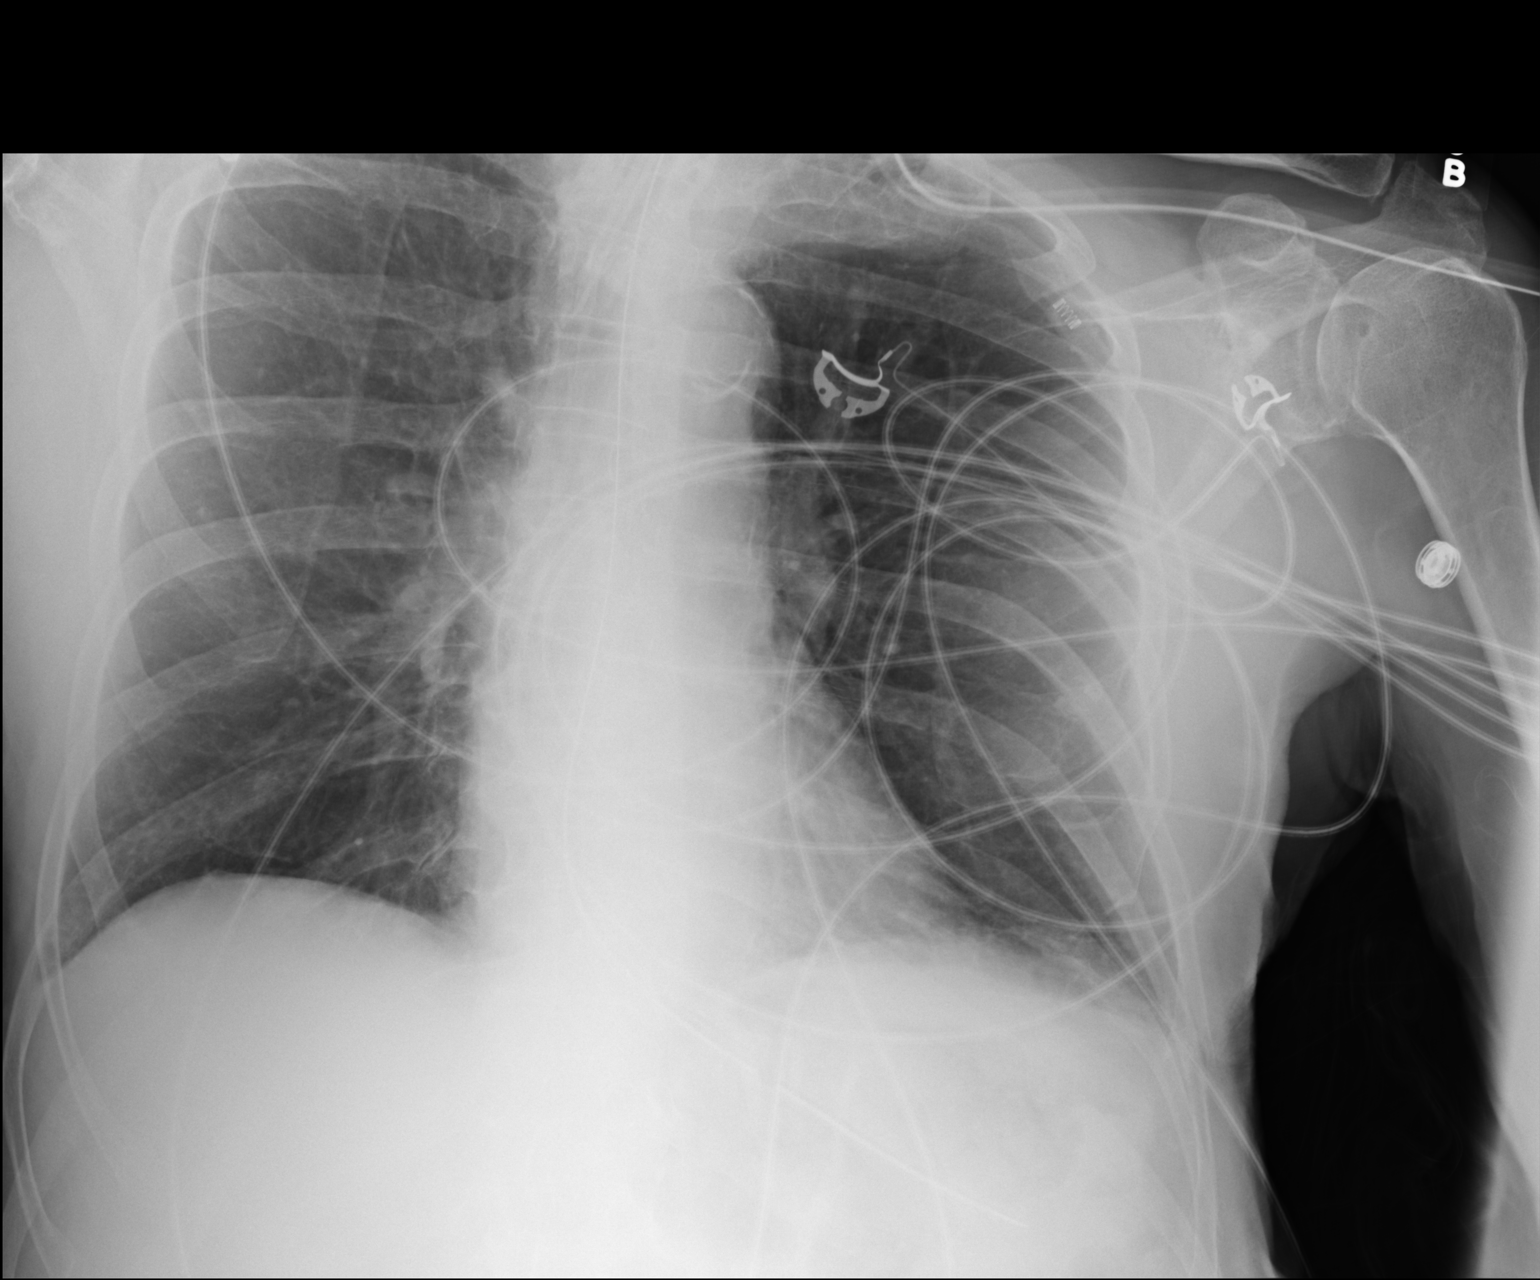

[1 of 1 positions shown; findings below may reference images not displayed]

FINDINGS: Endotracheal tube appears well positioned with tip approximately 4
cm above the carina. Enteric tube is in the stomach. Heart size is
normal. Atherosclerotic changes noted at the aortic arch.

Lungs are clear. No pleural effusion or pneumothorax seen. Osseous
structures about the chest are unremarkable.
IMPRESSION: 1. No evidence of acute cardiopulmonary abnormality. Lungs are
clear. Endotracheal tube well positioned with tip approximately 4 cm
above the carina.
2. Aortic atherosclerosis.

## 2017-07-14 DIAGNOSIS — I10 Essential (primary) hypertension: Secondary | ICD-10-CM | POA: Diagnosis not present

## 2017-07-14 DIAGNOSIS — K5792 Diverticulitis of intestine, part unspecified, without perforation or abscess without bleeding: Secondary | ICD-10-CM | POA: Diagnosis not present

## 2017-07-14 DIAGNOSIS — N183 Chronic kidney disease, stage 3 (moderate): Secondary | ICD-10-CM | POA: Diagnosis not present

## 2017-07-14 DIAGNOSIS — Z6822 Body mass index (BMI) 22.0-22.9, adult: Secondary | ICD-10-CM | POA: Diagnosis not present

## 2017-07-14 DIAGNOSIS — E7849 Other hyperlipidemia: Secondary | ICD-10-CM | POA: Diagnosis not present

## 2017-07-14 DIAGNOSIS — K5790 Diverticulosis of intestine, part unspecified, without perforation or abscess without bleeding: Secondary | ICD-10-CM | POA: Diagnosis not present

## 2017-07-14 DIAGNOSIS — M6281 Muscle weakness (generalized): Secondary | ICD-10-CM | POA: Diagnosis not present

## 2017-07-14 DIAGNOSIS — R569 Unspecified convulsions: Secondary | ICD-10-CM | POA: Diagnosis not present

## 2017-07-18 DIAGNOSIS — C61 Malignant neoplasm of prostate: Secondary | ICD-10-CM | POA: Diagnosis not present

## 2017-07-24 DIAGNOSIS — H35373 Puckering of macula, bilateral: Secondary | ICD-10-CM | POA: Diagnosis not present

## 2017-07-24 DIAGNOSIS — H348111 Central retinal vein occlusion, right eye, with retinal neovascularization: Secondary | ICD-10-CM | POA: Diagnosis not present

## 2017-08-27 ENCOUNTER — Other Ambulatory Visit: Payer: Self-pay | Admitting: Neurology

## 2017-09-06 DIAGNOSIS — Z7982 Long term (current) use of aspirin: Secondary | ICD-10-CM | POA: Diagnosis not present

## 2017-09-06 DIAGNOSIS — N281 Cyst of kidney, acquired: Secondary | ICD-10-CM | POA: Diagnosis not present

## 2017-09-06 DIAGNOSIS — Z8249 Family history of ischemic heart disease and other diseases of the circulatory system: Secondary | ICD-10-CM | POA: Diagnosis not present

## 2017-09-06 DIAGNOSIS — R109 Unspecified abdominal pain: Secondary | ICD-10-CM | POA: Diagnosis not present

## 2017-09-06 DIAGNOSIS — K5790 Diverticulosis of intestine, part unspecified, without perforation or abscess without bleeding: Secondary | ICD-10-CM | POA: Diagnosis present

## 2017-09-06 DIAGNOSIS — N189 Chronic kidney disease, unspecified: Secondary | ICD-10-CM | POA: Diagnosis present

## 2017-09-06 DIAGNOSIS — Z79899 Other long term (current) drug therapy: Secondary | ICD-10-CM | POA: Diagnosis not present

## 2017-09-06 DIAGNOSIS — N179 Acute kidney failure, unspecified: Secondary | ICD-10-CM | POA: Diagnosis not present

## 2017-09-06 DIAGNOSIS — D72829 Elevated white blood cell count, unspecified: Secondary | ICD-10-CM | POA: Diagnosis not present

## 2017-09-06 DIAGNOSIS — Z8546 Personal history of malignant neoplasm of prostate: Secondary | ICD-10-CM | POA: Diagnosis not present

## 2017-09-06 DIAGNOSIS — N132 Hydronephrosis with renal and ureteral calculous obstruction: Secondary | ICD-10-CM | POA: Diagnosis not present

## 2017-09-06 DIAGNOSIS — K5730 Diverticulosis of large intestine without perforation or abscess without bleeding: Secondary | ICD-10-CM | POA: Diagnosis not present

## 2017-09-06 DIAGNOSIS — Z87442 Personal history of urinary calculi: Secondary | ICD-10-CM | POA: Diagnosis not present

## 2017-09-06 DIAGNOSIS — I129 Hypertensive chronic kidney disease with stage 1 through stage 4 chronic kidney disease, or unspecified chronic kidney disease: Secondary | ICD-10-CM | POA: Diagnosis not present

## 2017-09-06 DIAGNOSIS — N201 Calculus of ureter: Secondary | ICD-10-CM | POA: Diagnosis not present

## 2017-09-06 DIAGNOSIS — I2510 Atherosclerotic heart disease of native coronary artery without angina pectoris: Secondary | ICD-10-CM | POA: Diagnosis present

## 2017-09-06 DIAGNOSIS — R7989 Other specified abnormal findings of blood chemistry: Secondary | ICD-10-CM | POA: Diagnosis not present

## 2017-09-06 DIAGNOSIS — J449 Chronic obstructive pulmonary disease, unspecified: Secondary | ICD-10-CM | POA: Diagnosis not present

## 2017-09-06 DIAGNOSIS — N200 Calculus of kidney: Secondary | ICD-10-CM | POA: Diagnosis not present

## 2017-09-06 DIAGNOSIS — Z7902 Long term (current) use of antithrombotics/antiplatelets: Secondary | ICD-10-CM | POA: Diagnosis not present

## 2017-09-23 DIAGNOSIS — N200 Calculus of kidney: Secondary | ICD-10-CM

## 2017-09-23 HISTORY — DX: Calculus of kidney: N20.0

## 2017-09-25 DIAGNOSIS — H34811 Central retinal vein occlusion, right eye, with macular edema: Secondary | ICD-10-CM | POA: Diagnosis not present

## 2017-09-30 DIAGNOSIS — Z6822 Body mass index (BMI) 22.0-22.9, adult: Secondary | ICD-10-CM | POA: Diagnosis not present

## 2017-09-30 DIAGNOSIS — K921 Melena: Secondary | ICD-10-CM | POA: Diagnosis not present

## 2017-09-30 DIAGNOSIS — Z87442 Personal history of urinary calculi: Secondary | ICD-10-CM | POA: Diagnosis not present

## 2017-09-30 DIAGNOSIS — R413 Other amnesia: Secondary | ICD-10-CM | POA: Diagnosis not present

## 2017-10-02 DIAGNOSIS — N281 Cyst of kidney, acquired: Secondary | ICD-10-CM | POA: Diagnosis not present

## 2017-10-02 DIAGNOSIS — Z87442 Personal history of urinary calculi: Secondary | ICD-10-CM | POA: Diagnosis not present

## 2017-10-02 DIAGNOSIS — N1330 Unspecified hydronephrosis: Secondary | ICD-10-CM | POA: Diagnosis not present

## 2017-10-02 DIAGNOSIS — N201 Calculus of ureter: Secondary | ICD-10-CM | POA: Diagnosis not present

## 2017-10-23 DIAGNOSIS — H34831 Tributary (branch) retinal vein occlusion, right eye, with macular edema: Secondary | ICD-10-CM | POA: Diagnosis not present

## 2017-11-15 DIAGNOSIS — I482 Chronic atrial fibrillation: Secondary | ICD-10-CM | POA: Diagnosis present

## 2017-11-15 DIAGNOSIS — K5791 Diverticulosis of intestine, part unspecified, without perforation or abscess with bleeding: Secondary | ICD-10-CM | POA: Diagnosis present

## 2017-11-15 DIAGNOSIS — F0390 Unspecified dementia without behavioral disturbance: Secondary | ICD-10-CM | POA: Diagnosis present

## 2017-11-15 DIAGNOSIS — I2510 Atherosclerotic heart disease of native coronary artery without angina pectoris: Secondary | ICD-10-CM | POA: Diagnosis present

## 2017-11-15 DIAGNOSIS — K5730 Diverticulosis of large intestine without perforation or abscess without bleeding: Secondary | ICD-10-CM | POA: Diagnosis not present

## 2017-11-15 DIAGNOSIS — Z923 Personal history of irradiation: Secondary | ICD-10-CM | POA: Diagnosis not present

## 2017-11-15 DIAGNOSIS — Z8546 Personal history of malignant neoplasm of prostate: Secondary | ICD-10-CM | POA: Diagnosis not present

## 2017-11-15 DIAGNOSIS — Z7982 Long term (current) use of aspirin: Secondary | ICD-10-CM | POA: Diagnosis not present

## 2017-11-15 DIAGNOSIS — Z87442 Personal history of urinary calculi: Secondary | ICD-10-CM | POA: Diagnosis not present

## 2017-11-15 DIAGNOSIS — Z7902 Long term (current) use of antithrombotics/antiplatelets: Secondary | ICD-10-CM | POA: Diagnosis not present

## 2017-11-15 DIAGNOSIS — I10 Essential (primary) hypertension: Secondary | ICD-10-CM | POA: Diagnosis present

## 2017-11-15 DIAGNOSIS — E785 Hyperlipidemia, unspecified: Secondary | ICD-10-CM | POA: Diagnosis present

## 2017-11-15 DIAGNOSIS — N200 Calculus of kidney: Secondary | ICD-10-CM | POA: Diagnosis not present

## 2017-11-15 DIAGNOSIS — K625 Hemorrhage of anus and rectum: Secondary | ICD-10-CM | POA: Diagnosis not present

## 2017-11-15 DIAGNOSIS — Z79899 Other long term (current) drug therapy: Secondary | ICD-10-CM | POA: Diagnosis not present

## 2017-11-18 ENCOUNTER — Other Ambulatory Visit: Payer: Self-pay

## 2017-11-18 ENCOUNTER — Ambulatory Visit (HOSPITAL_COMMUNITY)
Admission: RE | Admit: 2017-11-18 | Discharge: 2017-11-18 | Disposition: A | Discharge status: Home / Self Care | Payer: Medicare Other | Source: Ambulatory Visit | Attending: Vascular Surgery | Admitting: Vascular Surgery

## 2017-11-18 ENCOUNTER — Encounter: Payer: Self-pay | Admitting: Family

## 2017-11-18 ENCOUNTER — Ambulatory Visit (INDEPENDENT_AMBULATORY_CARE_PROVIDER_SITE_OTHER): Payer: Medicare Other | Admitting: Family

## 2017-11-18 VITALS — BP 143/79 | HR 60 | Temp 96.7°F | Resp 14 | Wt 171.8 lb

## 2017-11-18 DIAGNOSIS — I6521 Occlusion and stenosis of right carotid artery: Secondary | ICD-10-CM

## 2017-11-18 DIAGNOSIS — I6523 Occlusion and stenosis of bilateral carotid arteries: Secondary | ICD-10-CM | POA: Diagnosis not present

## 2017-11-18 NOTE — Patient Instructions (Signed)

## 2017-11-18 NOTE — Progress Notes (Signed)
Chief Complaint: Follow up Extracranial Carotid Artery Stenosis   History of Present Illness  Larry Ortega is a 84 y.o. male whom Dr. Donnetta Hutching has been monitoring for asymptomatic carotid disease. This was discovered with duplex about January of 2017. He is here today for follow-up. He reports no neurologic deficits. Specifically no transient ischemic attack or stroke. He does have no new cardiac disease.  Patient has not had previous carotid artery intervention.  Wife states pt had a seizure in July 2017, was told it was from stress dealing with a sick friend that soon died.   Pt has had 3 injections in his right eye, then will have laser treatment for this, wife states he received shots in the right eye due to "lack of oxygen".   He denies any claudication symptoms with walking.   Dr. Donnetta Hutching last evaluated pt on 05-13-17. At that time CT angiogram revealed 65% right carotid stenosis at the bifurcation with calcifications and no significant left carotid stenosis. This is in agreement with the last duplex Dr. Donnetta Hutching discussed the findings at length with the patient and his wife present, and recommend continued six-month surveillance with ultrasound in our office. He will notify us should he develop any focal neurologic deficits.   Diabetic: no Tobacco use: non-smoker  Pt meds include: Statin : yes ASA: no Other anticoagulants/antiplatelets: Plavix      Past Medical History:  Diagnosis Date  . Bilateral carotid artery occlusion   . CAD (coronary artery disease)    no h/o stent or bypass, followed annually  . Cancer [facility])    prostate  . Chronic kidney disease   . Hypertensive heart disease without congestive heart failure   . Lumbar spinal stenosis   . Lumbar spondylosis   . Seizure [facility]) 2017    Social History Social History   Tobacco Use  . Smoking status: Never Smoker  . Smokeless tobacco: Never Used  Substance Use Topics  . Alcohol use: No    Alcohol/week:  0.0 oz  . Drug use: No    Family History No family history on file.  Surgical History Past Surgical History:  Procedure Laterality Date  . BOWEL RESECTION    . CHOLECYSTECTOMY    . eyelid surgery      No Known Allergies  Current Outpatient Medications  Medication Sig Dispense Refill  . acetaminophen (TYLENOL) 325 MG tablet Take 1-2 tablets (325-650 mg total) by mouth every 6 (six) hours as needed for mild pain.    Marland Kitchen amLODipine (NORVASC) 5 MG tablet Take 1 tablet by mouth daily.    . B Complex Vitamins (B COMPLEX PO) Take 1 tablet by mouth daily.     Marland Kitchen CALCIUM PO Take 600 mg by mouth daily.    . Cholecalciferol (VITAMIN D3) 2000 units TABS Take 2,000 Units by mouth daily.     Marland Kitchen CINNAMON PO Take 1,000 mg by mouth daily.    . clopidogrel (PLAVIX) 75 MG tablet Take 75 mg by mouth daily.    Marland Kitchen levETIRAcetam (KEPPRA) 500 MG tablet TAKE ONE TABLET BY MOUTH TWICE DAILY 180 tablet 0  . MAGNESIUM PO Take 1 tablet by mouth daily.    . metoprolol tartrate (LOPRESSOR) 25 MG tablet Take 0.5 tablets (12.5 mg total) by mouth 2 (two) times daily.    . Omega-3 Fatty Acids (FISH OIL PO) Take 1,200 mg by mouth daily.    . simvastatin (ZOCOR) 20 MG tablet Take 1 tablet (20 mg total) by mouth daily  at 6 PM. 30 tablet   . aspirin 81 MG tablet Take 81 mg by mouth daily.     No current facility-administered medications for this visit.     Review of Systems : See HPI for pertinent positives and negatives.  Physical Examination  Vitals:   11/18/17 1052 11/18/17 1056 11/18/17 1057 11/18/17 1058  BP: (!) 150/74 (!) 142/76 137/66 (!) 143/79  Pulse: 60     Resp: 14     Temp: (!) 96.7 F (35.9 C)     TempSrc: Oral     SpO2: 100%     Weight: 171 lb 12.8 oz (77.9 kg)      Body mass index is 23.3 kg/m.  General: WDWN male in NAD GAIT: normal HENT: No gross abnormalities  Eyes: PERRLA Pulmonary:  Respirations are non-labored, good air movement in all fields, CTAB, no rales, rhonchi, or  wheezing. Cardiac: regular rhythm, no detected murmur.  VASCULAR EXAM Carotid Bruits Right Left   Negative Negative     Abdominal aortic pulse is not palpable. Radial pulses are 2+ palpable and equal.                                                                                                                                          LE Pulses Right Left       POPLITEAL  not palpable   not palpable       POSTERIOR TIBIAL   palpable    palpable        DORSALIS PEDIS      ANTERIOR TIBIAL  palpable   palpable     Gastrointestinal: soft, nontender, BS WNL, no r/g, no palpable masses. Musculoskeletal: No muscle atrophy/wasting. M/S 5/5 throughout, extremities without ischemic changes. Skin: No rashes, no ulcers, no cellulitis.   Neurologic:  A&O X 3; appropriate affect, sensation is normal; speech is normal, CN 2-12 intact, pain and light touch intact in extremities, motor exam as listed above. Psychiatric: Normal thought content, mood appropriate to clinical situation.     Assessment: Larry Ortega is a 84 y.o. male has experienced no symptoms of stroke or TIA.  At his visit on 04-08-17, I discussed with Dr. Donnetta Hutching pt HPI, physical exam results, carotid duplex results, and letter from pt ophthalmologist (wife brought) with concern re delayed arterial filling time on fluorescein angiography of right eye. CTA neck performed which demonstrated the same degree of stenosis as the previous duplex.     DATA Carotid Duplex (04/08/17): Right ICA: 40-59% stenosis. Left ICA: 1-39% stenosis Bilateral vertebral artery flow is antegrade.  Bilateral subclavian artery waveforms are normal.  No change since the exams on 04-02-16 and 04-08-17.    Plan: Follow-up in 9 months with Carotid Duplex scan.   I discussed in depth with the patient the nature of atherosclerosis, and emphasized the importance of maximal medical management including strict control of blood  pressure, blood  glucose, and lipid levels, obtaining regular exercise, and continued cessation of smoking.  The patient is aware that without maximal medical management the underlying atherosclerotic disease process will progress, limiting the benefit of any interventions. The patient was given information about stroke prevention and what symptoms should prompt the patient to seek immediate medical care. Thank you for allowing [place] to participate in this patient's care.  Clemon Chambers, RN, MSN, FNP-C [facility] Office: [phone]  Clinic Physician: Early  11/18/17 11:15 AM    CT angiogram reveals 65% right carotid stenosis at the bifurcation with calcifications and no significant left carotid stenosis. This is in agreement with this duplex  MEDICAL ISSUES: I discussed the findings and length with the patient and his wife present. I recommend continued six-month surveillance with ultrasound in our office. We will notify should he develop any focal neurologic deficits

## 2017-11-19 LAB — VAS US CAROTID
LCCADDIAS: -18 cm/s
LCCAPSYS: 91 cm/s
LEFT ECA DIAS: -12 cm/s
LEFT VERTEBRAL DIAS: 10 cm/s
LICAPSYS: 71 cm/s
Left CCA dist sys: -110 cm/s
Left CCA prox dias: 16 cm/s
Left ICA dist dias: -23 cm/s
Left ICA dist sys: -93 cm/s
Left ICA prox dias: 15 cm/s
RCCAPSYS: 76 cm/s
RIGHT CCA MID DIAS: 10 cm/s
RIGHT ECA DIAS: -15 cm/s
Right CCA prox dias: 7 cm/s

## 2017-12-02 DIAGNOSIS — M4712 Other spondylosis with myelopathy, cervical region: Secondary | ICD-10-CM | POA: Diagnosis not present

## 2017-12-03 ENCOUNTER — Other Ambulatory Visit: Payer: Self-pay

## 2017-12-03 DIAGNOSIS — I6300 Cerebral infarction due to thrombosis of unspecified precerebral artery: Secondary | ICD-10-CM

## 2017-12-03 DIAGNOSIS — I6521 Occlusion and stenosis of right carotid artery: Secondary | ICD-10-CM

## 2017-12-04 DIAGNOSIS — H34811 Central retinal vein occlusion, right eye, with macular edema: Secondary | ICD-10-CM | POA: Diagnosis not present

## 2017-12-08 DIAGNOSIS — M4802 Spinal stenosis, cervical region: Secondary | ICD-10-CM | POA: Diagnosis not present

## 2017-12-08 DIAGNOSIS — M4712 Other spondylosis with myelopathy, cervical region: Secondary | ICD-10-CM | POA: Diagnosis not present

## 2017-12-08 DIAGNOSIS — M50223 Other cervical disc displacement at C6-C7 level: Secondary | ICD-10-CM | POA: Diagnosis not present

## 2017-12-08 DIAGNOSIS — M542 Cervicalgia: Secondary | ICD-10-CM | POA: Diagnosis not present

## 2017-12-08 DIAGNOSIS — M5030 Other cervical disc degeneration, unspecified cervical region: Secondary | ICD-10-CM | POA: Diagnosis not present

## 2017-12-19 DIAGNOSIS — M4712 Other spondylosis with myelopathy, cervical region: Secondary | ICD-10-CM | POA: Diagnosis not present

## 2017-12-19 DIAGNOSIS — R270 Ataxia, unspecified: Secondary | ICD-10-CM | POA: Diagnosis not present

## 2017-12-27 ENCOUNTER — Other Ambulatory Visit: Payer: Self-pay | Admitting: Neurology

## 2017-12-30 NOTE — Telephone Encounter (Signed)
Called pt & spoke with Larry Ortega his wife (on [place]). Discussed that refill request was made for Keppra, however pt needs to be seen in office for further refills. Last seen 11/17. She verbalized understanding. She stated that she keeps up with everything so she will make his appt. Scheduled pt for Monday 02/09/18 @ 2:00 arrival time 1:30. Keppra will be refilled to get him through his appt and will be sent to [facility] in [place], [place]. Larry Ortega verbalized appreciation and understanding of appt time. She had no further questions.   E-scribed refill of Levetiracetam 500 mg PO BID, #180, refills 0 to [facility] in [place], [place].

## 2017-12-31 DIAGNOSIS — R2681 Unsteadiness on feet: Secondary | ICD-10-CM | POA: Diagnosis not present

## 2017-12-31 DIAGNOSIS — I6782 Cerebral ischemia: Secondary | ICD-10-CM | POA: Diagnosis not present

## 2017-12-31 DIAGNOSIS — Z8673 Personal history of transient ischemic attack (TIA), and cerebral infarction without residual deficits: Secondary | ICD-10-CM | POA: Diagnosis not present

## 2017-12-31 DIAGNOSIS — R270 Ataxia, unspecified: Secondary | ICD-10-CM | POA: Diagnosis not present

## 2018-01-13 DIAGNOSIS — M4712 Other spondylosis with myelopathy, cervical region: Secondary | ICD-10-CM | POA: Diagnosis not present

## 2018-01-13 DIAGNOSIS — R270 Ataxia, unspecified: Secondary | ICD-10-CM | POA: Diagnosis not present

## 2018-01-15 DIAGNOSIS — H34811 Central retinal vein occlusion, right eye, with macular edema: Secondary | ICD-10-CM | POA: Diagnosis not present

## 2018-01-15 DIAGNOSIS — H35373 Puckering of macula, bilateral: Secondary | ICD-10-CM | POA: Diagnosis not present

## 2018-01-20 DIAGNOSIS — E119 Type 2 diabetes mellitus without complications: Secondary | ICD-10-CM | POA: Diagnosis not present

## 2018-01-20 DIAGNOSIS — N183 Chronic kidney disease, stage 3 (moderate): Secondary | ICD-10-CM | POA: Diagnosis not present

## 2018-01-20 DIAGNOSIS — R82998 Other abnormal findings in urine: Secondary | ICD-10-CM | POA: Diagnosis not present

## 2018-01-20 DIAGNOSIS — E038 Other specified hypothyroidism: Secondary | ICD-10-CM | POA: Diagnosis not present

## 2018-01-20 DIAGNOSIS — E7849 Other hyperlipidemia: Secondary | ICD-10-CM | POA: Diagnosis not present

## 2018-01-23 DIAGNOSIS — Z1212 Encounter for screening for malignant neoplasm of rectum: Secondary | ICD-10-CM | POA: Diagnosis not present

## 2018-01-27 DIAGNOSIS — N183 Chronic kidney disease, stage 3 (moderate): Secondary | ICD-10-CM | POA: Diagnosis not present

## 2018-01-27 DIAGNOSIS — E1169 Type 2 diabetes mellitus with other specified complication: Secondary | ICD-10-CM | POA: Diagnosis not present

## 2018-01-27 DIAGNOSIS — Z1389 Encounter for screening for other disorder: Secondary | ICD-10-CM | POA: Diagnosis not present

## 2018-01-27 DIAGNOSIS — R808 Other proteinuria: Secondary | ICD-10-CM | POA: Diagnosis not present

## 2018-01-27 DIAGNOSIS — R413 Other amnesia: Secondary | ICD-10-CM | POA: Diagnosis not present

## 2018-01-27 DIAGNOSIS — I2510 Atherosclerotic heart disease of native coronary artery without angina pectoris: Secondary | ICD-10-CM | POA: Diagnosis not present

## 2018-01-27 DIAGNOSIS — Z0000 Encounter for general adult medical examination without abnormal findings: Secondary | ICD-10-CM | POA: Diagnosis not present

## 2018-01-27 DIAGNOSIS — I129 Hypertensive chronic kidney disease with stage 1 through stage 4 chronic kidney disease, or unspecified chronic kidney disease: Secondary | ICD-10-CM | POA: Diagnosis not present

## 2018-01-27 DIAGNOSIS — Z6823 Body mass index (BMI) 23.0-23.9, adult: Secondary | ICD-10-CM | POA: Diagnosis not present

## 2018-01-27 DIAGNOSIS — Z13820 Encounter for screening for osteoporosis: Secondary | ICD-10-CM | POA: Diagnosis not present

## 2018-01-27 DIAGNOSIS — E038 Other specified hypothyroidism: Secondary | ICD-10-CM | POA: Diagnosis not present

## 2018-01-27 DIAGNOSIS — E1121 Type 2 diabetes mellitus with diabetic nephropathy: Secondary | ICD-10-CM | POA: Diagnosis not present

## 2018-01-27 DIAGNOSIS — I10 Essential (primary) hypertension: Secondary | ICD-10-CM | POA: Diagnosis not present

## 2018-02-09 ENCOUNTER — Ambulatory Visit (INDEPENDENT_AMBULATORY_CARE_PROVIDER_SITE_OTHER): Payer: Medicare Other | Admitting: Neurology

## 2018-02-09 ENCOUNTER — Encounter: Payer: Self-pay | Admitting: Neurology

## 2018-02-09 VITALS — BP 164/70 | HR 50 | Ht 72.0 in

## 2018-02-09 DIAGNOSIS — G40109 Localization-related (focal) (partial) symptomatic epilepsy and epileptic syndromes with simple partial seizures, not intractable, without status epilepticus: Secondary | ICD-10-CM | POA: Diagnosis not present

## 2018-02-09 DIAGNOSIS — I6521 Occlusion and stenosis of right carotid artery: Secondary | ICD-10-CM | POA: Diagnosis not present

## 2018-02-09 DIAGNOSIS — R413 Other amnesia: Secondary | ICD-10-CM | POA: Diagnosis not present

## 2018-02-09 DIAGNOSIS — G40209 Localization-related (focal) (partial) symptomatic epilepsy and epileptic syndromes with complex partial seizures, not intractable, without status epilepticus: Secondary | ICD-10-CM

## 2018-02-09 MED ORDER — DONEPEZIL HCL 5 MG PO TABS: 5.0000 mg | ORAL_TABLET | Freq: Every day | ORAL | 12 refills | Status: DC

## 2018-02-09 MED ORDER — LEVETIRACETAM 500 MG PO TABS: 500.0000 mg | ORAL_TABLET | Freq: Two times a day (BID) | ORAL | 4 refills | Status: DC

## 2018-02-09 NOTE — Patient Instructions (Signed)
Donepezil tablets What is this medicine? DONEPEZIL (doe NEP e zil) is used to treat mild to moderate dementia caused by Alzheimer's disease. This medicine may be used for other purposes; ask your health care provider or pharmacist if you have questions. COMMON BRAND NAME(S): Aricept What should I tell my health care provider before I take this medicine? They need to know if you have any of these conditions: -asthma or other lung disease -difficulty passing urine -head injury -heart disease -history of irregular heartbeat -liver disease -seizures (convulsions) -stomach or intestinal disease, ulcers or stomach bleeding -an unusual or allergic reaction to donepezil, other medicines, foods, dyes, or preservatives -pregnant or trying to get pregnant -breast-feeding How should I use this medicine? Take this medicine by mouth with a glass of water. Follow the directions on the prescription label. You may take this medicine with or without food. Take this medicine at regular intervals. This medicine is usually taken before bedtime. Do not take it more often than directed. Continue to take your medicine even if you feel better. Do not stop taking except on your doctor's advice. If you are taking the 23 mg donepezil tablet, swallow it whole; do not cut, crush, or chew it. Talk to your pediatrician regarding the use of this medicine in children. Special care may be needed. Overdosage: If you think you have taken too much of this medicine contact a poison control center or emergency room at once. NOTE: This medicine is only for you. Do not share this medicine with others. What if I miss a dose? If you miss a dose, take it as soon as you can. If it is almost time for your next dose, take only that dose, do not take double or extra doses. What may interact with this medicine? Do not take this medicine with any of the following medications: -certain medicines for fungal infections like itraconazole,  fluconazole, posaconazole, and voriconazole -cisapride -dextromethorphan; quinidine -dofetilide -dronedarone -pimozide -quinidine -thioridazine -ziprasidone This medicine may also interact with the following medications: -antihistamines for allergy, cough and cold -atropine -bethanechol -carbamazepine -certain medicines for bladder problems like oxybutynin, tolterodine -certain medicines for Parkinson's disease like benztropine, trihexyphenidyl -certain medicines for stomach problems like dicyclomine, hyoscyamine -certain medicines for travel sickness like scopolamine -dexamethasone -ipratropium -NSAIDs, medicines for pain and inflammation, like ibuprofen or naproxen -other medicines for Alzheimer's disease -other medicines that prolong the QT interval (cause an abnormal heart rhythm) -phenobarbital -phenytoin -rifampin, rifabutin or rifapentine This list may not describe all possible interactions. Give your health care provider a list of all the medicines, herbs, non-prescription drugs, or dietary supplements you use. Also tell them if you smoke, drink alcohol, or use illegal drugs. Some items may interact with your medicine. What should I watch for while using this medicine? Visit your doctor or health care professional for regular checks on your progress. Check with your doctor or health care professional if your symptoms do not get better or if they get worse. You may get drowsy or dizzy. Do not drive, use machinery, or do anything that needs mental alertness until you know how this drug affects you. What side effects may I notice from receiving this medicine? Side effects that you should report to your doctor or health care professional as soon as possible: -allergic reactions like skin rash, itching or hives, swelling of the face, lips, or tongue -feeling faint or lightheaded, falls -loss of bladder control -seizures -signs and symptoms of a dangerous change in heartbeat or  heart   rhythm like chest pain; dizziness; fast or irregular heartbeat; palpitations; feeling faint or lightheaded, falls; breathing problems -signs and symptoms of infection like fever or chills; cough; sore throat; pain or trouble passing urine -signs and symptoms of liver injury like dark yellow or brown urine; general ill feeling or flu-like symptoms; light-colored stools; loss of appetite; nausea; right upper belly pain; unusually weak or tired; yellowing of the eyes or skin -slow heartbeat or palpitations -unusual bleeding or bruising -vomiting Side effects that usually do not require medical attention (report to your doctor or health care professional if they continue or are bothersome): -diarrhea, especially when starting treatment -headache -loss of appetite -muscle cramps -nausea -stomach upset This list may not describe all possible side effects. Call your doctor for medical advice about side effects. You may report side effects to FDA at 1-800-FDA-1088. Where should I keep my medicine? Keep out of reach of children. Store at room temperature between 15 and 30 degrees C (59 and 86 degrees F). Throw away any unused medicine after the expiration date. NOTE: This sheet is a summary. It may not cover all possible information. If you have questions about this medicine, talk to your doctor, pharmacist, or health care provider.  2018 [employer]vier/Gold Standard (2016-02-26 21:00:42)  

## 2018-02-09 NOTE — Progress Notes (Signed)
[facility]    Provider:  Dr Jaynee Eagles Referring Provider: Velna Hatchet, MD Primary Care Physician:  Velna Hatchet, MD  CC:  Seizures and new issue of memory loss  Interval history 02/09/2018: Here with his wife. At last appointment mentioned that pcp can refill but they are here to refill the medication and discuss something new. His short-term memory is worsening. He can do math but can't remember the day of the week, doesn't pay attention to the year and month. He pays the billsbut his wife double checks the bills.  He can remember most birthdays, not repeating the same stories. Mostly its days he can't remember. He drives to close places. Not getting lost. No family history of dementia, father was forgetful but he died very young and wife doesn't remember his father having problems. They take b12. They are active and still social. No personality changes, no hallucinations or delusions.   Interval history 08/13/2016:   He is doing very well. No events, no side effects from the [place]. Discussed seizures. Discussed he should likely be on Keppra for life. Risks of seizures and reocurrence. Discussed seizure precautions. If he is doing well can follow up with his pcp who can prescribe Keppra. They do not wish to consider stopping the Keppra in the future, they prefer to just stay on it.   HPI:  Larry Ortega is a 84 y.o. male here as a referral from Dr. Ardeth Perfect for seizure. Past medical history of chronic kidney disease, lumbar stenosis. Patient was hospitalized at [facility] after onset of altered mentation and a witnessed seizure in the emergency room. He was discharged on Keppra 500 mg twice a day. Since he left the hospital no further episodes. Here with his wife of over 66 years who also provides information regarding the episodes. No personal or family history of seizures. He has improved. He feels great. He feels some weakness in the legs since hospitalization and he is in  physical therapy. No further episodes of altered mentation or seizures. No family history or personal history of seizures. Previous to the episode, He was under some stress, a friend was sick and dying at the time. The friend died and they were executors and dealing with the state and they were working in her house and getting things in order. Wife says he had an episode of staring and unresponsiveness while in their friend's apartment, they called 911, he doesn;t remember much of it. He remembers possibly getting in the ambulance, He didn't want to get into the ambulance. He just didn;t answer, he was sitting on the porch and not answering, no facial droop, he got into the car and holding on the steering wheel and didn;t move. No facial droop or other focal weakness and per wife he looked alright except for the altered awareness. He yelled and had a convulsive seizure which was witnessed in the emergency room. Since being discharged no altered awareness or any other issues. He feels his short-term memory is impaired since leaving the hospital, attributes it to possibly Keppra.   Reviewed notes, labs and imaging from outside physicians, which showed:  Personally reviewed MRI images and agree with the following: 1. No acute intracranial abnormality identified. 2. Moderate chronic small vessel ischemic disease. 3. 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 4. Motion degraded head MRA without evidence of large vessel occlusion or flow limiting proximal stenosis  Most recent BMP showed normal sodium, potassium 3.4, normal chloride and CO2, elevated glucose 119, BUN  25, creatinine 1.17, GFR mildly decreased at 53 mL/min. CBC was unremarkable except mild anemia hemoglobin 12.8. LP showed elevated HSV antibody IgG but negative PCR. Protein was slightly elevated at 66, white blood cells were normal. Serum TSH was normal, serum vitamin B12 341 and HIV and RPR were negative.  Patient admitted  04/20/2016 with confusion, dizziness and reported facial droop. CT of the head was negative for acute changes. Initially thought to have left MCA infarct did not receive TPA. Patient developed seizure activity 2 on the ED and was loaded with Keppra and intubated for airway protection. LP was unremarkable. MRI showed chronic right frontal and right cerebellar infarcts with no acute abnormality. EEG with nonspecific diffuse cerebral dysfunction but no focal epileptiform activity. 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 Patient was discharged to rehabilitation.  Review of Systems: Patient complains of symptoms per HPI as well as the following symptoms: no CP or SOB. Pertinent negatives per HPI. All others negative.   Social History   Socioeconomic History  . Marital status: Married    Spouse name: Not on file  . Number of children: 2  . Years of education: Not on file  . Highest education level: Bachelor's degree (e.g., BA, AB, BS)  Occupational History  . Occupation: retired  Scientific laboratory technician  . Financial resource strain: Not on file  . Food insecurity:    Worry: Not on file    Inability: Not on file  . Transportation needs:    Medical: Not on file    Non-medical: Not on file  Tobacco Use  . Smoking status: Never Smoker  . Smokeless tobacco: Never Used  Substance and Sexual Activity  . Alcohol use: No    Alcohol/week: 0.0 oz  . Drug use: No  . Sexual activity: Not on file  Lifestyle  . Physical activity:    Days per week: Not on file    Minutes per session: Not on file  . Stress: Not on file  Relationships  . Social connections:    Talks on phone: Not on file    Gets together: Not on file    Attends religious service: Not on file    Active member of club or organization: Not on file    Attends meetings of clubs or organizations: Not on file    Relationship status: Not on file  . Intimate partner violence:    Fear of current or ex partner: Not on file     Emotionally abused: Not on file    Physically abused: Not on file    Forced sexual activity: Not on file  Other Topics Concern  . Not on file  Social History Narrative   No alcohol or drug use   Right handed   Lives at home with his wife   Caffeine:4 cups daily (coffee x 2 and tea x 2)       History reviewed. No pertinent family history.  Past Medical History:  Diagnosis Date  . Bilateral carotid artery occlusion   . CAD (coronary artery disease)    no h/o stent or bypass, followed annually  . Cancer [facility])    prostate  . Chronic kidney disease   . Hypertensive heart disease without congestive heart failure   . Lumbar spinal stenosis   . Lumbar spondylosis   . Seizure ([place]) 2017    Past Surgical History:  Procedure Laterality Date  . BACK SURGERY    . BOWEL RESECTION    . CHOLECYSTECTOMY    .  eye injections for fluid behind the R eye Right 2018-2019  . eyelid surgery      Current Outpatient Medications  Medication Sig Dispense Refill  . acetaminophen (TYLENOL) 325 MG tablet Take 1-2 tablets (325-650 mg total) by mouth every 6 (six) hours as needed for mild pain.    Marland Kitchen amLODipine (NORVASC) 10 MG tablet Take 10 mg by mouth daily.    . B Complex Vitamins (B COMPLEX PO) Take 1 tablet by mouth daily.     Marland Kitchen CALCIUM PO Take 600 mg by mouth daily.    . Cholecalciferol (VITAMIN D3) 2000 units TABS Take 2,000 Units by mouth daily.     . clopidogrel (PLAVIX) 75 MG tablet Take 75 mg by mouth daily.    Marland Kitchen levETIRAcetam (KEPPRA) 500 MG tablet Take 1 tablet (500 mg total) by mouth 2 (two) times daily. Please keep appointment for further refills. 180 tablet 0  . levothyroxine (SYNTHROID, LEVOTHROID) 50 MCG tablet Take 50 mcg by mouth daily. After taking Levothyroxine wait 30 minutes before taking other medications    . lisinopril (PRINIVIL,ZESTRIL) 10 MG tablet Take 10 mg by mouth daily.  3  . Omega-3 Fatty Acids (FISH OIL PO) Take 1,200 mg by mouth daily.    . simvastatin (ZOCOR) 20  MG tablet Take 1 tablet (20 mg total) by mouth daily at 6 PM. 30 tablet    No current facility-administered medications for this visit.     Allergies as of 02/09/2018  . (No Known Allergies)    Vitals: BP (!) 164/70 (BP Location: Right Arm, Patient Position: Sitting)   Pulse (!) 50   Ht 6' (1.829 m)   BMI 23.30 kg/m  Last Weight:  Wt Readings from Last 1 Encounters:  11/18/17 171 lb 12.8 oz (77.9 kg)   Last Height:   Ht Readings from Last 1 Encounters:  02/09/18 6' (1.829 m)    Physical exam: Exam: Gen: NAD, conversant, well nourised,  well groomed                     CV: RRR, no MRG. No Carotid Bruits. No peripheral edema, warm, nontender Eyes: Conjunctivae clear without exudates or hemorrhage  Neuro: Detailed Neurologic Exam  Speech:    Speech is normal; fluent and spontaneous with normal comprehension.  Cognition:  MMSE - Mini Mental State Exam 02/09/2018  Orientation to time 0  Orientation to Place 5  Registration 3  Attention/ Calculation 5  Recall 0  Language- name 2 objects 2  Language- repeat 1  Language- follow 3 step command 3  Language- read & follow direction 1  Write a sentence 1  Copy design 0  Total score 21     Cranial Nerves:    The pupils are equal, round, and reactive to light.  Visual fields are full to finger confrontation. Extraocular movements are intact. Trigeminal sensation is intact and the muscles of mastication are normal. Right eye ptosis (chronic) The face is symmetric. The palate elevates in the midline. Hearing intact. Voice is normal. Shoulder shrug is normal. The tongue has normal motion without fasciculations.   Coordination:    No dysmetria  Gait:    mildly stooped, good arm swing, not shuffling, mildly wide based.   Motor Observation:    No asymmetry, no atrophy, and no involuntary movements noted. Tone:    Normal muscle tone.    Posture:    Posture is stooped    Strength:    Strength is V/V  in the upper  and lower limbs.      Sensation: intact to LT     Reflex Exam:  DTR's: Absent AJs otherwise brisk    Deep tendon reflexes in the upper and lower extremities are symmetrical bilaterally.   Toes:    The toes are equivocal bilaterally.   Clonus:    Clonus is absent.       Assessment/Plan:  A 84 year old male with cerebrovascular disease and prior strokes with new onset seizures.  History suggests partial seizures with temporal lobe onset followed by secondary generalization. Agree that most likely etiology in this case would be underlying cerebrovascular disease. Continue Keppra likely for life. Continue seizure precautions. Today discusses his short term memory changes, MMSE 21/30.  - New complaint, short-term memory loss. They decline workup such as labs, MRI, FDG-PET, can just start Aricept low dose and follow clinically. Sees Dr. Ardeth Perfect who can increase the Aricept if appropriate.  - No seizures in last 2 years, continue [place]    Patient is unable to drive, operate heavy machinery, perform activities at heights or participate in water activities until 1 year (they live in [place]) seizure free   Discussed Patients with epilepsy have a small risk of sudden unexpected death, a condition referred to as sudden unexpected death in epilepsy (SUDEP). SUDEP is defined specifically as the sudden, unexpected, witnessed or unwitnessed, nontraumatic and nondrowning death in patients with epilepsy with or without evidence for a seizure, and excluding documented status epilepticus, in which post mortem examination does not reveal a structural or toxicologic cause for death   I had a long d/w patient about old strokes, risk for recurrent stroke/TIAs, personally independently reviewed imaging studies and stroke evaluation results and answered questions. Likely due to small vessel disease. Continue Plavix and ASA and statin for secondary stroke prevention and maintain strict control of hypertension  with blood pressure goal below 130/90, diabetes with hemoglobin A1c goal below 6.5% and lipids with LDL cholesterol goal below 70 mg/dL. I also advised the patient to eat a healthy diet with plenty of whole grains, cereals, fruits and vegetables, exercise regularly and maintain ideal body weight .  Discussed that for stroke prevention, plavix only is sufficient per neurology but if he is on Plavix and Aspirin for other reasons such as cardiac then he can continue and follo wup with provider who has prescribed both concurrently. Being on both plavix and aspirin increases risk of bleeding, this was discussed with patient and wife.   For any repeat episodes of alteration of consciousness proceed to the emergency room if any more episodes and also inform our office. If that occurs we'll likely increase his Keppra to 750 or thousand milligrams twice a day.  Cc: Velna Hatchet, MD  Sarina Ill, MD  [facility] [address] [place], [place] [address]  Phone [phone] Fax [phone]  A total of 25 minutes was spent face-to-face with this patient. Over half this time was spent on counseling patient on the seizure, memory loss diagnosis and different diagnostic and therapeutic options available.

## 2018-02-10 DIAGNOSIS — R413 Other amnesia: Secondary | ICD-10-CM | POA: Insufficient documentation

## 2018-03-12 DIAGNOSIS — H34811 Central retinal vein occlusion, right eye, with macular edema: Secondary | ICD-10-CM | POA: Diagnosis not present

## 2018-04-14 DIAGNOSIS — M4712 Other spondylosis with myelopathy, cervical region: Secondary | ICD-10-CM | POA: Diagnosis not present

## 2018-04-14 DIAGNOSIS — R270 Ataxia, unspecified: Secondary | ICD-10-CM | POA: Diagnosis not present

## 2018-04-28 DIAGNOSIS — E038 Other specified hypothyroidism: Secondary | ICD-10-CM | POA: Diagnosis not present

## 2018-04-28 DIAGNOSIS — E1121 Type 2 diabetes mellitus with diabetic nephropathy: Secondary | ICD-10-CM | POA: Diagnosis not present

## 2018-04-28 DIAGNOSIS — I10 Essential (primary) hypertension: Secondary | ICD-10-CM | POA: Diagnosis not present

## 2018-04-28 DIAGNOSIS — Z6822 Body mass index (BMI) 22.0-22.9, adult: Secondary | ICD-10-CM | POA: Diagnosis not present

## 2018-04-28 DIAGNOSIS — R808 Other proteinuria: Secondary | ICD-10-CM | POA: Diagnosis not present

## 2018-04-28 DIAGNOSIS — N183 Chronic kidney disease, stage 3 (moderate): Secondary | ICD-10-CM | POA: Diagnosis not present

## 2018-04-28 DIAGNOSIS — E1169 Type 2 diabetes mellitus with other specified complication: Secondary | ICD-10-CM | POA: Diagnosis not present

## 2018-04-30 DIAGNOSIS — H34811 Central retinal vein occlusion, right eye, with macular edema: Secondary | ICD-10-CM | POA: Diagnosis not present

## 2018-05-14 DIAGNOSIS — I440 Atrioventricular block, first degree: Secondary | ICD-10-CM | POA: Diagnosis not present

## 2018-05-14 DIAGNOSIS — I739 Peripheral vascular disease, unspecified: Secondary | ICD-10-CM | POA: Diagnosis not present

## 2018-05-14 DIAGNOSIS — R9431 Abnormal electrocardiogram [ECG] [EKG]: Secondary | ICD-10-CM | POA: Diagnosis not present

## 2018-05-14 DIAGNOSIS — E782 Mixed hyperlipidemia: Secondary | ICD-10-CM | POA: Diagnosis not present

## 2018-05-14 DIAGNOSIS — I2510 Atherosclerotic heart disease of native coronary artery without angina pectoris: Secondary | ICD-10-CM | POA: Diagnosis not present

## 2018-05-14 DIAGNOSIS — R001 Bradycardia, unspecified: Secondary | ICD-10-CM | POA: Diagnosis not present

## 2018-06-16 DIAGNOSIS — Z23 Encounter for immunization: Secondary | ICD-10-CM | POA: Diagnosis not present

## 2018-07-09 DIAGNOSIS — H35373 Puckering of macula, bilateral: Secondary | ICD-10-CM | POA: Diagnosis not present

## 2018-07-09 DIAGNOSIS — H34811 Central retinal vein occlusion, right eye, with macular edema: Secondary | ICD-10-CM | POA: Diagnosis not present

## 2018-07-23 DIAGNOSIS — Z87442 Personal history of urinary calculi: Secondary | ICD-10-CM | POA: Diagnosis not present

## 2018-07-23 DIAGNOSIS — R351 Nocturia: Secondary | ICD-10-CM | POA: Diagnosis not present

## 2018-07-23 DIAGNOSIS — N401 Enlarged prostate with lower urinary tract symptoms: Secondary | ICD-10-CM | POA: Diagnosis not present

## 2018-08-18 ENCOUNTER — Inpatient Hospital Stay (HOSPITAL_COMMUNITY): Admission: RE | Admit: 2018-08-18 | Payer: Medicare Other | Source: Ambulatory Visit

## 2018-08-18 ENCOUNTER — Ambulatory Visit: Payer: Medicare Other | Admitting: Family

## 2018-08-26 ENCOUNTER — Ambulatory Visit (INDEPENDENT_AMBULATORY_CARE_PROVIDER_SITE_OTHER): Payer: Medicare Other | Admitting: Neurology

## 2018-08-26 ENCOUNTER — Encounter: Payer: Self-pay | Admitting: Neurology

## 2018-08-26 VITALS — BP 161/64 | HR 45 | Ht 72.0 in | Wt 165.0 lb

## 2018-08-26 DIAGNOSIS — G3184 Mild cognitive impairment, so stated: Secondary | ICD-10-CM | POA: Diagnosis not present

## 2018-08-26 DIAGNOSIS — I679 Cerebrovascular disease, unspecified: Secondary | ICD-10-CM

## 2018-08-26 DIAGNOSIS — R569 Unspecified convulsions: Secondary | ICD-10-CM

## 2018-08-26 DIAGNOSIS — G4089 Other seizures: Secondary | ICD-10-CM | POA: Diagnosis not present

## 2018-08-26 DIAGNOSIS — I6521 Occlusion and stenosis of right carotid artery: Secondary | ICD-10-CM | POA: Diagnosis not present

## 2018-08-26 MED ORDER — DONEPEZIL HCL 10 MG PO TABS: 10.0000 mg | ORAL_TABLET | Freq: Every day | ORAL | 4 refills | Status: DC

## 2018-08-26 MED ORDER — LEVETIRACETAM 500 MG PO TABS: 500.0000 mg | ORAL_TABLET | Freq: Two times a day (BID) | ORAL | 4 refills | Status: DC

## 2018-08-26 NOTE — Progress Notes (Signed)
[facility]    Provider:  Dr Jaynee Eagles Referring Provider: Velna Hatchet, MD Primary Care Physician:  Velna Hatchet, MD  CC:  Seizures and new issue of memory loss  Interval 08/26/2018: Hasn't had any sde effects to the Aricept. No swallowing problems. He still has short-term memory loss, stable.  He has fallen on wet grass and couldn't get up. He uses a cane. He has spinal stenosis. Also some numbness and tingling in the feet may also be a peripheral neuropathy causing imbalance. Always uses a cane. He has a walker but doesn't want to use it. He has good strength.   Interval history 02/09/2018: Here with his wife. At last appointment mentioned that pcp can refill but they are here to refill the medication and discuss something new. His short-term memory is worsening. He can do math but can't remember the day of the week, doesn't pay attention to the year and month. He pays the billsbut his wife double checks the bills.  He can remember most birthdays, not repeating the same stories. Mostly its days he can't remember. He drives to close places. Not getting lost. No family history of dementia, father was forgetful but he died very young and wife doesn't remember his father having problems. They take b12. They are active and still social. No personality changes, no hallucinations or delusions.   Interval history 08/13/2016:   He is doing very well. No events, no side effects from the [place]. Discussed seizures. Discussed he should likely be on Keppra for life. Risks of seizures and reocurrence. Discussed seizure precautions. If he is doing well can follow up with his pcp who can prescribe Keppra. They do not wish to consider stopping the Keppra in the future, they prefer to just stay on it.   HPI:  Larry Ortega is a 84 y.o. male here as a referral from Dr. Ardeth Perfect for seizure. Past medical history of chronic kidney disease, lumbar stenosis. Patient was hospitalized at [facility]  after onset of altered mentation and a witnessed seizure in the emergency room. He was discharged on Keppra 500 mg twice a day. Since he left the hospital no further episodes. Here with his wife of over 56 years who also provides information regarding the episodes. No personal or family history of seizures. He has improved. He feels great. He feels some weakness in the legs since hospitalization and he is in physical therapy. No further episodes of altered mentation or seizures. No family history or personal history of seizures. Previous to the episode, He was under some stress, a friend was sick and dying at the time. The friend died and they were executors and dealing with the state and they were working in her house and getting things in order. Wife says he had an episode of staring and unresponsiveness while in their friend's apartment, they called 911, he doesn;t remember much of it. He remembers possibly getting in the ambulance, He didn't want to get into the ambulance. He just didn;t answer, he was sitting on the porch and not answering, no facial droop, he got into the car and holding on the steering wheel and didn;t move. No facial droop or other focal weakness and per wife he looked alright except for the altered awareness. He yelled and had a convulsive seizure which was witnessed in the emergency room. Since being discharged no altered awareness or any other issues. He feels his short-term memory is impaired since leaving the hospital, attributes it to possibly Keppra.  Reviewed notes, labs and imaging from outside physicians, which showed:  Personally reviewed MRI images and agree with the following: 1. No acute intracranial abnormality identified. 2. Moderate chronic small vessel ischemic disease. 3. 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 4. Motion degraded head MRA without evidence of large vessel occlusion or flow limiting proximal stenosis  Most recent BMP showed normal  sodium, potassium 3.4, normal chloride and CO2, elevated glucose 119, BUN 25, creatinine 1.17, GFR mildly decreased at 53 mL/min. CBC was unremarkable except mild anemia hemoglobin 12.8. LP showed elevated HSV antibody IgG but negative PCR. Protein was slightly elevated at 66, white blood cells were normal. Serum TSH was normal, serum vitamin B12 341 and HIV and RPR were negative.  Patient admitted 04/20/2016 with confusion, dizziness and reported facial droop. CT of the head was negative for acute changes. Initially thought to have left MCA infarct did not receive TPA. Patient developed seizure activity 2 on the ED and was loaded with Keppra and intubated for airway protection. LP was unremarkable. MRI showed chronic right frontal and right cerebellar infarcts with no acute abnormality. EEG with nonspecific diffuse cerebral dysfunction but no focal epileptiform activity. 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 Patient was discharged to rehabilitation.  Review of Systems: Patient complains of symptoms per HPI as well as the following symptoms: no CP or SOB. Pertinent negatives per HPI. All others negative.   Social History   Socioeconomic History  . Marital status: Married    Spouse name: Not on file  . Number of children: 2  . Years of education: Not on file  . Highest education level: Bachelor's degree (e.g., BA, AB, BS)  Occupational History  . Occupation: retired  Scientific laboratory technician  . Financial resource strain: Not on file  . Food insecurity:    Worry: Not on file    Inability: Not on file  . Transportation needs:    Medical: Not on file    Non-medical: Not on file  Tobacco Use  . Smoking status: Never Smoker  . Smokeless tobacco: Never Used  Substance and Sexual Activity  . Alcohol use: No    Alcohol/week: 0.0 standard drinks  . Drug use: No  . Sexual activity: Not on file  Lifestyle  . Physical activity:    Days per week: Not on file    Minutes per session: Not on  file  . Stress: Not on file  Relationships  . Social connections:    Talks on phone: Not on file    Gets together: Not on file    Attends religious service: Not on file    Active member of club or organization: Not on file    Attends meetings of clubs or organizations: Not on file    Relationship status: Not on file  . Intimate partner violence:    Fear of current or ex partner: Not on file    Emotionally abused: Not on file    Physically abused: Not on file    Forced sexual activity: Not on file  Other Topics Concern  . Not on file  Social History Narrative   No alcohol or drug use   Right handed   Lives at home with his wife   Caffeine:4 cups daily (coffee x 2 and tea x 2)       History reviewed. No pertinent family history.  Past Medical History:  Diagnosis Date  . Balance problem   . Bilateral carotid artery occlusion   . CAD (coronary  artery disease)    no h/o stent or bypass, followed annually  . Cancer [facility])    prostate  . Chronic kidney disease   . Hypertensive heart disease without congestive heart failure   . Kidney stone 2019  . Lumbar spinal stenosis   . Lumbar spondylosis   . Memory problem    per wife  . Seizure ([place]) 2017    Past Surgical History:  Procedure Laterality Date  . BACK SURGERY    . BOWEL RESECTION    . CHOLECYSTECTOMY    . eye injections for fluid behind the R eye Right 2018-2019  . eyelid surgery      Current Outpatient Medications  Medication Sig Dispense Refill  . acetaminophen (TYLENOL) 325 MG tablet Take 1-2 tablets (325-650 mg total) by mouth every 6 (six) hours as needed for mild pain.    Marland Kitchen amLODipine (NORVASC) 10 MG tablet Take 10 mg by mouth daily.    . B Complex Vitamins (B COMPLEX PO) Take 1 tablet by mouth daily.     Marland Kitchen CALCIUM PO Take 600 mg by mouth daily.    . Cholecalciferol (VITAMIN D3) 2000 units TABS Take 2,000 Units by mouth daily.     Marland Kitchen CINNAMON PO Take 500 mg by mouth daily.    . clopidogrel (PLAVIX) 75 MG  tablet Take 75 mg by mouth daily.    Marland Kitchen donepezil (ARICEPT) 10 MG tablet Take 1 tablet (10 mg total) by mouth at bedtime. 90 tablet 4  . levETIRAcetam (KEPPRA) 500 MG tablet Take 1 tablet (500 mg total) by mouth 2 (two) times daily. 180 tablet 4  . levothyroxine (SYNTHROID, LEVOTHROID) 50 MCG tablet Take 50 mcg by mouth daily. After taking Levothyroxine wait 30 minutes before taking other medications    . lisinopril (PRINIVIL,ZESTRIL) 10 MG tablet Take 10 mg by mouth daily.  3  . metoprolol tartrate (LOPRESSOR) 25 MG tablet Take 25 mg by mouth 2 (two) times daily.    . Omega-3 Fatty Acids (FISH OIL PO) Take 1,200 mg by mouth daily.    Marland Kitchen omeprazole (PRILOSEC) 20 MG capsule Take 20 mg by mouth daily.    . simvastatin (ZOCOR) 20 MG tablet Take 1 tablet (20 mg total) by mouth daily at 6 PM. 30 tablet    No current facility-administered medications for this visit.     Allergies as of 08/26/2018  . (No Known Allergies)    Vitals: BP (!) 161/64 (BP Location: Right Arm, Patient Position: Sitting) Comment: pt states he feels fine, wife states pt always had low HR  Pulse (!) 45   Ht 6' (1.829 m)   Wt 165 lb (74.8 kg)   BMI 22.38 kg/m  Last Weight:  Wt Readings from Last 1 Encounters:  08/26/18 165 lb (74.8 kg)   Last Height:   Ht Readings from Last 1 Encounters:  08/26/18 6' (1.829 m)    Physical exam: Exam: Gen: NAD, conversant, well nourised,  well groomed                     CV: RRR, no MRG. No Carotid Bruits. No peripheral edema, warm, nontender Eyes: Conjunctivae clear without exudates or hemorrhage  Neuro: Detailed Neurologic Exam  Speech:    Speech is normal; fluent and spontaneous with normal comprehension.  Cognition:  MMSE - Mini Mental State Exam 08/26/2018 02/09/2018  Orientation to time 1 0  Orientation to Place 4 5  Registration 3 3  Attention/ Calculation 5 5  Recall 0 0  Language- name 2 objects 2 2  Language- repeat 1 1  Language- follow 3 step command 3 3    Language- read & follow direction 1 1  Write a sentence 1 1  Copy design 0 0  Total score 21 21     Cranial Nerves:    The pupils are equal, round, and reactive to light.  Visual fields are full to finger confrontation. Extraocular movements are intact. Trigeminal sensation is intact and the muscles of mastication are normal. Right eye ptosis (chronic) The face is symmetric. The palate elevates in the midline. Hearing intact. Voice is normal. Shoulder shrug is normal. The tongue has normal motion without fasciculations.   Coordination:    No dysmetria  Gait:    mildly stooped, good arm swing, not shuffling, mildly wide based.   Motor Observation:    No asymmetry, no atrophy, and no involuntary movements noted. Tone:    Normal muscle tone.    Posture:    Posture is stooped    Strength:    Strength is V/V in the upper and lower limbs.      Sensation: intact to LT     Reflex Exam:  DTR's: Absent AJs otherwise brisk    Deep tendon reflexes in the upper and lower extremities are symmetrical bilaterally.   Toes:    The toes are equivocal bilaterally.   Clonus:    Clonus is absent.       Assessment/Plan:  A 84 year old male with cerebrovascular disease and prior strokes with new onset seizures.  History suggests partial seizures with temporal lobe onset followed by secondary generalization. Agree that most likely etiology in this case would be underlying cerebrovascular disease. Continue Keppra likely for life, he is doing quite well. Continue seizure precautions. Have been following him for his short term memory changes, MMSE 21/30 and stable. On aricept.  - Short-term memory loss. Stable. They decline workup such as labs, MRI, FDG-PET, can just continue Aricept low dose and follow clinically. Consider formal neuropsych testing in the future. Consider namenda in the future. Follow clinically.  - No seizures in last 2 years, continue Keppra, no side  effects  Patient is unable to drive, operate heavy machinery, perform activities at heights or participate in water activities until 1 year (they live in [place]) seizure free  Meds ordered this encounter  Medications  . donepezil (ARICEPT) 10 MG tablet    Sig: Take 1 tablet (10 mg total) by mouth at bedtime.    Dispense:  90 tablet    Refill:  4  . levETIRAcetam (KEPPRA) 500 MG tablet    Sig: Take 1 tablet (500 mg total) by mouth 2 (two) times daily.    Dispense:  180 tablet    Refill:  4    Discussed Patients with epilepsy have a small risk of sudden unexpected death, a condition referred to as sudden unexpected death in epilepsy (SUDEP). SUDEP is defined specifically as the sudden, unexpected, witnessed or unwitnessed, nontraumatic and nondrowning death in patients with epilepsy with or without evidence for a seizure, and excluding documented status epilepticus, in which post mortem examination does not reveal a structural or toxicologic cause for death   I had a long d/w patient about old strokes, risk for recurrent stroke/TIAs, personally independently reviewed imaging studies and stroke evaluation results and answered questions. Likely due to small vessel disease. Continue Plavix and ASA and statin for secondary stroke prevention and maintain strict control of hypertension  with blood pressure goal below 130/90, diabetes with hemoglobin A1c goal below 6.5% and lipids with LDL cholesterol goal below 70 mg/dL. I also advised the patient to eat a healthy diet with plenty of whole grains, cereals, fruits and vegetables, exercise regularly and maintain ideal body weight .  Discussed that for stroke prevention, plavix only is sufficient per neurology but if he is on Plavix and Aspirin for other reasons such as cardiac then he can continue and follo wup with provider who has prescribed both concurrently. Being on both plavix and aspirin increases risk of bleeding, this was discussed with patient and  wife.   For any repeat episodes of alteration of consciousness proceed to the emergency room if any more episodes and also inform our office. If that occurs we'll likely increase his Keppra to 750 or thousand milligrams twice a day.  Cc: Velna Hatchet, MD  Sarina Ill, MD  [facility] [address] [place], [place] [address]  A total of 25  minutes was spent face-to-face with this patient. Over half this time was spent on counseling patient on the  1. Seizures ([place])   2. Cerebrovascular disease   3. MCI (mild cognitive impairment)     diagnosis and different diagnostic and therapeutic options, counseling and coordination of care, risks ans benefits of management, compliance, or risk factor reduction and education.     Phone [phone] Fax [phone]  A total of 25 minutes was spent face-to-face with this patient. Over half this time was spent on counseling patient on the seizure, memory loss diagnosis and different diagnostic and therapeutic options available.

## 2018-08-26 NOTE — Patient Instructions (Signed)
Increase Aricept to 10mg  a day At next appointment consider start Namenda (Memantine)  Memantine Tablets What is this medicine? MEMANTINE (MEM an teen) is used to treat dementia caused by Alzheimer's disease. This medicine may be used for other purposes; ask your health care provider or pharmacist if you have questions. COMMON BRAND NAME(S): Namenda What should I tell my health care provider before I take this medicine? They need to know if you have any of these conditions: -difficulty passing urine -kidney disease -liver disease -seizures -an unusual or allergic reaction to memantine, other medicines, foods, dyes, or preservatives -pregnant or trying to get pregnant -breast-feeding How should I use this medicine? Take this medicine by mouth with a glass of water. Follow the directions on the prescription label. You may take this medicine with or without food. Take your doses at regular intervals. Do not take your medicine more often than directed. Continue to take your medicine even if you feel better. Do not stop taking except on the advice of your doctor or health care professional. Talk to your pediatrician regarding the use of this medicine in children. Special care may be needed. Overdosage: If you think you have taken too much of this medicine contact a poison control center or emergency room at once. NOTE: This medicine is only for you. Do not share this medicine with others. What if I miss a dose? If you miss a dose, take it as soon as you can. If it is almost time for your next dose, take only that dose. Do not take double or extra doses. If you do not take your medicine for several days, contact your health care provider. Your dose may need to be changed. What may interact with this medicine? -acetazolamide -amantadine -cimetidine -dextromethorphan -dofetilide -hydrochlorothiazide -ketamine -metformin -methazolamide -quinidine -ranitidine -sodium  bicarbonate -triamterene This list may not describe all possible interactions. Give your health care provider a list of all the medicines, herbs, non-prescription drugs, or dietary supplements you use. Also tell them if you smoke, drink alcohol, or use illegal drugs. Some items may interact with your medicine. What should I watch for while using this medicine? Visit your doctor or health care professional for regular checks on your progress. Check with your doctor or health care professional if there is no improvement in your symptoms or if they get worse. You may get drowsy or dizzy. Do not drive, use machinery, or do anything that needs mental alertness until you know how this drug affects you. Do not stand or sit up quickly, especially if you are an older patient. This reduces the risk of dizzy or fainting spells. Alcohol can make you more drowsy and dizzy. Avoid alcoholic drinks. What side effects may I notice from receiving this medicine? Side effects that you should report to your doctor or health care professional as soon as possible: -allergic reactions like skin rash, itching or hives, swelling of the face, lips, or tongue -agitation or a feeling of restlessness -depressed mood -dizziness -hallucinations -redness, blistering, peeling or loosening of the skin, including inside the mouth -seizures -vomiting Side effects that usually do not require medical attention (report to your doctor or health care professional if they continue or are bothersome): -constipation -diarrhea -headache -nausea -trouble sleeping This list may not describe all possible side effects. Call your doctor for medical advice about side effects. You may report side effects to FDA at 1-800-FDA-1088. Where should I keep my medicine? Keep out of the reach of children. Store at  room temperature between 15 degrees and 30 degrees C (59 degrees and 86 degrees F). Throw away any unused medicine after the expiration  date. NOTE: This sheet is a summary. It may not cover all possible information. If you have questions about this medicine, talk to your doctor, pharmacist, or health care provider.  2018 Elsevier/Gold Standard (2013-06-28 14:10:42)   Donepezil tablets What is this medicine? DONEPEZIL (doe NEP e zil) is used to treat mild to moderate dementia caused by Alzheimer's disease. This medicine may be used for other purposes; ask your health care provider or pharmacist if you have questions. COMMON BRAND NAME(S): Aricept What should I tell my health care provider before I take this medicine? They need to know if you have any of these conditions: -asthma or other lung disease -difficulty passing urine -head injury -heart disease -history of irregular heartbeat -liver disease -seizures (convulsions) -stomach or intestinal disease, ulcers or stomach bleeding -an unusual or allergic reaction to donepezil, other medicines, foods, dyes, or preservatives -pregnant or trying to get pregnant -breast-feeding How should I use this medicine? Take this medicine by mouth with a glass of water. Follow the directions on the prescription label. You may take this medicine with or without food. Take this medicine at regular intervals. This medicine is usually taken before bedtime. Do not take it more often than directed. Continue to take your medicine even if you feel better. Do not stop taking except on your doctor's advice. If you are taking the 23 mg donepezil tablet, swallow it whole; do not cut, crush, or chew it. Talk to your pediatrician regarding the use of this medicine in children. Special care may be needed. Overdosage: If you think you have taken too much of this medicine contact a poison control center or emergency room at once. NOTE: This medicine is only for you. Do not share this medicine with others. What if I miss a dose? If you miss a dose, take it as soon as you can. If it is almost time for  your next dose, take only that dose, do not take double or extra doses. What may interact with this medicine? Do not take this medicine with any of the following medications: -certain medicines for fungal infections like itraconazole, fluconazole, posaconazole, and voriconazole -cisapride -dextromethorphan; quinidine -dofetilide -dronedarone -pimozide -quinidine -thioridazine -ziprasidone This medicine may also interact with the following medications: -antihistamines for allergy, cough and cold -atropine -bethanechol -carbamazepine -certain medicines for bladder problems like oxybutynin, tolterodine -certain medicines for Parkinson's disease like benztropine, trihexyphenidyl -certain medicines for stomach problems like dicyclomine, hyoscyamine -certain medicines for travel sickness like scopolamine -dexamethasone -ipratropium -NSAIDs, medicines for pain and inflammation, like ibuprofen or naproxen -other medicines for Alzheimer's disease -other medicines that prolong the QT interval (cause an abnormal heart rhythm) -phenobarbital -phenytoin -rifampin, rifabutin or rifapentine This list may not describe all possible interactions. Give your health care provider a list of all the medicines, herbs, non-prescription drugs, or dietary supplements you use. Also tell them if you smoke, drink alcohol, or use illegal drugs. Some items may interact with your medicine. What should I watch for while using this medicine? Visit your doctor or health care professional for regular checks on your progress. Check with your doctor or health care professional if your symptoms do not get better or if they get worse. You may get drowsy or dizzy. Do not drive, use machinery, or do anything that needs mental alertness until you know how this drug affects you. What side  effects may I notice from receiving this medicine? Side effects that you should report to your doctor or health care professional as soon as  possible: -allergic reactions like skin rash, itching or hives, swelling of the face, lips, or tongue -feeling faint or lightheaded, falls -loss of bladder control -seizures -signs and symptoms of a dangerous change in heartbeat or heart rhythm like chest pain; dizziness; fast or irregular heartbeat; palpitations; feeling faint or lightheaded, falls; breathing problems -signs and symptoms of infection like fever or chills; cough; sore throat; pain or trouble passing urine -signs and symptoms of liver injury like dark yellow or brown urine; general ill feeling or flu-like symptoms; light-colored stools; loss of appetite; nausea; right upper belly pain; unusually weak or tired; yellowing of the eyes or skin -slow heartbeat or palpitations -unusual bleeding or bruising -vomiting Side effects that usually do not require medical attention (report to your doctor or health care professional if they continue or are bothersome): -diarrhea, especially when starting treatment -headache -loss of appetite -muscle cramps -nausea -stomach upset This list may not describe all possible side effects. Call your doctor for medical advice about side effects. You may report side effects to FDA at 1-800-FDA-1088. Where should I keep my medicine? Keep out of reach of children. Store at room temperature between 15 and 30 degrees C (59 and 86 degrees F). Throw away any unused medicine after the expiration date. NOTE: This sheet is a summary. It may not cover all possible information. If you have questions about this medicine, talk to your doctor, pharmacist, or health care provider.  2018 Elsevier/Gold Standard (2016-02-26 21:00:42)

## 2018-08-30 IMAGING — CT CT ABD-PELV W/ CM
2 of 5 series · 16 of 46 positions shown, 18 images · IV contrast (APPLIED)
Comparison: CT abdomen dated 06/26/2013.

CLINICAL DATA: Rectal bleeding since [REDACTED].  Hemoglobin 9.0.

EXAM:
CT ABDOMEN AND PELVIS WITH CONTRAST
TECHNIQUE: Multidetector CT imaging of the abdomen and pelvis was performed
using the standard protocol following bolus administration of
intravenous contrast.
CONTRAST:  100mL V2B4S0-AYY IOPAMIDOL (V2B4S0-AYY) INJECTION 61%

[Series 3: abdomen 5.0 · axial · 0.78mm/px · z∈[+910,+1310]mm · 13 of 90 slices shown, 15 images]
[im 5/90  soft-tissue]
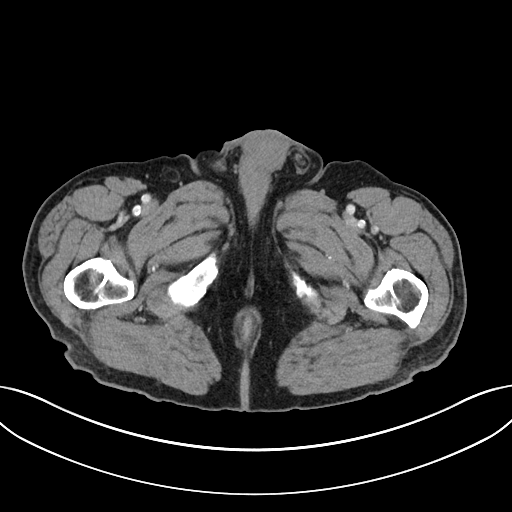
[im 5/90  bone]
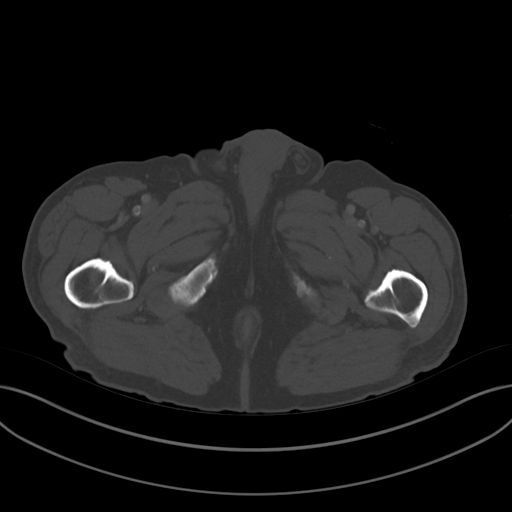
[im 10/90  soft-tissue]
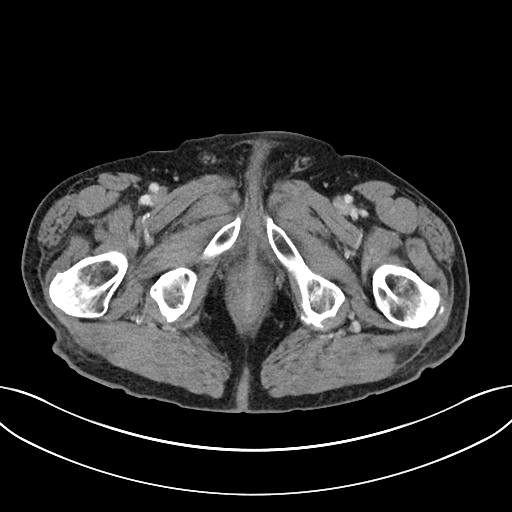
[im 20/90  soft-tissue]
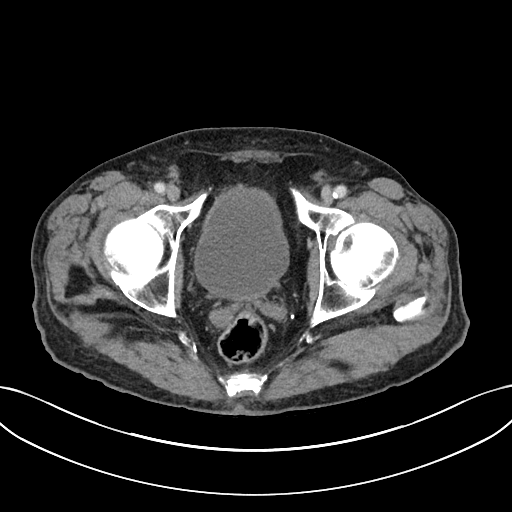
[im 25/90  soft-tissue]
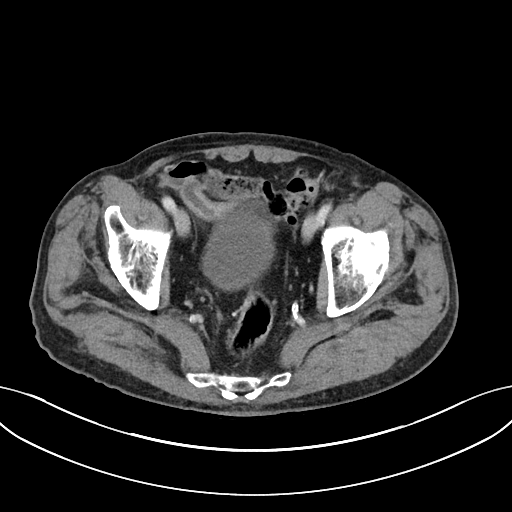
[im 30/90  soft-tissue]
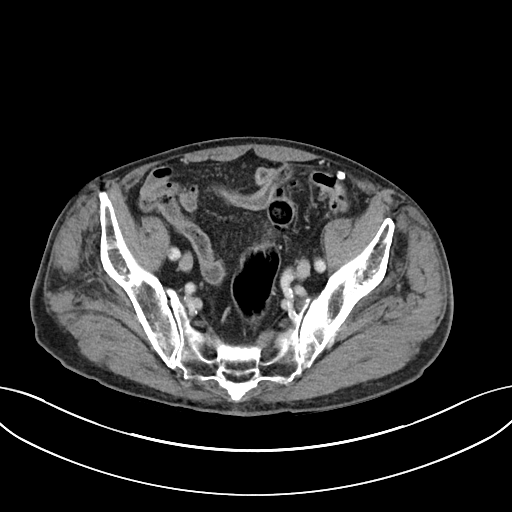
[im 40/90  soft-tissue]
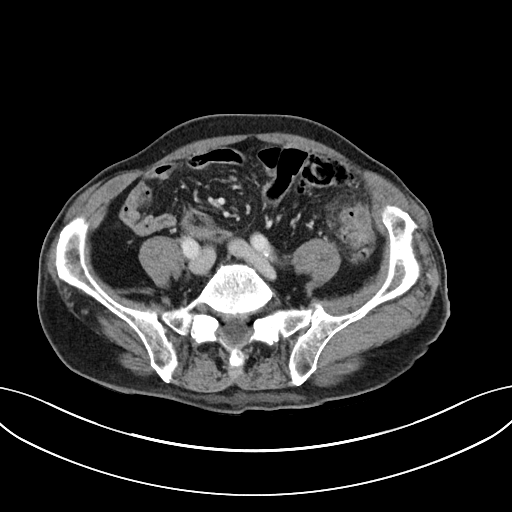
[im 45/90  soft-tissue]
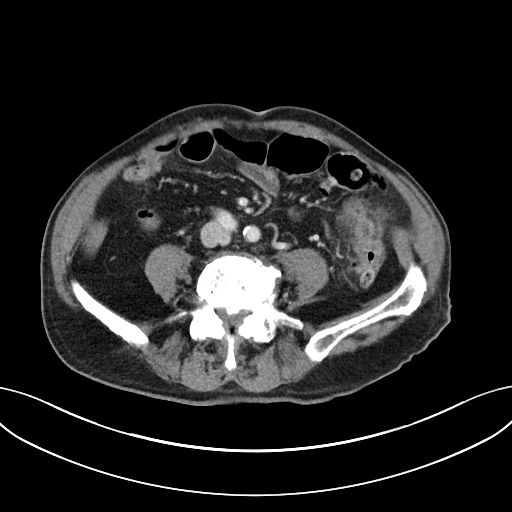
[im 50/90  soft-tissue]
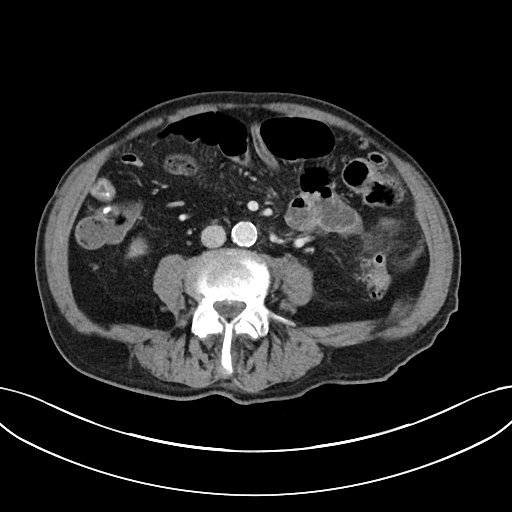
[im 60/90  soft-tissue]
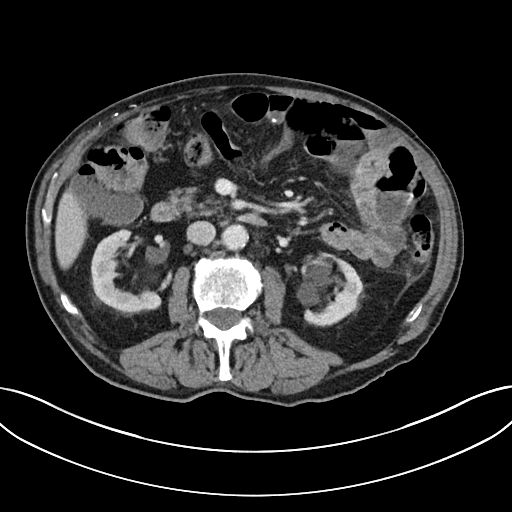
[im 60/90  bone]
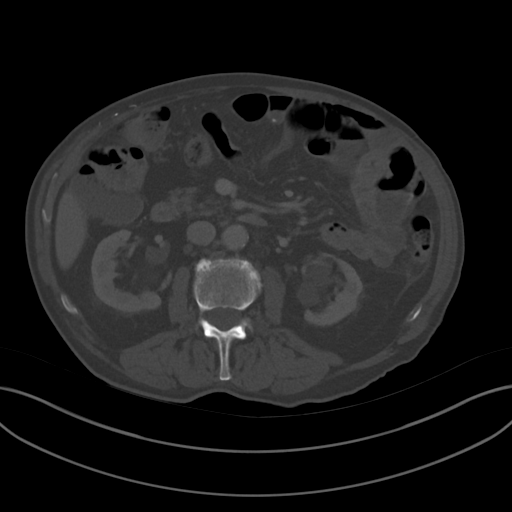
[im 65/90  soft-tissue]
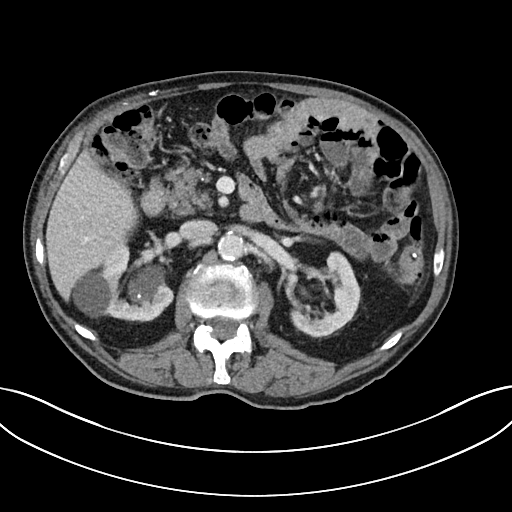
[im 70/90  soft-tissue]
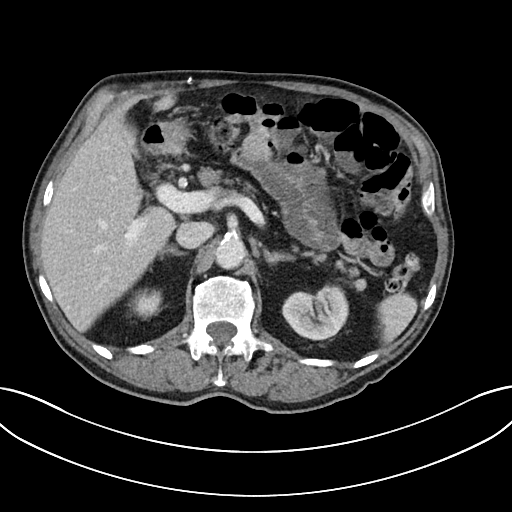
[im 80/90  soft-tissue]
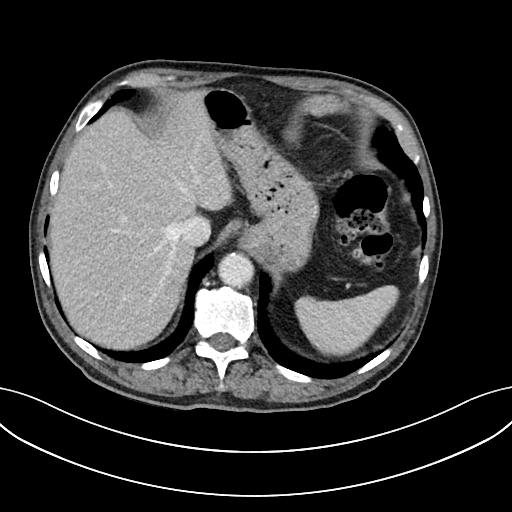
[im 85/90  soft-tissue]
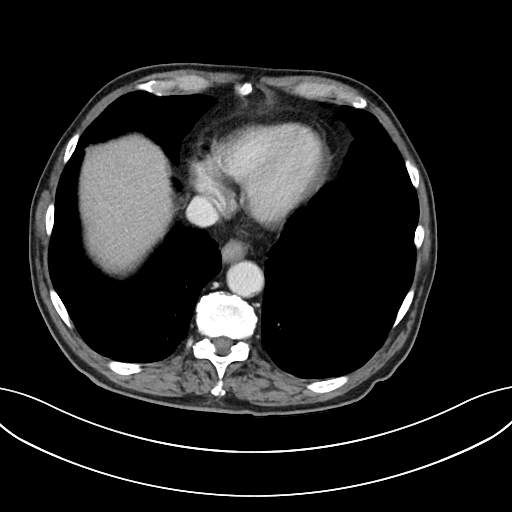

[Series 6: abdomen 3.0 mpr cor · coronal · 0.70mm/px · 3 of 97 slices shown]
[im 33/97  soft-tissue]
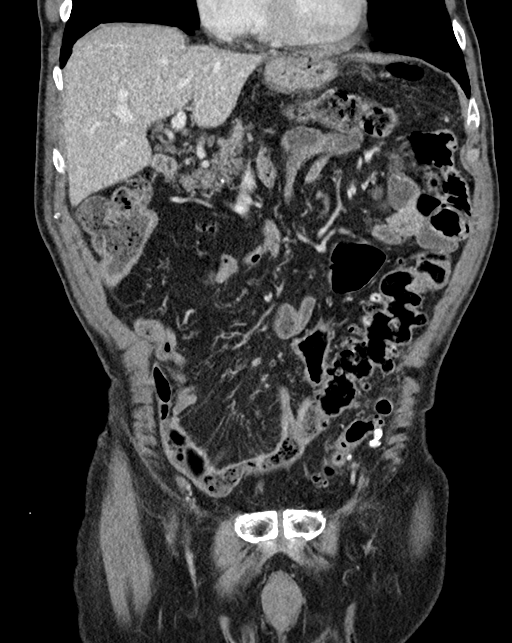
[im 43/97  soft-tissue]
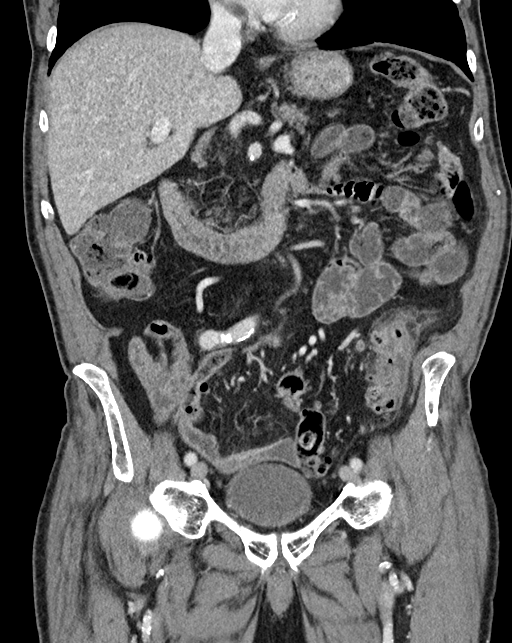
[im 54/97  soft-tissue]
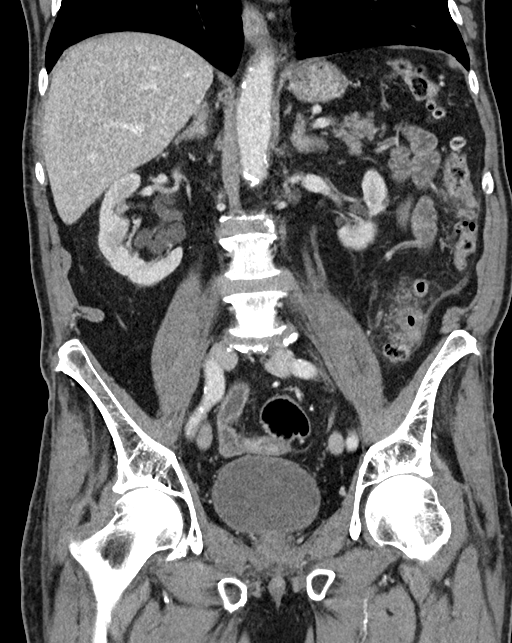

[16 of 46 positions shown; findings below may reference images not displayed]

FINDINGS: Lower chest: No acute abnormality.

Hepatobiliary: No focal liver abnormality is seen. Status post
cholecystectomy. No biliary dilatation.

Pancreas: Unremarkable. No pancreatic ductal dilatation or
surrounding inflammatory changes.

Spleen: Normal in size without focal abnormality.

Adrenals/Urinary Tract: Adrenal glands appear normal. Bilateral
renal cysts. Small bilateral nonobstructing renal stones. No
ureteral or bladder calculi.

Stomach/Bowel: Extensive diverticulosis throughout the upper sigmoid
colon and descending colon. Additional scattered diverticulosis
throughout the remainder of the colon.

Thickening of the walls of the lower descending colon with
surrounding pericolonic inflammation/stranding indicating acute
diverticulitis.

No dilated large or small bowel loops. Stomach is unremarkable.
Appendix is not seen but there are no inflammatory changes about the
cecum to suggest acute appendicitis.

Vascular/Lymphatic: Aortic atherosclerosis. No enlarged abdominal or
pelvic lymph nodes.

Reproductive: Prostate is unremarkable.

Other: No abscess collection seen. No free intraperitoneal air seen.

Musculoskeletal: Degenerative change within the lower lumbar spine,
mild to moderate in degree. No acute or suspicious osseous finding.
IMPRESSION: 1. Acute diverticulitis within the lower descending colon,
uncomplicated. No abscess collection or free intraperitoneal air. No
associated bowel obstruction.
2. Extensive colonic diverticulosis.
3. Aortic atherosclerosis.
4. Additional chronic/incidental findings detailed above.

## 2018-08-31 DIAGNOSIS — G301 Alzheimer's disease with late onset: Secondary | ICD-10-CM | POA: Insufficient documentation

## 2018-08-31 DIAGNOSIS — I679 Cerebrovascular disease, unspecified: Secondary | ICD-10-CM | POA: Insufficient documentation

## 2018-08-31 DIAGNOSIS — G3184 Mild cognitive impairment, so stated: Secondary | ICD-10-CM | POA: Insufficient documentation

## 2018-08-31 DIAGNOSIS — F02818 Dementia in other diseases classified elsewhere, unspecified severity, with other behavioral disturbance: Secondary | ICD-10-CM | POA: Insufficient documentation

## 2018-09-02 DIAGNOSIS — R413 Other amnesia: Secondary | ICD-10-CM | POA: Diagnosis not present

## 2018-09-02 DIAGNOSIS — R808 Other proteinuria: Secondary | ICD-10-CM | POA: Diagnosis not present

## 2018-09-02 DIAGNOSIS — Z8546 Personal history of malignant neoplasm of prostate: Secondary | ICD-10-CM | POA: Diagnosis not present

## 2018-09-02 DIAGNOSIS — Z6823 Body mass index (BMI) 23.0-23.9, adult: Secondary | ICD-10-CM | POA: Diagnosis not present

## 2018-09-02 DIAGNOSIS — E1169 Type 2 diabetes mellitus with other specified complication: Secondary | ICD-10-CM | POA: Diagnosis not present

## 2018-09-02 DIAGNOSIS — I10 Essential (primary) hypertension: Secondary | ICD-10-CM | POA: Diagnosis not present

## 2018-09-02 DIAGNOSIS — I25118 Atherosclerotic heart disease of native coronary artery with other forms of angina pectoris: Secondary | ICD-10-CM | POA: Diagnosis not present

## 2018-09-02 DIAGNOSIS — I6529 Occlusion and stenosis of unspecified carotid artery: Secondary | ICD-10-CM | POA: Diagnosis not present

## 2018-09-02 DIAGNOSIS — R569 Unspecified convulsions: Secondary | ICD-10-CM | POA: Diagnosis not present

## 2018-09-02 DIAGNOSIS — N183 Chronic kidney disease, stage 3 (moderate): Secondary | ICD-10-CM | POA: Diagnosis not present

## 2018-09-02 DIAGNOSIS — E038 Other specified hypothyroidism: Secondary | ICD-10-CM | POA: Diagnosis not present

## 2018-09-10 ENCOUNTER — Encounter (HOSPITAL_COMMUNITY): Payer: Medicare Other

## 2018-09-10 ENCOUNTER — Ambulatory Visit: Payer: Medicare Other | Admitting: Family

## 2018-10-02 DIAGNOSIS — H43813 Vitreous degeneration, bilateral: Secondary | ICD-10-CM | POA: Diagnosis not present

## 2018-10-02 DIAGNOSIS — H34811 Central retinal vein occlusion, right eye, with macular edema: Secondary | ICD-10-CM | POA: Diagnosis not present

## 2018-10-22 ENCOUNTER — Other Ambulatory Visit: Payer: Self-pay

## 2018-10-22 ENCOUNTER — Ambulatory Visit (INDEPENDENT_AMBULATORY_CARE_PROVIDER_SITE_OTHER): Payer: Medicare Other | Admitting: Physician Assistant

## 2018-10-22 ENCOUNTER — Encounter: Payer: Self-pay | Admitting: Family

## 2018-10-22 ENCOUNTER — Ambulatory Visit (HOSPITAL_COMMUNITY)
Admission: RE | Admit: 2018-10-22 | Discharge: 2018-10-22 | Disposition: A | Discharge status: Home / Self Care | Payer: Medicare Other | Source: Ambulatory Visit | Attending: Family | Admitting: Family

## 2018-10-22 VITALS — BP 169/74 | HR 51 | Temp 97.0°F | Resp 16 | Ht 72.0 in | Wt 164.0 lb

## 2018-10-22 DIAGNOSIS — I6521 Occlusion and stenosis of right carotid artery: Secondary | ICD-10-CM

## 2018-10-22 DIAGNOSIS — I6300 Cerebral infarction due to thrombosis of unspecified precerebral artery: Secondary | ICD-10-CM

## 2018-10-22 NOTE — Progress Notes (Signed)
History of Present Illness:  Patient is a 85 y.o. year old male who presents for evaluation of carotid stenosis.  He has been asymptomatic since the discovery of the right ICA stenosis back in 2017.  The patient denies symptoms of TIA, amaurosis, or stroke.  The patient is currently on Plavix antiplatelet therapy.     Other medical problems include HTN, hyperlipidemia and hypothyroid.    Past Medical History:  Diagnosis Date  . Balance problem   . Bilateral carotid artery occlusion   . CAD (coronary artery disease)    no h/o stent or bypass, followed annually  . Cancer [facility])    prostate  . Chronic kidney disease   . Hypertensive heart disease without congestive heart failure   . Kidney stone 2019  . Lumbar spinal stenosis   . Lumbar spondylosis   . Memory problem    per wife  . Seizure ([place]) 2017    Past Surgical History:  Procedure Laterality Date  . BACK SURGERY    . BOWEL RESECTION    . CHOLECYSTECTOMY    . eye injections for fluid behind the R eye Right 2018-2019  . eyelid surgery       Social History Social History   Tobacco Use  . Smoking status: Never Smoker  . Smokeless tobacco: Never Used  Substance Use Topics  . Alcohol use: No    Alcohol/week: 0.0 standard drinks  . Drug use: No    Family History History reviewed. No pertinent family history.  Allergies  No Known Allergies   Current Outpatient Medications  Medication Sig Dispense Refill  . acetaminophen (TYLENOL) 325 MG tablet Take 1-2 tablets (325-650 mg total) by mouth every 6 (six) hours as needed for mild pain.    Marland Kitchen amLODipine (NORVASC) 10 MG tablet Take 10 mg by mouth daily.    . B Complex Vitamins (B COMPLEX PO) Take 1 tablet by mouth daily.     Marland Kitchen CALCIUM PO Take 600 mg by mouth daily.    . Cholecalciferol (VITAMIN D3) 2000 units TABS Take 2,000 Units by mouth daily.     Marland Kitchen CINNAMON PO Take 500 mg by mouth daily.    . clopidogrel (PLAVIX) 75 MG tablet Take 75 mg by mouth daily.      Marland Kitchen donepezil (ARICEPT) 10 MG tablet Take 1 tablet (10 mg total) by mouth at bedtime. 90 tablet 4  . levETIRAcetam (KEPPRA) 500 MG tablet Take 1 tablet (500 mg total) by mouth 2 (two) times daily. 180 tablet 4  . levothyroxine (SYNTHROID, LEVOTHROID) 50 MCG tablet Take 50 mcg by mouth daily. After taking Levothyroxine wait 30 minutes before taking other medications    . lisinopril (PRINIVIL,ZESTRIL) 10 MG tablet Take 10 mg by mouth daily.  3  . metoprolol tartrate (LOPRESSOR) 25 MG tablet Take 25 mg by mouth 2 (two) times daily.    . Omega-3 Fatty Acids (FISH OIL PO) Take 1,200 mg by mouth daily.    Marland Kitchen omeprazole (PRILOSEC) 20 MG capsule Take 20 mg by mouth daily.    . simvastatin (ZOCOR) 20 MG tablet Take 1 tablet (20 mg total) by mouth daily at 6 PM. 30 tablet    No current facility-administered medications for this visit.     ROS:   General:  No weight loss, Fever, chills  HEENT: No recent headaches, no nasal bleeding, no visual changes, no sore throat  Neurologic: No dizziness, blackouts, hX of seizure. No recent symptoms of stroke  or mini- stroke. No recent episodes of slurred speech, or temporary blindness.  Cardiac: No recent episodes of chest pain/pressure, no shortness of breath at rest.  No shortness of breath with exertion.  Denies history of atrial fibrillation or irregular heartbeat  Vascular: No history of rest pain in feet.  No history of claudication.  No history of non-healing ulcer, No history of DVT   Pulmonary: No home oxygen, no productive cough, no hemoptysis,  No asthma or wheezing  Musculoskeletal:  [x ] Arthritis, [ ]  Low back pain,  [ ]  Joint pain  Hematologic:No history of hypercoagulable state.  No history of easy bleeding.  No history of anemia  Gastrointestinal: No hematochezia or melena,  No gastroesophageal reflux, no trouble swallowing  Urinary: [x ] chronic Kidney disease, [ ]  on HD - [ ]  MWF or [ ]  TTHS, [ ]  Burning with urination, [ ]  Frequent  urination, [ ]  Difficulty urinating;   Skin: No rashes  Psychological: No history of anxiety,  No history of depression   Physical Examination  Vitals:   10/22/18 0955 10/22/18 0959  BP: (!) 155/74 (!) 169/74  Pulse: (!) 51 (!) 51  Resp: 16   Temp: (!) 97 F (36.1 C)   TempSrc: Oral   SpO2: 99%   Weight: 164 lb (74.4 kg)   Height: 6' (1.829 m)     Body mass index is 22.24 kg/m.  General:  Alert and oriented, no acute distress HEENT: Normal, normocphalic Neck: No bruit or JVD Pulmonary: Clear to auscultation bilaterally Cardiac: Regular Rate and Rhythm without murmur Gastrointestinal: Soft, non-tender, non-distended, no mass, no scars Skin: No rash Extremity Pulses:  2+ radial, brachial, femoral, not palpable dorsalis pedis, posterior tibial pulses bilaterally.  Feet warm and well perfused without ulcers. Musculoskeletal: No deformity or edema  Neurologic: Upper and lower extremity motor grossly symmetric  DATA:  10/22/2018   Narrative & Impression   Carotid Arterial Duplex Study      Right Carotid Findings: +----------+--------+--------+--------+------------+--------+           PSV cm/sEDV cm/sStenosisDescribe    Comments +----------+--------+--------+--------+------------+--------+ CCA Prox  72      7               heterogenous         +----------+--------+--------+--------+------------+--------+ CCA Mid   64      9               heterogenous         +----------+--------+--------+--------+------------+--------+ CCA Distal72      11              heterogenous         +----------+--------+--------+--------+------------+--------+ ICA Prox  273     44      40-59%  heterogenous         +----------+--------+--------+--------+------------+--------+ ICA Mid   192     25                                   +----------+--------+--------+--------+------------+--------+ ICA Distal137     19                                    +----------+--------+--------+--------+------------+--------+ ECA       171     8  heterogenous         +----------+--------+--------+--------+------------+--------+  +----------+--------+-------+----------------+-------------------+           PSV cm/sEDV cmsDescribe        Arm Pressure (mmHG) +----------+--------+-------+----------------+-------------------+ [id number]             Multiphasic, WNL                    +----------+--------+-------+----------------+-------------------+  +---------+--------+--+--------+--+---------+ VertebralPSV cm/s61EDV cm/s12Antegrade +---------+--------+--+--------+--+---------+    Left Carotid Findings: +----------+--------+--------+--------+------------+--------+           PSV cm/sEDV cm/sStenosisDescribe    Comments +----------+--------+--------+--------+------------+--------+ CCA Prox  98      13              heterogenous         +----------+--------+--------+--------+------------+--------+ CCA Mid   114     21              heterogenous         +----------+--------+--------+--------+------------+--------+ CCA Distal116     12              heterogenous         +----------+--------+--------+--------+------------+--------+ ICA Prox  59      12      1-39%   heterogenous         +----------+--------+--------+--------+------------+--------+ ICA Mid   78      18      1-39%                        +----------+--------+--------+--------+------------+--------+ ICA Distal71      13                                   +----------+--------+--------+--------+------------+--------+ ECA       68                                           +----------+--------+--------+--------+------------+--------+  +----------+--------+--------+----------------+-------------------+ SubclavianPSV cm/sEDV cm/sDescribe        Arm Pressure  (mmHG) +----------+--------+--------+----------------+-------------------+           129             Multiphasic, WNL                    +----------+--------+--------+----------------+-------------------+  +---------+--------+--+--------+--+---------+ VertebralPSV cm/s44EDV cm/s12Antegrade +---------+--------+--+--------+--+---------+    Summary: Right Carotid: Velocities in the right ICA are consistent with a 40-59%                stenosis.  Left Carotid: Velocities in the left ICA are consistent with a 1-39% stenosis.  Vertebrals:  Bilateral vertebral arteries demonstrate antegrade flow. Subclavians: Normal flow hemodynamics were seen in bilateral subclavian              arteries.      ASSESSMENT:  Asymptomatic Right ICA stenosis   PLAN: He denies symptoms of stroke or TIA.  His right carotid stenosis shows no change when compared to the previous study.  We reviewed the signs and symptoms of stroke.  If they have problems or concerns they will call,  Otherwise he will have a repeat carotid duplex in 1 year.   Roxy Horseman PA-C [facility] Office: [phone]  MD in clinic [place]

## 2018-10-25 DIAGNOSIS — R109 Unspecified abdominal pain: Secondary | ICD-10-CM | POA: Diagnosis not present

## 2018-10-25 DIAGNOSIS — I959 Hypotension, unspecified: Secondary | ICD-10-CM | POA: Diagnosis not present

## 2018-10-25 DIAGNOSIS — R1030 Lower abdominal pain, unspecified: Secondary | ICD-10-CM | POA: Diagnosis not present

## 2018-10-25 DIAGNOSIS — Z79899 Other long term (current) drug therapy: Secondary | ICD-10-CM | POA: Diagnosis not present

## 2018-10-25 DIAGNOSIS — F0390 Unspecified dementia without behavioral disturbance: Secondary | ICD-10-CM | POA: Diagnosis not present

## 2018-10-25 DIAGNOSIS — R404 Transient alteration of awareness: Secondary | ICD-10-CM | POA: Diagnosis not present

## 2018-10-25 DIAGNOSIS — E7800 Pure hypercholesterolemia, unspecified: Secondary | ICD-10-CM | POA: Diagnosis not present

## 2018-10-25 DIAGNOSIS — R1032 Left lower quadrant pain: Secondary | ICD-10-CM | POA: Diagnosis not present

## 2018-10-25 DIAGNOSIS — I10 Essential (primary) hypertension: Secondary | ICD-10-CM | POA: Diagnosis not present

## 2018-10-25 DIAGNOSIS — R410 Disorientation, unspecified: Secondary | ICD-10-CM | POA: Diagnosis not present

## 2018-10-25 DIAGNOSIS — R1084 Generalized abdominal pain: Secondary | ICD-10-CM | POA: Diagnosis not present

## 2019-02-02 DIAGNOSIS — E1159 Type 2 diabetes mellitus with other circulatory complications: Secondary | ICD-10-CM | POA: Diagnosis not present

## 2019-02-02 DIAGNOSIS — E038 Other specified hypothyroidism: Secondary | ICD-10-CM | POA: Diagnosis not present

## 2019-02-02 DIAGNOSIS — I10 Essential (primary) hypertension: Secondary | ICD-10-CM | POA: Diagnosis not present

## 2019-02-02 DIAGNOSIS — E7849 Other hyperlipidemia: Secondary | ICD-10-CM | POA: Diagnosis not present

## 2019-02-09 DIAGNOSIS — E1121 Type 2 diabetes mellitus with diabetic nephropathy: Secondary | ICD-10-CM | POA: Diagnosis not present

## 2019-02-09 DIAGNOSIS — N183 Chronic kidney disease, stage 3 (moderate): Secondary | ICD-10-CM | POA: Diagnosis not present

## 2019-02-09 DIAGNOSIS — I2510 Atherosclerotic heart disease of native coronary artery without angina pectoris: Secondary | ICD-10-CM | POA: Diagnosis not present

## 2019-02-09 DIAGNOSIS — Z0000 Encounter for general adult medical examination without abnormal findings: Secondary | ICD-10-CM | POA: Diagnosis not present

## 2019-02-09 DIAGNOSIS — E785 Hyperlipidemia, unspecified: Secondary | ICD-10-CM | POA: Diagnosis not present

## 2019-02-09 DIAGNOSIS — E1169 Type 2 diabetes mellitus with other specified complication: Secondary | ICD-10-CM | POA: Diagnosis not present

## 2019-02-09 DIAGNOSIS — I6529 Occlusion and stenosis of unspecified carotid artery: Secondary | ICD-10-CM | POA: Diagnosis not present

## 2019-02-09 DIAGNOSIS — R809 Proteinuria, unspecified: Secondary | ICD-10-CM | POA: Diagnosis not present

## 2019-02-09 DIAGNOSIS — I129 Hypertensive chronic kidney disease with stage 1 through stage 4 chronic kidney disease, or unspecified chronic kidney disease: Secondary | ICD-10-CM | POA: Diagnosis not present

## 2019-02-09 DIAGNOSIS — E1159 Type 2 diabetes mellitus with other circulatory complications: Secondary | ICD-10-CM | POA: Diagnosis not present

## 2019-02-09 DIAGNOSIS — E039 Hypothyroidism, unspecified: Secondary | ICD-10-CM | POA: Diagnosis not present

## 2019-02-09 DIAGNOSIS — Z1331 Encounter for screening for depression: Secondary | ICD-10-CM | POA: Diagnosis not present

## 2019-03-31 DIAGNOSIS — L570 Actinic keratosis: Secondary | ICD-10-CM | POA: Diagnosis not present

## 2019-03-31 DIAGNOSIS — L819 Disorder of pigmentation, unspecified: Secondary | ICD-10-CM | POA: Diagnosis not present

## 2019-05-18 DIAGNOSIS — R9431 Abnormal electrocardiogram [ECG] [EKG]: Secondary | ICD-10-CM | POA: Diagnosis not present

## 2019-05-18 DIAGNOSIS — R001 Bradycardia, unspecified: Secondary | ICD-10-CM | POA: Diagnosis not present

## 2019-05-18 DIAGNOSIS — I10 Essential (primary) hypertension: Secondary | ICD-10-CM | POA: Diagnosis not present

## 2019-05-18 DIAGNOSIS — I2510 Atherosclerotic heart disease of native coronary artery without angina pectoris: Secondary | ICD-10-CM | POA: Diagnosis not present

## 2019-05-18 DIAGNOSIS — E782 Mixed hyperlipidemia: Secondary | ICD-10-CM | POA: Diagnosis not present

## 2019-05-25 DIAGNOSIS — Z23 Encounter for immunization: Secondary | ICD-10-CM | POA: Diagnosis not present

## 2019-08-10 DIAGNOSIS — E1159 Type 2 diabetes mellitus with other circulatory complications: Secondary | ICD-10-CM | POA: Diagnosis not present

## 2019-08-11 DIAGNOSIS — N1832 Chronic kidney disease, stage 3b: Secondary | ICD-10-CM | POA: Diagnosis not present

## 2019-08-11 DIAGNOSIS — E039 Hypothyroidism, unspecified: Secondary | ICD-10-CM | POA: Diagnosis not present

## 2019-08-11 DIAGNOSIS — Z8546 Personal history of malignant neoplasm of prostate: Secondary | ICD-10-CM | POA: Diagnosis not present

## 2019-08-11 DIAGNOSIS — I672 Cerebral atherosclerosis: Secondary | ICD-10-CM | POA: Diagnosis not present

## 2019-08-11 DIAGNOSIS — I129 Hypertensive chronic kidney disease with stage 1 through stage 4 chronic kidney disease, or unspecified chronic kidney disease: Secondary | ICD-10-CM | POA: Diagnosis not present

## 2019-08-11 DIAGNOSIS — E1169 Type 2 diabetes mellitus with other specified complication: Secondary | ICD-10-CM | POA: Diagnosis not present

## 2019-08-11 DIAGNOSIS — R569 Unspecified convulsions: Secondary | ICD-10-CM | POA: Diagnosis not present

## 2019-08-11 DIAGNOSIS — I2510 Atherosclerotic heart disease of native coronary artery without angina pectoris: Secondary | ICD-10-CM | POA: Diagnosis not present

## 2019-08-11 DIAGNOSIS — E1159 Type 2 diabetes mellitus with other circulatory complications: Secondary | ICD-10-CM | POA: Diagnosis not present

## 2019-08-11 DIAGNOSIS — I6529 Occlusion and stenosis of unspecified carotid artery: Secondary | ICD-10-CM | POA: Diagnosis not present

## 2019-08-11 DIAGNOSIS — E785 Hyperlipidemia, unspecified: Secondary | ICD-10-CM | POA: Diagnosis not present

## 2019-08-11 DIAGNOSIS — D692 Other nonthrombocytopenic purpura: Secondary | ICD-10-CM | POA: Diagnosis not present

## 2019-09-20 ENCOUNTER — Other Ambulatory Visit: Payer: Self-pay | Admitting: Neurology

## 2019-09-27 ENCOUNTER — Other Ambulatory Visit: Payer: Self-pay | Admitting: *Deleted

## 2019-09-27 ENCOUNTER — Telehealth: Payer: Self-pay | Admitting: Neurology

## 2019-09-27 MED ORDER — LEVETIRACETAM 500 MG PO TABS: 500.0000 mg | ORAL_TABLET | Freq: Two times a day (BID) | ORAL | 0 refills | Status: DC

## 2019-09-27 NOTE — Telephone Encounter (Signed)
Spoke with pt's wife. F/u scheduled. Keppra refill sent to pharmacy. Pt's wife understands masks required at appt. She verbalized appreciation.

## 2019-09-27 NOTE — Telephone Encounter (Signed)
Pt's wife called needing to discuss the pt's levETIRAcetam (KEPPRA) 500 MG tablet with RN or provider. Please advise.

## 2019-11-09 DIAGNOSIS — Z23 Encounter for immunization: Secondary | ICD-10-CM | POA: Diagnosis not present

## 2019-11-18 ENCOUNTER — Encounter: Payer: Self-pay | Admitting: Neurology

## 2019-11-18 ENCOUNTER — Ambulatory Visit (INDEPENDENT_AMBULATORY_CARE_PROVIDER_SITE_OTHER): Payer: Medicare Other | Admitting: Neurology

## 2019-11-18 ENCOUNTER — Other Ambulatory Visit: Payer: Self-pay

## 2019-11-18 VITALS — BP 173/62 | HR 53 | Temp 96.9°F | Ht 72.0 in | Wt 157.0 lb

## 2019-11-18 DIAGNOSIS — I679 Cerebrovascular disease, unspecified: Secondary | ICD-10-CM

## 2019-11-18 DIAGNOSIS — F0390 Unspecified dementia without behavioral disturbance: Secondary | ICD-10-CM

## 2019-11-18 DIAGNOSIS — R569 Unspecified convulsions: Secondary | ICD-10-CM

## 2019-11-18 MED ORDER — LEVETIRACETAM 500 MG PO TABS: 500.0000 mg | ORAL_TABLET | Freq: Two times a day (BID) | ORAL | 6 refills | Status: DC

## 2019-11-18 MED ORDER — DONEPEZIL HCL 10 MG PO TABS: 10.0000 mg | ORAL_TABLET | Freq: Every day | ORAL | 4 refills | Status: DC

## 2019-11-18 NOTE — Progress Notes (Signed)
[facility]    Provider:  Dr Jaynee Eagles Referring Provider: Velna Hatchet, MD Primary Care Physician:  Velna Hatchet, MD  CC:  Seizures and new issue of memory loss  Interval history 11/18/2019: No seizures, he feels his memory is stable.  He comes back with his son to the appointment. After the appointment I walked out to talk to his wife, she feels his short-term memory is worsening. But no seizures and overall he is happy and pleasant and he feels well.  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  Interval 08/26/2018: Hasn't had any sde effects to the Aricept. No swallowing problems. He still has short-term memory loss, stable.  He has fallen on wet grass and couldn't get up. He uses a cane. He has spinal stenosis. Also some numbness and tingling in the feet may also be a peripheral neuropathy causing imbalance. Always uses a cane. He has a walker but doesn't want to use it. He has good strength.   Interval history 02/09/2018: Here with his wife. At last appointment mentioned that pcp can refill but they are here to refill the medication and discuss something new. His short-term memory is worsening. He can do math but can't remember the day of the week, doesn't pay attention to the year and month. He pays the billsbut his wife double checks the bills.  He can remember most birthdays, not repeating the same stories. Mostly its days he can't remember. He drives to close places. Not getting lost. No family history of dementia, father was forgetful but he died very young and wife doesn't remember his father having problems. They take b12. They are active and still social. No personality changes, no hallucinations or delusions.   Interval history 08/13/2016:   He is doing very well. No events, no side  effects from the [place]. Discussed seizures. Discussed he should likely be on Keppra for life. Risks of seizures and reocurrence. Discussed seizure precautions. If he is doing well can follow up with his pcp who can prescribe Keppra. They do not wish to consider stopping the Keppra in the future, they prefer to just stay on it.   HPI:  Larry Ortega is a 86 y.o. male here as a referral from Dr. Ardeth Perfect for seizure. Past medical history of chronic kidney disease, lumbar stenosis. Patient was hospitalized at [facility] after onset of altered mentation and a witnessed seizure in the emergency room. He was discharged on Keppra 500 mg twice a day. Since he left the hospital no further episodes. Here with his wife of over 79 years who also provides information regarding the episodes. No personal or family history of seizures. He has improved. He feels great. He feels some weakness in the legs since hospitalization and he is in physical therapy. No further episodes of altered mentation or seizures. No family history or personal history of seizures. Previous to the episode, He was under some stress, a friend was sick and dying at the time. The friend died and they were executors and dealing with the state and they were working in her house and getting things in order. Wife says he had an episode of staring and unresponsiveness while in their friend's apartment, they called 911, he doesn;t remember much of it. He remembers possibly getting in the ambulance,  He didn't want to get into the ambulance. He just didn;t answer, he was sitting on the porch and not answering, no facial droop, he got into the car and holding on the steering wheel and didn;t move. No facial droop or other focal weakness and per wife he looked alright except for the altered awareness. He yelled and had a convulsive seizure which was witnessed in the emergency room. Since being discharged no altered awareness or any other issues. He feels his short-term  memory is impaired since leaving the hospital, attributes it to possibly Keppra.   Reviewed notes, labs and imaging from outside physicians, which showed:  Personally reviewed MRI images and agree with the following: 1. No acute intracranial abnormality identified. 2. Moderate chronic small vessel ischemic disease. 3. 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 4. Motion degraded head MRA without evidence of large vessel occlusion or flow limiting proximal stenosis  Most recent BMP showed normal sodium, potassium 3.4, normal chloride and CO2, elevated glucose 119, BUN 25, creatinine 1.17, GFR mildly decreased at 53 mL/min. CBC was unremarkable except mild anemia hemoglobin 12.8. LP showed elevated HSV antibody IgG but negative PCR. Protein was slightly elevated at 66, white blood cells were normal. Serum TSH was normal, serum vitamin B12 341 and HIV and RPR were negative.  Patient admitted 04/20/2016 with confusion, dizziness and reported facial droop. CT of the head was negative for acute changes. Initially thought to have left MCA infarct did not receive TPA. Patient developed seizure activity 2 on the ED and was loaded with Keppra and intubated for airway protection. LP was unremarkable. MRI showed chronic right frontal and right cerebellar infarcts with no acute abnormality. EEG with nonspecific diffuse cerebral dysfunction but no focal epileptiform activity. 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 Patient was discharged to rehabilitation.  Review of Systems: Patient complains of symptoms per HPI as well as the following symptoms: no CP or SOB. Dementia. Seizures. Gait disorder. Pertinent negatives per HPI. All others negative.   Social History   Socioeconomic History  . Marital status: Married    Spouse name: Not on file  . Number of children: 2  . Years of education: Not on file  . Highest education level: Bachelor's degree (e.g., BA, AB, BS)  Occupational  History  . Occupation: retired  Tobacco Use  . Smoking status: Never Smoker  . Smokeless tobacco: Never Used  Substance and Sexual Activity  . Alcohol use: No    Alcohol/week: 0.0 standard drinks  . Drug use: No  . Sexual activity: Not on file  Other Topics Concern  . Not on file  Social History Narrative   No alcohol or drug use   Right handed   Lives at home with his wife   Caffeine:4 cups daily (coffee x 2 and tea x 2), coffee with every meal      Social Determinants of Health   Financial Resource Strain:   . Difficulty of Paying Living Expenses: Not on file  Food Insecurity:   . Worried About Charity fundraiser in the Last Year: Not on file  . Ran Out of Food in the Last Year: Not on file  Transportation Needs:   . Lack of Transportation (Medical): Not on file  . Lack of Transportation (Non-Medical): Not on file  Physical Activity:   . Days of Exercise per Week: Not on file  . Minutes of Exercise per Session: Not on file  Stress:   . Feeling of Stress :  Not on file  Social Connections:   . Frequency of Communication with Friends and Family: Not on file  . Frequency of Social Gatherings with Friends and Family: Not on file  . Attends Religious Services: Not on file  . Active Member of Clubs or Organizations: Not on file  . Attends Archivist Meetings: Not on file  . Marital Status: Not on file  Intimate Partner Violence:   . Fear of Current or Ex-Partner: Not on file  . Emotionally Abused: Not on file  . Physically Abused: Not on file  . Sexually Abused: Not on file    History reviewed. No pertinent family history.  Past Medical History:  Diagnosis Date  . Balance problem   . Bilateral carotid artery occlusion   . CAD (coronary artery disease)    no h/o stent or bypass, followed annually  . Cancer [facility])    prostate  . Chronic kidney disease   . Hypertensive heart disease without congestive heart failure   . Kidney stone 2019  . Lumbar spinal  stenosis   . Lumbar spondylosis   . Memory problem    per wife  . Seizure ([place]) 2017    Past Surgical History:  Procedure Laterality Date  . BACK SURGERY    . BOWEL RESECTION    . CHOLECYSTECTOMY    . eye injections for fluid behind the R eye Right 2018-2019  . eyelid surgery      Current Outpatient Medications  Medication Sig Dispense Refill  . acetaminophen (TYLENOL) 325 MG tablet Take 1-2 tablets (325-650 mg total) by mouth every 6 (six) hours as needed for mild pain.    Marland Kitchen amLODipine (NORVASC) 10 MG tablet Take 10 mg by mouth daily.    . B Complex Vitamins (B COMPLEX PO) Take 1 tablet by mouth daily.     Marland Kitchen CALCIUM PO Take 600 mg by mouth daily.    . Cholecalciferol (VITAMIN D3) 2000 units TABS Take 2,000 Units by mouth daily.     Marland Kitchen CINNAMON PO Take 500 mg by mouth daily.    . clopidogrel (PLAVIX) 75 MG tablet Take 75 mg by mouth daily.    Marland Kitchen donepezil (ARICEPT) 10 MG tablet Take 1 tablet (10 mg total) by mouth at bedtime. 90 tablet 4  . levETIRAcetam (KEPPRA) 500 MG tablet Take 1 tablet (500 mg total) by mouth 2 (two) times daily. 180 tablet 6  . levothyroxine (SYNTHROID, LEVOTHROID) 50 MCG tablet Take 50 mcg by mouth daily. After taking Levothyroxine wait 30 minutes before taking other medications    . lisinopril (PRINIVIL,ZESTRIL) 10 MG tablet Take 10 mg by mouth daily.  3  . metoprolol tartrate (LOPRESSOR) 25 MG tablet Take 25 mg by mouth 2 (two) times daily.    . Omega-3 Fatty Acids (FISH OIL PO) Take 1,200 mg by mouth daily.    Marland Kitchen omeprazole (PRILOSEC) 20 MG capsule Take 20 mg by mouth daily.    . simvastatin (ZOCOR) 20 MG tablet Take 1 tablet (20 mg total) by mouth daily at 6 PM. 30 tablet    No current facility-administered medications for this visit.    Allergies as of 11/18/2019  . (No Known Allergies)    Vitals: BP (!) 173/62 (BP Location: Right Arm, Patient Position: Sitting)   Pulse (!) 53   Temp (!) 96.9 F (36.1 C) Comment: taken at front  Ht 6' (1.829 m)    Wt 157 lb (71.2 kg)   BMI 21.29 kg/m  Last  Weight:  Wt Readings from Last 1 Encounters:  11/18/19 157 lb (71.2 kg)   Last Height:   Ht Readings from Last 1 Encounters:  11/18/19 6' (1.829 m)    Physical exam: Exam: Gen: NAD                     Neuro: Detailed Neurologic Exam  Speech:    Speech is normal; fluent and spontaneous Cognition:  MMSE - Mini Mental State Exam 11/18/2019 08/26/2018 02/09/2018  Orientation to time 0 1 0  Orientation to Place 4 4 5   Registration 3 3 3   Attention/ Calculation 1 5 5   Recall 0 0 0  Language- name 2 objects 2 2 2   Language- repeat 1 1 1   Language- follow 3 step command 3 3 3   Language- read & follow direction 1 1 1   Write a sentence 1 1 1   Copy design 0 0 0  Total score 16 21 21      Cranial Nerves:    The pupils are equal, round, and reactive to light.  Visual fields are full to finger confrontation. Extraocular movements are intact. Trigeminal sensation is intact and the muscles of mastication are normal. Right eye ptosis (chronic) The face is symmetric. The palate elevates in the midline. Hearing intact. Voice is normal. Shoulder shrug is normal. The tongue has normal motion without fasciculations.    Gait:    uses a cane, wide based and shuffling  Motor Observation:    No asymmetry, no atrophy, and no involuntary movements noted. Tone:    Normal muscle tone.    Posture:    Posture is stooped    Strength:    Strength is V/V in the upper and lower limbs.         Assessment/Plan:  A 86 year old male with cerebrovascular disease and prior strokes with new onset seizures.  History suggests partial seizures with temporal lobe onset followed by secondary generalization. Agree that most likely etiology in this case would be underlying cerebrovascular disease as well as dementia. Continue Keppra likely for life, he is doing quite well in the way that he has not had any seizures however his dementia continues to worsen as  does his gait but no falls. Continue seizure precautions. Have been following him for his short term memory changes, MMSE 21/30 a few years ago and today is 16 out of 30, On aricept.  - Short-term memory loss.  Worsening MMSE. They declined workup such as labs, MRI, FDG-PET in the past, can just continue Aricept low dose and follow clinically.  - No seizures, stable, continue Keppra, no side effects  Meds ordered this encounter  Medications  . levETIRAcetam (KEPPRA) 500 MG tablet    Sig: Take 1 tablet (500 mg total) by mouth 2 (two) times daily.    Dispense:  180 tablet    Refill:  6  . donepezil (ARICEPT) 10 MG tablet    Sig: Take 1 tablet (10 mg total) by mouth at bedtime.    Dispense:  90 tablet    Refill:  4    Discussed Patients with epilepsy have a small risk of sudden unexpected death, a condition referred to as sudden unexpected death in epilepsy (SUDEP). SUDEP is defined specifically as the sudden, unexpected, witnessed or unwitnessed, nontraumatic and nondrowning death in patients with epilepsy with or without evidence for a seizure, and excluding documented status epilepticus, in which post mortem examination does not reveal a structural or toxicologic cause for  death   Cc: Velna Hatchet, MD  Sarina Ill, MD  [facility] [address] [place], [place] [address]  A total of 15 minutes was spent face-to-face with this patient. Over half this time was spent on counseling patient on the  1. Seizures ([place])   2. Cerebrovascular disease   3. Dementia without behavioral disturbance, unspecified dementia type ([place])     diagnosis and different diagnostic and therapeutic options, counseling and coordination of care, risks ans benefits of management, compliance, or risk factor reduction and education.     Phone [phone] Fax [phone]  A total of 25 minutes was spent face-to-face with this patient. Over half this time was spent on  counseling patient on the seizure, memory loss diagnosis and different diagnostic and therapeutic options available.

## 2019-11-22 DIAGNOSIS — F0390 Unspecified dementia without behavioral disturbance: Secondary | ICD-10-CM | POA: Insufficient documentation

## 2019-12-30 ENCOUNTER — Other Ambulatory Visit: Payer: Self-pay | Admitting: *Deleted

## 2019-12-30 DIAGNOSIS — I6521 Occlusion and stenosis of right carotid artery: Secondary | ICD-10-CM

## 2020-01-04 ENCOUNTER — Encounter (HOSPITAL_COMMUNITY): Payer: Medicare Other

## 2020-01-04 ENCOUNTER — Ambulatory Visit: Payer: Medicare Other

## 2020-02-08 DIAGNOSIS — E1159 Type 2 diabetes mellitus with other circulatory complications: Secondary | ICD-10-CM | POA: Diagnosis not present

## 2020-02-08 DIAGNOSIS — E7849 Other hyperlipidemia: Secondary | ICD-10-CM | POA: Diagnosis not present

## 2020-02-08 DIAGNOSIS — E038 Other specified hypothyroidism: Secondary | ICD-10-CM | POA: Diagnosis not present

## 2020-02-15 DIAGNOSIS — I10 Essential (primary) hypertension: Secondary | ICD-10-CM | POA: Diagnosis not present

## 2020-02-15 DIAGNOSIS — I2510 Atherosclerotic heart disease of native coronary artery without angina pectoris: Secondary | ICD-10-CM | POA: Diagnosis not present

## 2020-02-15 DIAGNOSIS — N1831 Chronic kidney disease, stage 3a: Secondary | ICD-10-CM | POA: Diagnosis not present

## 2020-02-15 DIAGNOSIS — E039 Hypothyroidism, unspecified: Secondary | ICD-10-CM | POA: Diagnosis not present

## 2020-02-15 DIAGNOSIS — R413 Other amnesia: Secondary | ICD-10-CM | POA: Diagnosis not present

## 2020-02-15 DIAGNOSIS — D692 Other nonthrombocytopenic purpura: Secondary | ICD-10-CM | POA: Diagnosis not present

## 2020-02-15 DIAGNOSIS — E1121 Type 2 diabetes mellitus with diabetic nephropathy: Secondary | ICD-10-CM | POA: Diagnosis not present

## 2020-02-15 DIAGNOSIS — I129 Hypertensive chronic kidney disease with stage 1 through stage 4 chronic kidney disease, or unspecified chronic kidney disease: Secondary | ICD-10-CM | POA: Diagnosis not present

## 2020-02-15 DIAGNOSIS — Z8546 Personal history of malignant neoplasm of prostate: Secondary | ICD-10-CM | POA: Diagnosis not present

## 2020-02-15 DIAGNOSIS — R82998 Other abnormal findings in urine: Secondary | ICD-10-CM | POA: Diagnosis not present

## 2020-02-15 DIAGNOSIS — E1159 Type 2 diabetes mellitus with other circulatory complications: Secondary | ICD-10-CM | POA: Diagnosis not present

## 2020-02-15 DIAGNOSIS — R569 Unspecified convulsions: Secondary | ICD-10-CM | POA: Diagnosis not present

## 2020-02-15 DIAGNOSIS — Z0000 Encounter for general adult medical examination without abnormal findings: Secondary | ICD-10-CM | POA: Diagnosis not present

## 2020-02-15 DIAGNOSIS — Z1331 Encounter for screening for depression: Secondary | ICD-10-CM | POA: Diagnosis not present

## 2020-02-18 ENCOUNTER — Other Ambulatory Visit: Payer: Self-pay | Admitting: *Deleted

## 2020-02-18 DIAGNOSIS — I6523 Occlusion and stenosis of bilateral carotid arteries: Secondary | ICD-10-CM

## 2020-02-18 DIAGNOSIS — I6521 Occlusion and stenosis of right carotid artery: Secondary | ICD-10-CM

## 2020-02-23 ENCOUNTER — Ambulatory Visit (HOSPITAL_COMMUNITY)
Admission: RE | Admit: 2020-02-23 | Discharge: 2020-02-23 | Disposition: A | Discharge status: Home / Self Care | Payer: Medicare Other | Source: Ambulatory Visit | Attending: Vascular Surgery | Admitting: Vascular Surgery

## 2020-02-23 ENCOUNTER — Other Ambulatory Visit: Payer: Self-pay

## 2020-02-23 ENCOUNTER — Ambulatory Visit (INDEPENDENT_AMBULATORY_CARE_PROVIDER_SITE_OTHER): Payer: Medicare Other | Admitting: Physician Assistant

## 2020-02-23 VITALS — BP 118/68 | HR 52 | Temp 97.0°F | Resp 18 | Ht 72.0 in | Wt 157.0 lb

## 2020-02-23 DIAGNOSIS — I6523 Occlusion and stenosis of bilateral carotid arteries: Secondary | ICD-10-CM | POA: Diagnosis present

## 2020-02-23 DIAGNOSIS — I6521 Occlusion and stenosis of right carotid artery: Secondary | ICD-10-CM | POA: Diagnosis not present

## 2020-02-23 NOTE — Progress Notes (Signed)
Office Note     CC:  follow up Requesting Provider:  Velna Hatchet, MD  HPI: Larry Ortega is a 86 y.o. (Feb 20, 1924) male who presents for follow up evaluation of carotid stenosis. His wife is present with him for visit. He does have some dementia. She explains that he has short term memory issues and also struggles with his balance. This has not changed over the past several years. He uses a rolling walker to get around. He has history of seizures. His wife explains that he was recently cleared by Neurologist and does not need further follow up with them. He presently remains asymptomatic in regard to his carotid stenosis. He denies any symptoms of TIA or stroke. He has not had any vision changes, difficulty speaking, facial drooping, weakness or numbness of upper or lower extremities. He continues to take Statin and Plavix.  The pt is on a statin for cholesterol management.  The pt is not on a daily aspirin.   Other AC: Plavix The pt is on CCB, ACE for hypertension.   The pt is not diabetic.  Tobacco hx: never  Past Medical History:  Diagnosis Date  . Balance problem   . Bilateral carotid artery occlusion   . CAD (coronary artery disease)    no h/o stent or bypass, followed annually  . Cancer [facility])    prostate  . Chronic kidney disease   . Hypertensive heart disease without congestive heart failure   . Kidney stone 2019  . Lumbar spinal stenosis   . Lumbar spondylosis   . Memory problem    per wife  . Seizure ([place]) 2017    Past Surgical History:  Procedure Laterality Date  . BACK SURGERY    . BOWEL RESECTION    . CHOLECYSTECTOMY    . eye injections for fluid behind the R eye Right 2018-2019  . eyelid surgery      Social History   Socioeconomic History  . Marital status: Married    Spouse name: Not on file  . Number of children: 2  . Years of education: Not on file  . Highest education level: Bachelor's degree (e.g., BA, AB, BS)  Occupational History  .  Occupation: retired  Tobacco Use  . Smoking status: Never Smoker  . Smokeless tobacco: Never Used  Substance and Sexual Activity  . Alcohol use: No    Alcohol/week: 0.0 standard drinks  . Drug use: No  . Sexual activity: Not on file  Other Topics Concern  . Not on file  Social History Narrative   No alcohol or drug use   Right handed   Lives at home with his wife   Caffeine:4 cups daily (coffee x 2 and tea x 2), coffee with every meal      Social Determinants of Health   Financial Resource Strain:   . Difficulty of Paying Living Expenses:   Food Insecurity:   . Worried About Charity fundraiser in the Last Year:   . Arboriculturist in the Last Year:   Transportation Needs:   . Film/video editor (Medical):   Marland Kitchen Lack of Transportation (Non-Medical):   Physical Activity:   . Days of Exercise per Week:   . Minutes of Exercise per Session:   Stress:   . Feeling of Stress :   Social Connections:   . Frequency of Communication with Friends and Family:   . Frequency of Social Gatherings with Friends and Family:   . Attends Religious  Services:   . Active Member of Clubs or Organizations:   . Attends Archivist Meetings:   Marland Kitchen Marital Status:   Intimate Partner Violence:   . Fear of Current or Ex-Partner:   . Emotionally Abused:   Marland Kitchen Physically Abused:   . Sexually Abused:    History reviewed. No pertinent family history.  Current Outpatient Medications  Medication Sig Dispense Refill  . acetaminophen (TYLENOL) 325 MG tablet Take 1-2 tablets (325-650 mg total) by mouth every 6 (six) hours as needed for mild pain.    Marland Kitchen amLODipine (NORVASC) 10 MG tablet Take 10 mg by mouth daily.    . B Complex Vitamins (B COMPLEX PO) Take 1 tablet by mouth daily.     Marland Kitchen CALCIUM PO Take 600 mg by mouth daily.    . Cholecalciferol (VITAMIN D3) 2000 units TABS Take 2,000 Units by mouth daily.     Marland Kitchen CINNAMON PO Take 500 mg by mouth daily.    . clopidogrel (PLAVIX) 75 MG tablet Take  75 mg by mouth daily.    Marland Kitchen donepezil (ARICEPT) 10 MG tablet Take 1 tablet (10 mg total) by mouth at bedtime. 90 tablet 4  . levETIRAcetam (KEPPRA) 500 MG tablet Take 1 tablet (500 mg total) by mouth 2 (two) times daily. 180 tablet 6  . levothyroxine (SYNTHROID, LEVOTHROID) 50 MCG tablet Take 50 mcg by mouth daily. After taking Levothyroxine wait 30 minutes before taking other medications    . lisinopril (PRINIVIL,ZESTRIL) 10 MG tablet Take 10 mg by mouth daily.  3  . metoprolol tartrate (LOPRESSOR) 25 MG tablet Take 25 mg by mouth 2 (two) times daily.    . Omega-3 Fatty Acids (FISH OIL PO) Take 1,200 mg by mouth daily.    Marland Kitchen omeprazole (PRILOSEC) 20 MG capsule Take 20 mg by mouth daily.    . simvastatin (ZOCOR) 20 MG tablet Take 1 tablet (20 mg total) by mouth daily at 6 PM. 30 tablet    No current facility-administered medications for this visit.    No Known Allergies   REVIEW OF SYSTEMS:  Review of Systems  Constitutional: Negative for chills, fever, malaise/fatigue and weight loss.  HENT: Negative for congestion, hearing loss and sore throat.   Eyes: Negative for blurred vision.  Respiratory: Negative for cough and shortness of breath.   Cardiovascular: Negative for chest pain, palpitations, claudication and leg swelling.  Gastrointestinal: Negative for abdominal pain, constipation, diarrhea, heartburn, nausea and vomiting.  Genitourinary: Negative for dysuria.  Musculoskeletal: Negative for joint pain.  Neurological: Positive for seizures. Negative for dizziness, tingling, speech change, focal weakness, loss of consciousness and headaches.  Endo/Heme/Allergies: Bruises/bleeds easily.  Psychiatric/Behavioral: Positive for memory loss.    PHYSICAL EXAMINATION:  Vitals:   02/23/20 1101  BP: 118/68  Pulse: (!) 52  Resp: 18  Temp: (!) 97 F (36.1 C)  TempSrc: Oral  SpO2: 98%  Weight: 157 lb (71.2 kg)  Height: 6' (1.829 m)    General:  WDWN in NAD; vital signs documented  above Gait: ambulates with rolling walker HENT: WNL, normocephalic Pulmonary: normal non-labored breathing , without Rales, rhonchi,  wheezing Cardiac: regular HR, without  Murmurs without carotid bruit Abdomen: soft, NT, no masses Skin: without rashes Vascular Exam/Pulses:  Right Left  Radial 2+ (normal) 2+ (normal)  Femoral 2+ (normal) 2+ (normal)  Popliteal Not palpable Not palpable  DP 2+ (normal) 2+ (normal)  PT 1+ (weak) 1+ (weak)   Extremities: without ischemic changes, without Gangrene , without  cellulitis; without open wounds;  Musculoskeletal: no muscle wasting or atrophy  Neurologic: A&O X 3;  No focal weakness or paresthesias are detected Psychiatric:  The pt has Normal affect.   Non-Invasive Vascular Imaging:   Right Carotid: Velocities in the right ICA are consistent with a 40-59% stenosis.   Left Carotid: Velocities in the left ICA are consistent with a 1-39% stenosis.   Vertebrals: Bilateral vertebral arteries demonstrate antegrade flow.  Subclavians: Normal flow hemodynamics were seen in bilateral subclavian arteries.    ASSESSMENT/PLAN:: 86 y.o. male here for follow up for follow up of carotid stenosis. He remains asymptomatic. He has not had any signs or symptoms of TIA or stroke. His non invasive study today is essentially unchanged. The velocities in the proximal ICA and ECA are slightly more elevated than previous study in 2020 but he still is in the 40-59% stenosis range in the right ICA - recommend continuing statin and Plavix - reviewed signs and symptoms of stroke/ TIA and advised them to present to emergency room should these occur - They will follow up sooner if they have any problems or concerns - he will follow up in 1 year with carotid duplex   Karoline Caldwell, PA-C [facility] [phone]  Clinic MD:  Dr. Oneida Alar

## 2020-05-17 DIAGNOSIS — L819 Disorder of pigmentation, unspecified: Secondary | ICD-10-CM | POA: Diagnosis not present

## 2020-05-17 DIAGNOSIS — L821 Other seborrheic keratosis: Secondary | ICD-10-CM | POA: Diagnosis not present

## 2020-05-17 DIAGNOSIS — L814 Other melanin hyperpigmentation: Secondary | ICD-10-CM | POA: Diagnosis not present

## 2020-05-17 DIAGNOSIS — D229 Melanocytic nevi, unspecified: Secondary | ICD-10-CM | POA: Diagnosis not present

## 2020-05-17 DIAGNOSIS — L570 Actinic keratosis: Secondary | ICD-10-CM | POA: Diagnosis not present

## 2020-05-23 DIAGNOSIS — I10 Essential (primary) hypertension: Secondary | ICD-10-CM | POA: Diagnosis not present

## 2020-05-23 DIAGNOSIS — R001 Bradycardia, unspecified: Secondary | ICD-10-CM | POA: Diagnosis not present

## 2020-05-23 DIAGNOSIS — E782 Mixed hyperlipidemia: Secondary | ICD-10-CM | POA: Diagnosis not present

## 2020-05-23 DIAGNOSIS — I2510 Atherosclerotic heart disease of native coronary artery without angina pectoris: Secondary | ICD-10-CM | POA: Diagnosis not present

## 2020-05-23 DIAGNOSIS — K219 Gastro-esophageal reflux disease without esophagitis: Secondary | ICD-10-CM | POA: Diagnosis not present

## 2020-08-01 DIAGNOSIS — Z23 Encounter for immunization: Secondary | ICD-10-CM | POA: Diagnosis not present

## 2020-08-14 DIAGNOSIS — F0390 Unspecified dementia without behavioral disturbance: Secondary | ICD-10-CM | POA: Diagnosis not present

## 2020-08-14 DIAGNOSIS — Z23 Encounter for immunization: Secondary | ICD-10-CM | POA: Diagnosis not present

## 2020-08-14 DIAGNOSIS — E1169 Type 2 diabetes mellitus with other specified complication: Secondary | ICD-10-CM | POA: Diagnosis not present

## 2020-08-14 DIAGNOSIS — R269 Unspecified abnormalities of gait and mobility: Secondary | ICD-10-CM | POA: Diagnosis not present

## 2020-09-05 DIAGNOSIS — S2231XA Fracture of one rib, right side, initial encounter for closed fracture: Secondary | ICD-10-CM | POA: Diagnosis not present

## 2020-11-22 ENCOUNTER — Ambulatory Visit: Payer: Medicare Other | Admitting: Neurology

## 2020-12-06 ENCOUNTER — Other Ambulatory Visit: Payer: Self-pay | Admitting: Neurology

## 2021-01-24 ENCOUNTER — Encounter: Payer: Self-pay | Admitting: Neurology

## 2021-01-24 ENCOUNTER — Ambulatory Visit (INDEPENDENT_AMBULATORY_CARE_PROVIDER_SITE_OTHER): Payer: Medicare Other | Admitting: Neurology

## 2021-01-24 ENCOUNTER — Telehealth: Payer: Self-pay | Admitting: Neurology

## 2021-01-24 VITALS — BP 174/65 | HR 47 | Ht 72.0 in | Wt 157.0 lb

## 2021-01-24 DIAGNOSIS — R569 Unspecified convulsions: Secondary | ICD-10-CM

## 2021-01-24 DIAGNOSIS — F0281 Dementia in other diseases classified elsewhere with behavioral disturbance: Secondary | ICD-10-CM | POA: Diagnosis not present

## 2021-01-24 DIAGNOSIS — G301 Alzheimer's disease with late onset: Secondary | ICD-10-CM | POA: Diagnosis not present

## 2021-01-24 MED ORDER — QUETIAPINE FUMARATE 25 MG PO TABS: ORAL_TABLET | ORAL | 6 refills | Status: AC

## 2021-01-24 MED ORDER — LEVETIRACETAM 500 MG PO TABS: 500.0000 mg | ORAL_TABLET | Freq: Two times a day (BID) | ORAL | 4 refills | Status: DC

## 2021-01-24 MED ORDER — DONEPEZIL HCL 10 MG PO TABS: 10.0000 mg | ORAL_TABLET | Freq: Every day | ORAL | 4 refills | Status: DC

## 2021-01-24 NOTE — Patient Instructions (Addendum)
- Continue Aricept and Keppra - We can schedule a phone call in about 6 months in the meantime send me messages throughout mychart and we can change medication as needed - Can try 12.5-25mg  Seroquel around 3pm before sundowning. If not tolerated can try zoloft. Hesitate to try Benzodiazepines but if patient becomes combatitive we can discus again (ativan for example)  Quetiapine tablets What is this medicine?  This medicine may be used for other purposes; ask your health care provider or pharmacist if you have questions. COMMON BRAND NAME(S): Seroquel What should I tell my health care provider before I take this medicine? They need to know if you have any of these conditions:  blockage in your bowel  cataracts  constipation  dementia  diabetes  difficulty swallowing  glaucoma  heart disease  high levels of prolactin  history of breast cancer  history of irregular heartbeat  liver disease  low blood counts, like low white cell, platelet, or red cell counts  low blood pressure  Parkinson's disease  prostate disease  seizures  suicidal thoughts, plans or attempt; a previous suicide attempt by you or a family member  thyroid disease  trouble passing urine  an unusual or allergic reaction to quetiapine, other medicines, foods, dyes, or preservatives  pregnant or trying to get pregnant  breast-feeding How should I use this medicine? Take this medicine by mouth. Swallow it with a drink of water. Follow the directions on the prescription label. If it upsets your stomach you can take it with food. Take your medicine at regular intervals. Do not take it more often than directed. Do not stop taking except on the advice of your doctor or health care professional. A special MedGuide will be given to you by the pharmacist with each prescription and refill. Be sure to read this information carefully each time. Talk to your pediatrician regarding the use of this medicine  in children. While this drug may be prescribed for children as young as 10 years for selected conditions, precautions do apply. Patients over age 85 years may have a stronger reaction to this medicine and need smaller doses. Overdosage: If you think you have taken too much of this medicine contact a poison control center or emergency room at once. NOTE: This medicine is only for you. Do not share this medicine with others. What if I miss a dose? If you miss a dose, take it as soon as you can. If it is almost time for your next dose, take only that dose. Do not take double or extra doses. What may interact with this medicine? Do not take this medicine with any of the following medications:  cisapride  dronedarone  metoclopramide  pimozide  thioridazine This medicine may also interact with the following medications:  alcohol  antihistamines for allergy, cough, and cold  atropine  avasimibe  certain antivirals for HIV or hepatitis  certain medicines for anxiety or sleep  certain medicines for bladder problems like oxybutynin, tolterodine  certain medicines for depression like amitriptyline, fluoxetine, nefazodone, sertraline  certain medicines for fungal infections like fluconazole, ketoconazole, itraconazole, posaconazole  certain medicines for stomach problems like dicyclomine, hyoscyamine  certain medicines for travel sickness like scopolamine  cimetidine  general anesthetics like halothane, isoflurane, methoxyflurane, propofol  ipratropium  levodopa or other medicines for Parkinson's disease  medicines for blood pressure  medicines for seizures  medicines that relax muscles for surgery  narcotic medicines for pain  other medicines that prolong the QT interval (cause  an abnormal heart rhythm)  phenothiazines like chlorpromazine, prochlorperazine  rifampin  St. John's wort This list may not describe all possible interactions. Give your health care  provider a list of all the medicines, herbs, non-prescription drugs, or dietary supplements you use. Also tell them if you smoke, drink alcohol, or use illegal drugs. Some items may interact with your medicine. What should I watch for while using this medicine? Visit your health care professional for regular checks on your progress. Tell your health care professional if symptoms do not start to get better or if they get worse. Do not stop taking except on your health care professional's advice. You may develop a severe reaction. Your health care professional will tell you how much medicine to take. You may need to have an eye exam before and during use of this medicine. This medicine may increase blood sugar. Ask your health care provider if changes in diet or medicines are needed if you have diabetes. Patients and their families should watch out for new or worsening depression or thoughts of suicide. Also watch out for sudden or severe changes in feelings such as feeling anxious, agitated, panicky, irritable, hostile, aggressive, impulsive, severely restless, overly excited and hyperactive, or not being able to sleep. If this happens, especially at the beginning of antidepressant treatment or after a change in dose, call your health care professional. Dennis Bast may get dizzy or drowsy. Do not drive, use machinery, or do anything that needs mental alertness until you know how this medicine affects you. Do not stand or sit up quickly, especially if you are an older patient. This reduces the risk of dizzy or fainting spells. Alcohol may interfere with the effect of this medicine. Avoid alcoholic drinks. This drug can cause problems with controlling your body temperature. It can lower the response of your body to cold temperatures. If possible, stay indoors during cold weather. If you must go outdoors, wear warm clothes. It can also lower the response of your body to heat. Do not overheat. Do not over-exercise. Stay  out of the sun when possible. If you must be in the sun, wear cool clothing. Drink plenty of water. If you have trouble controlling your body temperature, call your health care provider right away. What side effects may I notice from receiving this medicine? Side effects that you should report to your doctor or health care professional as soon as possible:  allergic reactions like skin rash, itching or hives, swelling of the face, lips, or tongue  breathing problems  changes in vision  confusion  elevated mood, decreased need for sleep, racing thoughts, impulsive behavior  eye pain  fast, irregular heartbeat  fever or chills, sore throat  inability to keep still  males: prolonged or painful erection  problems with balance, talking, walking  redness, blistering, peeling, or loosening of the skin, including inside the mouth  seizures  signs and symptoms of high blood sugar such as being more thirsty or hungry or having to urinate more than normal. You may also feel very tired or have blurry vision  signs and symptoms of hypothyroidism like fatigue; increased sensitivity to cold; weight gain; hoarseness; thinning hair  signs and symptoms of low blood pressure like dizziness; feeling faint or lightheaded; falls; unusually weak or tired  signs and symptoms of neuroleptic malignant syndrome like confusion; fast, irregular heartbeat; high fever; increased sweating; stiff muscles  sudden numbness or weakness of the face, arm, or leg  suicidal thoughts or other mood changes  trouble swallowing  uncontrollable movements of the arms, face, head, mouth, neck, or upper body Side effects that usually do not require medical attention (report to your doctor or health care professional if they continue or are bothersome):  change in sex drive or performance  constipation  drowsiness  dry mouth  upset stomach  weight gain This list may not describe all possible side effects.  Call your doctor for medical advice about side effects. You may report side effects to FDA at 1-800-FDA-1088. Where should I keep my medicine? Keep out of the reach of children. Store at room temperature between 15 and 30 degrees C (59 and 86 degrees F). Throw away any unused medicine after the expiration date. NOTE: This sheet is a summary. It may not cover all possible information. If you have questions about this medicine, talk to your doctor, pharmacist, or health care provider.  2021 Elsevier/Gold Standard (2019-07-28 14:42:48)

## 2021-01-24 NOTE — Addendum Note (Signed)
Addended by: Sarina Ill B on: 01/24/2021 04:07 PM   Modules accepted: Orders, Level of Service

## 2021-01-24 NOTE — Progress Notes (Addendum)
[facility]    Provider:  Dr Jaynee Eagles Referring Provider: Velna Hatchet, MD Primary Care Physician:  Velna Hatchet, MD  CC:  Seizures and new issue of memory loss  Interval history 01/24/2021: Here with his son. His wife drove him [place]. He is here with his son. His memory is 3-5 minutes. He becomes agitated. Around 4pm he becomes agitated. He sleeps a lot.  No hallucinations or delusions. He gets a little upset and frustrated. He goes to bed "anytime I want to". He sleeps until noon. He goes to sleep at 7pm. Memory continues to decline per son. Per patient he has "perfect memory".  No seizures. Appetite has been decreasing. He weighs 157, stable from last year. He gets choked on water. I discussed memantine which at this point I am not sure will help clinically. He is aggressive in the late  Afternoons.Just some days. We discussed benzo, seroquel,   Interval history 11/18/2019: No seizures, he feels his memory is stable.  He comes back with his son to the appointment. After the appointment I walked out to talk to his wife, she feels his short-term memory is worsening. But no seizures and overall he is happy and pleasant and he feels well.  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  Interval 08/26/2018: Hasn't had any sde effects to the Aricept. No swallowing problems. He still has short-term memory loss, stable.  He has fallen on wet grass and couldn't get up. He uses a cane. He has spinal stenosis. Also some numbness and tingling in the feet may also be a peripheral neuropathy causing imbalance. Always uses a cane. He has a walker but doesn't want to use it. He has good strength.   Interval history 02/09/2018: Here with his wife. At last appointment mentioned that pcp can refill but they are here  to refill the medication and discuss something new. His short-term memory is worsening. He can do math but can't remember the day of the week, doesn't pay attention to the year and month. He pays the billsbut his wife double checks the bills.  He can remember most birthdays, not repeating the same stories. Mostly its days he can't remember. He drives to close places. Not getting lost. No family history of dementia, father was forgetful but he died very young and wife doesn't remember his father having problems. They take b12. They are active and still social. No personality changes, no hallucinations or delusions.   Interval history 08/13/2016:   He is doing very well. No events, no side effects from the [place]. Discussed seizures. Discussed he should likely be on Keppra for life. Risks of seizures and reocurrence. Discussed seizure precautions. If he is doing well can follow up with his pcp who can prescribe Keppra. They do not wish to consider stopping the Keppra in the future, they prefer to just stay on it.   HPI:  Larry Ortega is a 87 y.o. male here as a referral from Dr. Ardeth Perfect for seizure. Past medical history of chronic kidney disease, lumbar stenosis. Patient was hospitalized at [facility] after onset of altered mentation and a witnessed seizure in the emergency room. He was discharged on Keppra 500 mg twice a day. Since he left the hospital no further episodes. Here with his wife of over  60 years who also provides information regarding the episodes. No personal or family history of seizures. He has improved. He feels great. He feels some weakness in the legs since hospitalization and he is in physical therapy. No further episodes of altered mentation or seizures. No family history or personal history of seizures. Previous to the episode, He was under some stress, a friend was sick and dying at the time. The friend died and they were executors and dealing with the state and they were working in her  house and getting things in order. Wife says he had an episode of staring and unresponsiveness while in their friend's apartment, they called 911, he doesn;t remember much of it. He remembers possibly getting in the ambulance, He didn't want to get into the ambulance. He just didn;t answer, he was sitting on the porch and not answering, no facial droop, he got into the car and holding on the steering wheel and didn;t move. No facial droop or other focal weakness and per wife he looked alright except for the altered awareness. He yelled and had a convulsive seizure which was witnessed in the emergency room. Since being discharged no altered awareness or any other issues. He feels his short-term memory is impaired since leaving the hospital, attributes it to possibly Keppra.   Reviewed notes, labs and imaging from outside physicians, which showed:  Personally reviewed MRI images and agree with the following: 1. No acute intracranial abnormality identified. 2. Moderate chronic small vessel ischemic disease. 3. 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 4. Motion degraded head MRA without evidence of large vessel occlusion or flow limiting proximal stenosis  Most recent BMP showed normal sodium, potassium 3.4, normal chloride and CO2, elevated glucose 119, BUN 25, creatinine 1.17, GFR mildly decreased at 53 mL/min. CBC was unremarkable except mild anemia hemoglobin 12.8. LP showed elevated HSV antibody IgG but negative PCR. Protein was slightly elevated at 66, white blood cells were normal. Serum TSH was normal, serum vitamin B12 341 and HIV and RPR were negative.  Patient admitted 04/20/2016 with confusion, dizziness and reported facial droop. CT of the head was negative for acute changes. Initially thought to have left MCA infarct did not receive TPA. Patient developed seizure activity 2 on the ED and was loaded with Keppra and intubated for airway protection. LP was unremarkable. MRI showed  chronic right frontal and right cerebellar infarcts with no acute abnormality. EEG with nonspecific diffuse cerebral dysfunction but no focal epileptiform activity. 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 Patient was discharged to rehabilitation.  Review of Systems: Patient complains of symptoms per HPI as well as the following symptoms: no CP or SOB. Dementia. Seizures. Gait disorder. Pertinent negatives per HPI. All others negative.   Social History   Socioeconomic History  . Marital status: Married    Spouse name: Not on file  . Number of children: 2  . Years of education: Not on file  . Highest education level: Bachelor's degree (e.g., BA, AB, BS)  Occupational History  . Occupation: retired  Tobacco Use  . Smoking status: Never Smoker  . Smokeless tobacco: Never Used  Vaping Use  . Vaping Use: Never used  Substance and Sexual Activity  . Alcohol use: No    Alcohol/week: 0.0 standard drinks  . Drug use: No  . Sexual activity: Not on file  Other Topics Concern  . Not on file  Social History Narrative   No alcohol or drug use   Right handed  Lives at home with his wife   Caffeine:4 cups daily (coffee x 2 and tea x 2), coffee with every meal      Social Determinants of Health   Financial Resource Strain: Not on file  Food Insecurity: Not on file  Transportation Needs: Not on file  Physical Activity: Not on file  Stress: Not on file  Social Connections: Not on file  Intimate Partner Violence: Not on file    History reviewed. No pertinent family history.  Past Medical History:  Diagnosis Date  . Balance problem   . Bilateral carotid artery occlusion   . CAD (coronary artery disease)    no h/o stent or bypass, followed annually  . [facility])    prostate  . Chronic kidney disease   . Hypertensive heart disease without congestive heart failure   . Kidney stone 2019  . Lumbar spinal stenosis   . Lumbar spondylosis   . Memory problem    per wife   . Seizure ([place]) 2017    Past Surgical History:  Procedure Laterality Date  . BACK SURGERY    . BOWEL RESECTION    . CHOLECYSTECTOMY    . eye injections for fluid behind the R eye Right 2018-2019  . eyelid surgery      Current Outpatient Medications  Medication Sig Dispense Refill  . acetaminophen (TYLENOL) 325 MG tablet Take 1-2 tablets (325-650 mg total) by mouth every 6 (six) hours as needed for mild pain.    Marland Kitchen amLODipine (NORVASC) 10 MG tablet Take 10 mg by mouth daily.    . B Complex Vitamins (B COMPLEX PO) Take 1 tablet by mouth daily.     Marland Kitchen CALCIUM PO Take 600 mg by mouth daily.    . Cholecalciferol (VITAMIN D3) 2000 units TABS Take 2,000 Units by mouth daily.     Marland Kitchen CINNAMON PO Take 500 mg by mouth daily.    . clopidogrel (PLAVIX) 75 MG tablet Take 75 mg by mouth daily.    Marland Kitchen donepezil (ARICEPT) 10 MG tablet Take 1 tablet (10 mg total) by mouth at bedtime. 90 tablet 4  . levETIRAcetam (KEPPRA) 500 MG tablet Take 1 tablet (500 mg total) by mouth 2 (two) times daily. 180 tablet 4  . levothyroxine (SYNTHROID, LEVOTHROID) 50 MCG tablet Take 50 mcg by mouth daily. After taking Levothyroxine wait 30 minutes before taking other medications    . lisinopril (PRINIVIL,ZESTRIL) 10 MG tablet Take 10 mg by mouth daily.  3  . metoprolol tartrate (LOPRESSOR) 25 MG tablet Take 25 mg by mouth 2 (two) times daily.    . Omega-3 Fatty Acids (FISH OIL PO) Take 1,200 mg by mouth daily.    Marland Kitchen omeprazole (PRILOSEC) 20 MG capsule Take 20 mg by mouth daily.    . QUEtiapine (SEROQUEL) 25 MG tablet Take 1/2 pill to one full pill in the late afternoons for agitation. 30 tablet 6  . simvastatin (ZOCOR) 20 MG tablet Take 1 tablet (20 mg total) by mouth daily at 6 PM. 30 tablet    No current facility-administered medications for this visit.    Allergies as of 01/24/2021  . (No Known Allergies)    Vitals: BP (!) 174/65 (BP Location: Right Arm, Patient Position: Sitting)   Pulse (!) 47   Ht 6' (1.829 m)    Wt 157 lb (71.2 kg)   BMI 21.29 kg/m  Last Weight:  Wt Readings from Last 1 Encounters:  01/24/21 157 lb (71.2 kg)   Last  Height:   Ht Readings from Last 1 Encounters:  01/24/21 6' (1.829 m)    Physical exam: Exam: Gen: NAD                     Neuro: Detailed Neurologic Exam  Speech:    Speech is normal; fluent and spontaneous Cognition:  MMSE - Mini Mental State Exam 11/18/2019 08/26/2018 02/09/2018  Orientation to time 0 1 0  Orientation to Place 4 4 5   Registration 3 3 3   Attention/ Calculation 1 5 5   Recall 0 0 0  Language- name 2 objects 2 2 2   Language- repeat 1 1 1   Language- follow 3 step command 3 3 3   Language- read & follow direction 1 1 1   Write a sentence 1 1 1   Copy design 0 0 0  Total score 16 21 21      Cranial Nerves:    The pupils are equal, round, and reactive to light.  Visual fields are full to finger confrontation. Extraocular movements are intact. Trigeminal sensation is intact and the muscles of mastication are normal. Right eye ptosis (chronic) The face is symmetric. The palate elevates in the midline. Hearing intact. Voice is normal. Shoulder shrug is normal. The tongue has normal motion without fasciculations.    Gait:    uses a cane, wide based and shuffling  Motor Observation:    No asymmetry, no atrophy, and no involuntary movements noted. Tone:    Normal muscle tone.    Posture:    Posture is stooped    Strength:    Strength is V/V in the upper and lower limbs.         Assessment/Plan:  A 87 year old male with cerebrovascular disease and prior strokes with new onset seizures.  History suggests partial seizures with temporal lobe onset followed by secondary generalization. Agree that most likely etiology in this case would be underlying cerebrovascular disease as well as dementia. Continue Keppra likely for life, he is doing quite well in the way that he has not had any seizures however his dementia continues to worsen as  does his gait but no falls. Continue seizure precautions. Have been following him for his short term memory changes, MMSE 21/30 a few years ago and today is 16 out of 30, On aricept.  - Short-term memory loss.  Worsening MMSE. They declined workup such as labs, MRI, FDG-PET in the past, can just continue Aricept low dose and follow clinically.  - No seizures, stable, continue Keppra, no side effects  - Continue Atricept  - Can try 12.5-25mg  Seroquel around 3pm before sundowning. If not tolerated can try zoloft. Hesitate to try Benzo but if patient becomes combatitive we can discus again.  Meds ordered this encounter  Medications  . donepezil (ARICEPT) 10 MG tablet    Sig: Take 1 tablet (10 mg total) by mouth at bedtime.    Dispense:  90 tablet    Refill:  4  . levETIRAcetam (KEPPRA) 500 MG tablet    Sig: Take 1 tablet (500 mg total) by mouth 2 (two) times daily.    Dispense:  180 tablet    Refill:  4  . QUEtiapine (SEROQUEL) 25 MG tablet    Sig: Take 1/2 pill to one full pill in the late afternoons for agitation.    Dispense:  30 tablet    Refill:  6    Discussed Patients with epilepsy have a small risk of sudden unexpected death, a condition referred  to as sudden unexpected death in epilepsy (SUDEP). SUDEP is defined specifically as the sudden, unexpected, witnessed or unwitnessed, nontraumatic and nondrowning death in patients with epilepsy with or without evidence for a seizure, and excluding documented status epilepticus, in which post mortem examination does not reveal a structural or toxicologic cause for death   Cc: Velna Hatchet, MD  Sarina Ill, MD  [facility] [address] [place], [place] [address]  A total of 15 minutes was spent face-to-face with this patient. Over half this time was spent on counseling patient on the  1. Seizures ([place])   2. Late onset Alzheimer's dementia with behavioral disturbance ([place])     diagnosis and  different diagnostic and therapeutic options, counseling and coordination of care, risks ans benefits of management, compliance, or risk factor reduction and education.     Phone [phone] Fax [phone]  A total of 25 minutes was spent face-to-face with this patient. Over half this time was spent on counseling patient on the seizure, memory loss diagnosis and different diagnostic and therapeutic options available.   I spent 20 minutes of face-to-face and non-face-to-face time with patient on the  1. Seizures ([place])   2. Late onset Alzheimer's dementia with behavioral disturbance ([place])    diagnosis.  This included previsit chart review, lab review, study review, order entry, electronic health record documentation, patient education on the different diagnostic and therapeutic options, counseling and coordination of care, risks and benefits of management, compliance, or risk factor reduction

## 2021-01-24 NOTE — Telephone Encounter (Signed)
We had a phone visit today scheduled. No answer on the home phone. I called and left 2 messages on his home number voice mail. I called his cell phone and the cell phone rang, no voice mail. Wife's cell phone picked up but voice mail was full. Let him know I refilled his medications and please reschedule him with an NP for a phone visit at their convenience in the next few months.  thanks.

## 2021-01-24 NOTE — Telephone Encounter (Signed)
LVM asking pt to cb to schedule appointment and relay medication info.

## 2021-02-15 ENCOUNTER — Other Ambulatory Visit: Payer: Self-pay | Admitting: *Deleted

## 2021-02-15 DIAGNOSIS — I6523 Occlusion and stenosis of bilateral carotid arteries: Secondary | ICD-10-CM

## 2021-03-12 ENCOUNTER — Encounter: Payer: Self-pay | Admitting: Vascular Surgery

## 2021-03-12 ENCOUNTER — Other Ambulatory Visit: Payer: Self-pay

## 2021-03-12 ENCOUNTER — Ambulatory Visit (INDEPENDENT_AMBULATORY_CARE_PROVIDER_SITE_OTHER): Payer: Medicare Other

## 2021-03-12 ENCOUNTER — Ambulatory Visit (INDEPENDENT_AMBULATORY_CARE_PROVIDER_SITE_OTHER): Payer: Medicare Other | Admitting: Vascular Surgery

## 2021-03-12 VITALS — BP 149/65 | HR 50 | Temp 98.2°F | Resp 18 | Ht 72.0 in | Wt 157.0 lb

## 2021-03-12 DIAGNOSIS — I6523 Occlusion and stenosis of bilateral carotid arteries: Secondary | ICD-10-CM | POA: Diagnosis not present

## 2021-03-12 NOTE — Progress Notes (Signed)
[facility]  Patient name: Rajohn Henery MRN: [id number] DOB: 11-Jul-1924 Sex: male  REASON FOR VISIT: Follow-up asymptomatic carotid disease  HPI: Eulon Allnutt is a 87 y.o. male here today with his son-in-law.  He continues to live independently with his wife.  He has family nearby keeps a close eye on him.  He has severe memory problems.  He has had no focal neurologic deficits  Past Medical History:  Diagnosis Date   Balance problem    Bilateral carotid artery occlusion    CAD (coronary artery disease)    no h/o stent or bypass, followed annually   Cancer ([place])    prostate   Chronic kidney disease    Hypertensive heart disease without congestive heart failure    Kidney stone 2019   Lumbar spinal stenosis    Lumbar spondylosis    Memory problem    per wife   Seizure ([place]) 2017    History reviewed. No pertinent family history.  SOCIAL HISTORY: Social History   Tobacco Use   Smoking status: Never   Smokeless tobacco: Never  Substance Use Topics   Alcohol use: No    Alcohol/week: 0.0 standard drinks    No Known Allergies  Current Outpatient Medications  Medication Sig Dispense Refill   acetaminophen (TYLENOL) 325 MG tablet Take 1-2 tablets (325-650 mg total) by mouth every 6 (six) hours as needed for mild pain.     amLODipine (NORVASC) 10 MG tablet Take 10 mg by mouth daily.     B Complex Vitamins (B COMPLEX PO) Take 1 tablet by mouth daily.      CALCIUM PO Take 600 mg by mouth daily.     Cholecalciferol (VITAMIN D3) 2000 units TABS Take 2,000 Units by mouth daily.      CINNAMON PO Take 500 mg by mouth daily.     clopidogrel (PLAVIX) 75 MG tablet Take 75 mg by mouth daily.     donepezil (ARICEPT) 10 MG tablet Take 1 tablet (10 mg total) by mouth at bedtime. 90 tablet 4   levETIRAcetam (KEPPRA) 500 MG tablet Take 1 tablet (500 mg total) by mouth 2 (two) times daily. 180 tablet 4   levothyroxine  (SYNTHROID, LEVOTHROID) 50 MCG tablet Take 50 mcg by mouth daily. After taking Levothyroxine wait 30 minutes before taking other medications     lisinopril (PRINIVIL,ZESTRIL) 10 MG tablet Take 10 mg by mouth daily.  3   metoprolol tartrate (LOPRESSOR) 25 MG tablet Take 25 mg by mouth 2 (two) times daily.     Omega-3 Fatty Acids (FISH OIL PO) Take 1,200 mg by mouth daily.     omeprazole (PRILOSEC) 20 MG capsule Take 20 mg by mouth daily.     QUEtiapine (SEROQUEL) 25 MG tablet Take 1/2 pill to one full pill in the late afternoons for agitation. 30 tablet 6   simvastatin (ZOCOR) 20 MG tablet Take 1 tablet (20 mg total) by mouth daily at 6 PM. 30 tablet    No current facility-administered medications for this visit.    REVIEW OF SYSTEMS:  [X]  denotes positive finding, [ ]  denotes negative finding Cardiac  Comments:  Chest pain or chest pressure:    Shortness of breath upon exertion:    Short of breath when lying flat:    Irregular heart rhythm:        Vascular    Pain in calf, thigh, or hip brought on by ambulation:    Pain in feet  at night that wakes you up from your sleep:     Blood clot in your veins:    Leg swelling:           PHYSICAL EXAM: Vitals:   03/12/21 0933  BP: (!) 149/65  Pulse: (!) 50  Resp: 18  Temp: 98.2 F (36.8 C)  TempSrc: Other (Comment)  SpO2: 99%  Weight: 157 lb (71.2 kg)  Height: 6' (1.829 m)    GENERAL: The patient is a well-nourished male, in no acute distress. The vital signs are documented above. CARDIOVASCULAR: Carotid arteries without bruits bilaterally.  2+ radial pulses bilaterally PULMONARY: There is good air exchange  MUSCULOSKELETAL: There are no major deformities or cyanosis. NEUROLOGIC: No focal weakness or paresthesias are detected. SKIN: There are no ulcers or rashes noted. PSYCHIATRIC: The patient has a normal affect.  DATA:  Carotid duplex today shows no change from prior study 1 year ago.  He has moderate 40 to 59% right and  insignificant 1 to 39% left stenosis  MEDICAL ISSUES: I discussed this in detail with the patient and his son-in-law.  Due to his advanced age I would not recommend follow-up.  This has been stable.  Even if he progressed to severe disease, would be very marginal candidate for recommendation for elective asymptomatic endarterectomy.  They are comfortable with this discussion.  Would certainly entertain the endarterectomy for any neurologic deficits to be related to carotid disease    Rosetta Posner, MD FACS [facility] [phone]  Note: Portions of this report may have been transcribed using voice recognition software.  Every effort has been made to ensure accuracy; however, inadvertent computerized transcription errors may still be present.

## 2021-07-30 ENCOUNTER — Telehealth: Payer: Medicare Other | Admitting: Neurology

## 2022-03-14 ENCOUNTER — Other Ambulatory Visit: Payer: Self-pay | Admitting: Neurology

## 2022-04-22 ENCOUNTER — Other Ambulatory Visit: Payer: Self-pay | Admitting: Neurology

## 2022-06-12 ENCOUNTER — Other Ambulatory Visit: Payer: Self-pay | Admitting: Neurology

## 2022-06-15 ENCOUNTER — Other Ambulatory Visit: Payer: Self-pay | Admitting: Neurology

## 2022-06-18 ENCOUNTER — Telehealth: Payer: Self-pay | Admitting: Neurology

## 2022-06-18 ENCOUNTER — Other Ambulatory Visit: Payer: Self-pay | Admitting: Neurology

## 2022-06-18 MED ORDER — LEVETIRACETAM 500 MG PO TABS: ORAL_TABLET | ORAL | 2 refills | Status: AC

## 2022-06-18 NOTE — Telephone Encounter (Signed)
Pt's wife called wanting to know if they are needing to drive all the way over here for an appt so that the pt can receive his refill for the  levETIRAcetam (KEPPRA) 500 MG tablet  90 day qt [employer]  Please advise.

## 2022-06-19 ENCOUNTER — Encounter: Payer: Self-pay | Admitting: Neurology

## 2022-06-19 NOTE — Telephone Encounter (Signed)
Called and spoke with pt's wife, she was appreciative for the [place] being called in. She wanted to call us back after the first of the year to schedule telephone visit.

## 2022-06-20 NOTE — Telephone Encounter (Signed)
I called [facility]. They do have the prescription but insurance will not allow refill until 06/24/22. They already have it scheduled to fill automatically on 06/24/22. I called the patient. She said she picked up refills last night of other medications and didn't realize until she got home that keppra was not included, then she called [place] this morning. I reassured her that it will be filled on Monday 10/2. Patient has approx 10 days of keppra left on current refill. She verbalized appreciation.

## 2022-06-20 NOTE — Telephone Encounter (Signed)
Pt's wife states she checked with the [facility] last night and the levETIRAcetam (KEPPRA) 500 MG tablet, had yet to be called in.

## 2022-08-06 ENCOUNTER — Emergency Department (HOSPITAL_BASED_OUTPATIENT_CLINIC_OR_DEPARTMENT_OTHER): Payer: Medicare Other | Admitting: Radiology

## 2022-08-06 ENCOUNTER — Emergency Department (HOSPITAL_BASED_OUTPATIENT_CLINIC_OR_DEPARTMENT_OTHER)
Admission: EM | Admit: 2022-08-06 | Discharge: 2022-08-06 | Disposition: A | Discharge status: Home / Self Care | Payer: Medicare Other | Attending: Emergency Medicine | Admitting: Emergency Medicine

## 2022-08-06 ENCOUNTER — Other Ambulatory Visit: Payer: Self-pay

## 2022-08-06 ENCOUNTER — Emergency Department (HOSPITAL_BASED_OUTPATIENT_CLINIC_OR_DEPARTMENT_OTHER): Payer: Medicare Other

## 2022-08-06 ENCOUNTER — Encounter (HOSPITAL_BASED_OUTPATIENT_CLINIC_OR_DEPARTMENT_OTHER): Payer: Self-pay | Admitting: Emergency Medicine

## 2022-08-06 DIAGNOSIS — G309 Alzheimer's disease, unspecified: Secondary | ICD-10-CM | POA: Insufficient documentation

## 2022-08-06 DIAGNOSIS — Y92009 Unspecified place in unspecified non-institutional (private) residence as the place of occurrence of the external cause: Secondary | ICD-10-CM | POA: Insufficient documentation

## 2022-08-06 DIAGNOSIS — F02818 Dementia in other diseases classified elsewhere, unspecified severity, with other behavioral disturbance: Secondary | ICD-10-CM | POA: Diagnosis not present

## 2022-08-06 DIAGNOSIS — N183 Chronic kidney disease, stage 3 unspecified: Secondary | ICD-10-CM | POA: Insufficient documentation

## 2022-08-06 DIAGNOSIS — Z79899 Other long term (current) drug therapy: Secondary | ICD-10-CM | POA: Diagnosis not present

## 2022-08-06 DIAGNOSIS — S4991XA Unspecified injury of right shoulder and upper arm, initial encounter: Secondary | ICD-10-CM | POA: Diagnosis not present

## 2022-08-06 DIAGNOSIS — S41112A Laceration without foreign body of left upper arm, initial encounter: Secondary | ICD-10-CM | POA: Diagnosis not present

## 2022-08-06 DIAGNOSIS — I1310 Hypertensive heart and chronic kidney disease without heart failure, with stage 1 through stage 4 chronic kidney disease, or unspecified chronic kidney disease: Secondary | ICD-10-CM | POA: Insufficient documentation

## 2022-08-06 DIAGNOSIS — Z8546 Personal history of malignant neoplasm of prostate: Secondary | ICD-10-CM | POA: Diagnosis not present

## 2022-08-06 DIAGNOSIS — I2510 Atherosclerotic heart disease of native coronary artery without angina pectoris: Secondary | ICD-10-CM | POA: Diagnosis not present

## 2022-08-06 DIAGNOSIS — S4992XA Unspecified injury of left shoulder and upper arm, initial encounter: Secondary | ICD-10-CM | POA: Diagnosis present

## 2022-08-06 DIAGNOSIS — Z23 Encounter for immunization: Secondary | ICD-10-CM | POA: Diagnosis not present

## 2022-08-06 DIAGNOSIS — W19XXXA Unspecified fall, initial encounter: Secondary | ICD-10-CM | POA: Insufficient documentation

## 2022-08-06 MED ORDER — TETANUS-DIPHTH-ACELL PERTUSSIS 5-2.5-18.5 LF-MCG/0.5 IM SUSY
0.5000 mL | PREFILLED_SYRINGE | Freq: Once | INTRAMUSCULAR | Status: AC
  Administered 2022-08-06: 0.5 mL via INTRAMUSCULAR
  Filled 2022-08-06: qty 0.5

## 2022-08-06 NOTE — ED Provider Notes (Addendum)
[facility] Provider Note  CSN: [id number] Arrival date & time: 08/06/22 1251  Chief Complaint(s) Fall  HPI Larry Ortega is a 88 y.o. male with history of Alzheimer's dementia, prior stroke, seizure disorder, hypertension, chronic kidney disease, coronary disease presenting after fall yesterday.  Patient was with wife at home, wife did not witness fall but found him on the ground and heard him fall.  He complained of right shoulder pain.  He also has skin tears to the posterior left arm.  His wife placed a bandage on the left arm.  Given ongoing pain, brought in today for further evaluation.  Unknown for head strike.  Has otherwise been behaving normally, no vomiting, difficulty breathing, diarrhea, cough.  Patient has been moving all of his arms and legs.  He has been able to bear weight.  He ambulates with a walker and has been able to do this.  History limited due to dementia   Past Medical History Past Medical History:  Diagnosis Date   Balance problem    Bilateral carotid artery occlusion    CAD (coronary artery disease)    no h/o stent or bypass, followed annually   Cancer ([place])    prostate   Chronic kidney disease    Hypertensive heart disease without congestive heart failure    Kidney stone 2019   Lumbar spinal stenosis    Lumbar spondylosis    Memory problem    per wife   Seizure ([place]) 2017   Patient Active Problem List   Diagnosis Date Noted   Dementia without behavioral disturbance ([place]) 11/22/2019   Cerebrovascular disease 08/31/2018   Late onset Alzheimer's dementia with behavioral disturbance ([place]) 08/31/2018   Short-term memory loss 02/10/2018   Diverticulitis 06/13/2017   Hyperglycemia 06/13/2017   CKD (chronic kidney disease), stage III ([place]) 06/13/2017   Hematochezia 06/12/2017   Abnormal laboratory test result 05/16/2016   Bradycardia 05/16/2016   Hypokalemia 05/02/2016   Prerenal azotemia 05/02/2016   Encephalopathy  04/25/2016   Essential hypertension 04/25/2016   Seizures ([place]) 04/25/2016   Aphasia    Acute respiratory failure ([place])    Cerebral infarction due to unspecified mechanism 04/20/2016   Cerebrovascular accident (CVA) due to thrombosis of precerebral artery ([place]) 04/20/2016   Cerebral infarction due to thrombosis of precerebral artery ([facility])    Carotid stenosis 09/26/2015   Home Medication(s) Prior to Admission medications   Medication Sig Start Date End Date Taking? Authorizing Provider  acetaminophen (TYLENOL) 325 MG tablet Take 1-2 tablets (325-650 mg total) by mouth every 6 (six) hours as needed for mild pain. 05/02/16   Love, Ivan Anchors, PA-C  amLODipine (NORVASC) 10 MG tablet Take 10 mg by mouth daily.    [provider]  B Complex Vitamins (B COMPLEX PO) Take 1 tablet by mouth daily.     [provider]  CALCIUM PO Take 600 mg by mouth daily.    [provider]  Cholecalciferol (VITAMIN D3) 2000 units TABS Take 2,000 Units by mouth daily.     [provider]  CINNAMON PO Take 500 mg by mouth daily.    [provider]  clopidogrel (PLAVIX) 75 MG tablet Take 75 mg by mouth daily.    [provider]  donepezil (ARICEPT) 10 MG tablet TAKE 1 TABLET BY MOUTH AT BEDTIME 06/18/22   Melvenia Beam, MD  levETIRAcetam (KEPPRA) 500 MG tablet TAKE 1 TABLET BY MOUTH TWICE DAILY. 06/18/22   Melvenia Beam, MD  levothyroxine (  SYNTHROID) 100 MCG tablet Take 100 mcg by mouth daily. 06/17/22   [provider]  levothyroxine (SYNTHROID, LEVOTHROID) 50 MCG tablet Take 50 mcg by mouth daily. After taking Levothyroxine wait 30 minutes before taking other medications 01/27/18   [provider]  lisinopril (PRINIVIL,ZESTRIL) 10 MG tablet Take 10 mg by mouth daily. 01/27/18   [provider]  metoprolol tartrate (LOPRESSOR) 25 MG tablet Take 25 mg by mouth 2 (two) times daily.    [provider]  Omega-3 Fatty Acids (FISH OIL PO)  Take 1,200 mg by mouth daily.    [provider]  omeprazole (PRILOSEC) 20 MG capsule Take 20 mg by mouth daily.    [provider]  QUEtiapine (SEROQUEL) 25 MG tablet Take 1/2 pill to one full pill in the late afternoons for agitation. 01/24/21   Melvenia Beam, MD  simvastatin (ZOCOR) 20 MG tablet Take 1 tablet (20 mg total) by mouth daily at 6 PM. 05/02/16   Love, Ivan Anchors, PA-C                                                                                                                                    Past Surgical History Past Surgical History:  Procedure Laterality Date   BACK SURGERY     BOWEL RESECTION     CHOLECYSTECTOMY     eye injections for fluid behind the R eye Right 2018-2019   eyelid surgery     Family History History reviewed. No pertinent family history.  Social History Social History   Tobacco Use   Smoking status: Never   Smokeless tobacco: Never  Vaping Use   Vaping Use: Never used  Substance Use Topics   Alcohol use: No    Alcohol/week: 0.0 standard drinks of alcohol   Drug use: No   Allergies Patient has no known allergies.  Review of Systems Review of Systems  Unable to perform ROS: Dementia    Physical Exam Vital Signs  I have reviewed the triage vital signs BP 137/76 (BP Location: Left Arm)   Pulse (!) 46   Temp 97.7 F (36.5 C) (Oral)   Resp 20   Ht 6' (1.829 m)   Wt 67.1 kg   SpO2 100%   BMI 20.07 kg/m  Physical Exam Vitals and nursing note reviewed.  Constitutional:      General: He is not in acute distress.    Appearance: Normal appearance.  HENT:     Head: Normocephalic and atraumatic.     Mouth/Throat:     Mouth: Mucous membranes are moist.  Eyes:     Conjunctiva/sclera: Conjunctivae normal.  Cardiovascular:     Rate and Rhythm: Normal rate and regular rhythm.  Pulmonary:     Effort: Pulmonary effort is normal. No respiratory distress.     Breath sounds: Normal breath sounds.  Abdominal:      General: Abdomen is flat.  Palpations: Abdomen is soft.     Tenderness: There is no abdominal tenderness.  Musculoskeletal:     Right lower leg: No edema.     Left lower leg: No edema.     Comments: No midline C, T, L-spine tenderness.  No chest wall tenderness or crepitus.  Full painless range of motion at the bilateral upper extremities including the left shoulder, both elbows, wrists, hand and fingers, and in the bilateral lower extremities including the hips, knees, ankle, toes. Slightly painful ROM of the right shoulder. Focal tenderness over right AC joint, otherwise no focal bony tenderness/deformity   Skin:    General: Skin is warm and dry.     Capillary Refill: Capillary refill takes less than 2 seconds.     Comments: Large approximately 7 x 3 cm area of avulsed skin to the posterior left upper arm, superiorly around 1 cm another area of avulsed skin approximately 3 x 1 cm   Neurological:     General: No focal deficit present.     Mental Status: He is alert. Mental status is at baseline.  Psychiatric:        Mood and Affect: Mood normal.        Behavior: Behavior normal.     ED Results and Treatments Labs (all labs ordered are listed, but only abnormal results are displayed) Labs Reviewed - No data to display                                                                                                                        Radiology CT Head Wo Contrast  Result Date: 08/06/2022 CLINICAL DATA:  [place] at home last evening. EXAM: CT HEAD WITHOUT CONTRAST CT CERVICAL SPINE WITHOUT CONTRAST TECHNIQUE: Multidetector CT imaging of the head and cervical spine was performed following the standard protocol without intravenous contrast. Multiplanar CT image reconstructions of the cervical spine were also generated. RADIATION DOSE REDUCTION: This exam was performed according to the departmental dose-optimization program which includes automated exposure control, adjustment of the mA  and/or kV according to patient size and/or use of iterative reconstruction technique. COMPARISON:  Head CT from 2017. Or recent head CTs are not available direct comparison. Cervical spine CT from 12/31/2017 is not available for comparison. FINDINGS: CT HEAD FINDINGS Brain: Progressive age related cerebral atrophy, ventriculomegaly and periventricular white matter disease. No extra-axial fluid collections are identified. No CT findings for acute hemispheric infarction or intracranial hemorrhage. No mass lesions. The brainstem and cerebellum are normal. Vascular: Vascular calcifications but no aneurysm or hyperdense vessels. Skull: No skull fracture. Sinuses/Orbits: The paranasal sinuses and mastoid air cells are clear. The globes are intact. Other: No scalp lesions or scalp hematoma. CT CERVICAL SPINE FINDINGS Alignment: Normal Skull base and vertebrae: No acute fracture. No primary bone lesion or focal pathologic process. Soft tissues and spinal canal: No prevertebral fluid or swelling. No visible canal hematoma. Disc levels: Degenerative cervical spondylosis with multilevel disc disease and facet disease but the  spinal canal is fairly generous. No significant spinal stenosis. Upper chest: The lung apices are grossly clear. Other: Bilateral carotid artery calcifications. IMPRESSION: 1. Progressive age related cerebral atrophy, ventriculomegaly and periventricular white matter disease. 2. No acute intracranial findings or skull fracture. 3. Degenerative cervical spondylosis with multilevel disc disease and facet disease but no acute cervical spine fracture. Electronically Signed   By: Marijo Sanes M.D.   On: 08/06/2022 16:25   CT Cervical Spine Wo Contrast  Result Date: 08/06/2022 CLINICAL DATA:  [place] at home last evening. EXAM: CT HEAD WITHOUT CONTRAST CT CERVICAL SPINE WITHOUT CONTRAST TECHNIQUE: Multidetector CT imaging of the head and cervical spine was performed following the standard protocol without  intravenous contrast. Multiplanar CT image reconstructions of the cervical spine were also generated. RADIATION DOSE REDUCTION: This exam was performed according to the departmental dose-optimization program which includes automated exposure control, adjustment of the mA and/or kV according to patient size and/or use of iterative reconstruction technique. COMPARISON:  Head CT from 2017. Or recent head CTs are not available direct comparison. Cervical spine CT from 12/31/2017 is not available for comparison. FINDINGS: CT HEAD FINDINGS Brain: Progressive age related cerebral atrophy, ventriculomegaly and periventricular white matter disease. No extra-axial fluid collections are identified. No CT findings for acute hemispheric infarction or intracranial hemorrhage. No mass lesions. The brainstem and cerebellum are normal. Vascular: Vascular calcifications but no aneurysm or hyperdense vessels. Skull: No skull fracture. Sinuses/Orbits: The paranasal sinuses and mastoid air cells are clear. The globes are intact. Other: No scalp lesions or scalp hematoma. CT CERVICAL SPINE FINDINGS Alignment: Normal Skull base and vertebrae: No acute fracture. No primary bone lesion or focal pathologic process. Soft tissues and spinal canal: No prevertebral fluid or swelling. No visible canal hematoma. Disc levels: Degenerative cervical spondylosis with multilevel disc disease and facet disease but the spinal canal is fairly generous. No significant spinal stenosis. Upper chest: The lung apices are grossly clear. Other: Bilateral carotid artery calcifications. IMPRESSION: 1. Progressive age related cerebral atrophy, ventriculomegaly and periventricular white matter disease. 2. No acute intracranial findings or skull fracture. 3. Degenerative cervical spondylosis with multilevel disc disease and facet disease but no acute cervical spine fracture. Electronically Signed   By: Marijo Sanes M.D.   On: 08/06/2022 16:25   DG Shoulder  Right  Result Date: 08/06/2022 CLINICAL DATA:  Fall, right shoulder pain EXAM: RIGHT SHOULDER - 2+ VIEW COMPARISON:  None Available. FINDINGS: AC joint is mildly widened without abnormal elevation of the distal clavicle relative to the acromion. Possible nondisplaced fracture of the distal clavicle seen only on [place] view. Degenerative spurring of the [facility] joint. Moderate arthropathy of the glenohumeral joint without fracture or dislocation. No soft tissue abnormality is evident. IMPRESSION: 1. Appearance suggestive of a type 2 AC joint injury with possible nondisplaced fracture of the distal clavicle. Correlate with point tenderness. 2. Moderate arthropathy of the glenohumeral and acromioclavicular joints. Electronically Signed   By: Davina Poke D.O.   On: 08/06/2022 13:49    Pertinent labs & imaging results that were available during my care of the patient were reviewed by me and considered in my medical decision making (see MDM for details).  Medications Ordered in ED Medications  Tdap (BOOSTRIX) injection 0.5 mL (has no administration in time range)  Procedures .Marland KitchenLaceration Repair  Date/Time: 08/06/2022 3:52 PM  Performed by: Cristie Hem, MD Authorized by: Cristie Hem, MD   Consent:    Consent obtained:  Verbal   Consent given by:  Guardian and healthcare agent   Risks, benefits, and alternatives were discussed: yes     Risks discussed:  Infection, pain, poor wound healing, nerve damage, poor cosmetic result, vascular damage, tendon damage, retained foreign body and need for additional repair   Alternatives discussed:  No treatment Universal protocol:    Procedure explained and questions answered to patient or proxy's satisfaction: yes     Patient identity confirmed:  Verbally with patient and arm band Anesthesia:    Anesthesia  method:  None Laceration details:    Location:  Shoulder/arm   Shoulder/arm location:  L upper arm   Length (cm):  7 Exploration:    Limited defect created (wound extended): no     Imaging outcome: foreign body not noted     Wound exploration: wound explored through full range of motion     Contaminated: no   Treatment:    Area cleansed with:  Shur-Clens   Amount of cleaning:  Standard   Visualized foreign bodies/material removed: no     Debridement:  None   Undermining:  None   Scar revision: no   Skin repair:    Repair method:  Tissue adhesive Approximation:    Approximation:  Close Repair type:    Repair type:  Simple .Marland KitchenLaceration Repair  Date/Time: 08/06/2022 3:53 PM  Performed by: Cristie Hem, MD Authorized by: Cristie Hem, MD   Consent:    Consent obtained:  Verbal   Consent given by:  Guardian   Risks, benefits, and alternatives were discussed: yes     Risks discussed:  Infection, need for additional repair, nerve damage, poor wound healing, pain, poor cosmetic result, retained foreign body, tendon damage and vascular damage   Alternatives discussed:  No treatment Universal protocol:    Procedure explained and questions answered to patient or proxy's satisfaction: yes     Patient identity confirmed:  Arm band and verbally with patient Anesthesia:    Anesthesia method:  None Laceration details:    Location:  Shoulder/arm   Shoulder/arm location:  L upper arm   Length (cm):  3 Exploration:    Limited defect created (wound extended): no     Wound exploration: wound explored through full range of motion     Contaminated: no   Treatment:    Area cleansed with:  Shur-Clens   Amount of cleaning:  Standard   Visualized foreign bodies/material removed: no     Debridement:  None   Undermining:  None   Scar revision: no   Skin repair:    Repair method:  Tissue adhesive Approximation:    Approximation:  Close Repair type:    Repair type:   Simple .Ortho Injury Treatment  Date/Time: 08/06/2022 3:53 PM  Performed by: Cristie Hem, MD Authorized by: Cristie Hem, MD   Consent:    Consent obtained:  Verbal   Consent given by:  Guardian   Risks discussed:  Restricted joint movement and stiffness   Alternatives discussed:  No treatmentInjury location: shoulder Location details: right shoulder Injury type: fracture-dislocation Dislocation type: type II AC separation Pre-procedure neurovascular assessment: neurovascularly intact Pre-procedure distal perfusion: normal Pre-procedure neurological function: normal Pre-procedure range of motion: reduced Manipulation performed: no Immobilization: sling Splint Applied by: ED Provider and ED Tech Post-procedure  neurovascular assessment: post-procedure neurovascularly intact Post-procedure distal perfusion: normal Post-procedure neurological function: normal Post-procedure range of motion: unchanged     (including critical care time)  Medical Decision Making / ED Course   MDM:  88 year old male presenting to the emergency department after fall.  On physical exam, he has focal tenderness over the right shoulder, x-ray demonstrates right AC joint separation with possible distal clavicle fracture which is consistent with physical exam.  No evidence of alternative injury on exam other than skin tears but no underlying bony tenderness.  Given unwitnessed fall will obtain CT head and CT cervical spine.  If negative, likely discharge.  Skin tears repaired with Dermabond.  See procedure note for details.  Clinical Course as of 08/06/22 1637  Tue Aug 06, 2022  1632 CT scan negative. Will discharge patient to home.  Discussed return precautions with caregivers at bedside.  Return precautions listed on discharge instructions. [WS]    Clinical Course User Index [WS] Cristie Hem, MD     Additional history obtained: -Additional history obtained from  family -External records from outside source obtained and reviewed including: Chart review including previous notes, labs, imaging, consultation notes including prior vaccination history     Imaging Studies ordered: I ordered imaging studies including CT head and neck, xr right shoulder On my interpretation imaging demonstrates ac joint separation I independently visualized and interpreted imaging. I agree with the radiologist interpretation   Medicines ordered and prescription drug management: Meds ordered this encounter  Medications   Tdap (BOOSTRIX) injection 0.5 mL    -I have reviewed the patients home medicines and have made adjustments as needed  Social Determinants of Health:  Diagnosis or treatment significantly limited by social determinants of health: dementia   Reevaluation: After the interventions noted above, I reevaluated the patient and found that they have improved  Co morbidities that complicate the patient evaluation  Past Medical History:  Diagnosis Date   Balance problem    Bilateral carotid artery occlusion    CAD (coronary artery disease)    no h/o stent or bypass, followed annually   Cancer ([place])    prostate   Chronic kidney disease    Hypertensive heart disease without congestive heart failure    Kidney stone 2019   Lumbar spinal stenosis    Lumbar spondylosis    Memory problem    per wife   Seizure ([place]) 2017      Dispostion: Disposition decision including need for hospitalization was considered, and patient discharged from emergency department.    Final Clinical Impression(s) / ED Diagnoses Final diagnoses:  Fall in home, initial encounter  Injury of right acromioclavicular joint, initial encounter  Skin tear of left upper arm without complication, initial encounter     This chart was dictated using voice recognition software.  Despite best efforts to proofread,  errors can occur which can change the documentation meaning.     Cristie Hem, MD 08/06/22 1633    Cristie Hem, MD 08/06/22 [phone]

## 2022-08-06 NOTE — ED Triage Notes (Signed)
Pt arrived POV in wheelchair with family. Pt's wife and son with pt stating pt fell at home last night, unwitnessed. Pt's wife heard him fall and found him on the floor, unknown if pt hit head/LOC (no obvious injury to the head), hx dementia. Pt alert to person and place at baseline. Pt c/o R shoulder pain and two skin tears to the left arm. Pt denies head/neck/back pain since fall. Pt in no obvious distress in triage.

## 2022-08-06 NOTE — Discharge Instructions (Signed)
We evaluated Mr. Eick for his fall.  His shoulder x-ray shows a separation of his AC joint, which is a mild sprain, he may also have a small fracture to his collarbone.  Treatment for this is to wear a sling for 3 to 7 days.  For pain, he can have 1000 mg of Tylenol every 6 hours.  I think it will be safe to go without the sling if he is refusing to wear the sling.  We also obtained a CT scan of the head and neck.  We did not see signs of any traumatic injury.  We repaired the wound to the back of his left arm with skin glue.  This will come off on its own in around 7 days.  Please follow-up with your primary doctor for a wound recheck.  Return if any new pain develops, any redness or swelling of the arm, any pus drainage from the arm, behavioral changes, somnolence, vomiting, or any other concerning symptoms.

## 2022-08-06 NOTE — ED Notes (Signed)
ED Provider at bedside. 

## 2023-02-01 ENCOUNTER — Other Ambulatory Visit: Payer: Self-pay

## 2023-02-01 ENCOUNTER — Emergency Department (HOSPITAL_COMMUNITY)
Admission: EM | Admit: 2023-02-01 | Discharge: 2023-02-01 | Disposition: A | Discharge status: Home / Self Care | Payer: Medicare Other | Attending: Emergency Medicine | Admitting: Emergency Medicine

## 2023-02-01 DIAGNOSIS — R600 Localized edema: Secondary | ICD-10-CM | POA: Diagnosis present

## 2023-02-01 DIAGNOSIS — R197 Diarrhea, unspecified: Secondary | ICD-10-CM | POA: Diagnosis not present

## 2023-02-01 LAB — COMPREHENSIVE METABOLIC PANEL
ALT: 31 U/L (ref 0–44)
AST: 29 U/L (ref 15–41)
Albumin: 3.3 g/dL — ABNORMAL LOW (ref 3.5–5.0)
Alkaline Phosphatase: 66 U/L (ref 38–126)
Anion gap: 9 (ref 5–15)
BUN: 23 mg/dL (ref 8–23)
CO2: 23 mmol/L (ref 22–32)
Calcium: 7.9 mg/dL — ABNORMAL LOW (ref 8.9–10.3)
Chloride: 107 mmol/L (ref 98–111)
Creatinine, Ser: 1.72 mg/dL — ABNORMAL HIGH (ref 0.61–1.24)
GFR, Estimated: 36 mL/min — ABNORMAL LOW (ref >60)
Glucose, Bld: 203 mg/dL — ABNORMAL HIGH (ref 70–99)
Potassium: 4.6 mmol/L (ref 3.5–5.1)
Sodium: 139 mmol/L (ref 135–145)
Total Bilirubin: 0.6 mg/dL (ref 0.3–1.2)
Total Protein: 6.2 g/dL — ABNORMAL LOW (ref 6.5–8.1)

## 2023-02-01 LAB — CBC WITH DIFFERENTIAL/PLATELET
Abs Immature Granulocytes: 0.05 10*3/uL (ref 0.00–0.07)
Basophils Absolute: 0 10*3/uL (ref 0.0–0.1)
Basophils Relative: 0 %
Eosinophils Absolute: 0.2 10*3/uL (ref 0.0–0.5)
Eosinophils Relative: 2 %
HCT: 33.3 % — ABNORMAL LOW (ref 39.0–52.0)
Hemoglobin: 11 g/dL — ABNORMAL LOW (ref 13.0–17.0)
Immature Granulocytes: 1 %
Lymphocytes Relative: 12 %
Lymphs Abs: 1.2 10*3/uL (ref 0.7–4.0)
MCH: 34 pg (ref 26.0–34.0)
MCHC: 33 g/dL (ref 30.0–36.0)
MCV: 102.8 fL — ABNORMAL HIGH (ref 80.0–100.0)
Monocytes Absolute: 1 10*3/uL (ref 0.1–1.0)
Monocytes Relative: 9 %
Neutro Abs: 8.1 10*3/uL — ABNORMAL HIGH (ref 1.7–7.7)
Neutrophils Relative %: 76 %
Platelets: 259 10*3/uL (ref 150–400)
RBC: 3.24 MIL/uL — ABNORMAL LOW (ref 4.22–5.81)
RDW: 16.8 % — ABNORMAL HIGH (ref 11.5–15.5)
WBC: 10.5 10*3/uL (ref 4.0–10.5)
nRBC: 0.4 % — ABNORMAL HIGH (ref 0.0–0.2)

## 2023-02-01 LAB — BRAIN NATRIURETIC PEPTIDE: B Natriuretic Peptide: 173.4 pg/mL — ABNORMAL HIGH (ref 0.0–100.0)

## 2023-02-01 MED ORDER — FUROSEMIDE 20 MG PO TABS: 20.0000 mg | ORAL_TABLET | Freq: Every morning | ORAL | 0 refills | Status: AC

## 2023-02-01 MED ORDER — LOPERAMIDE HCL 2 MG PO CAPS: 2.0000 mg | ORAL_CAPSULE | Freq: Four times a day (QID) | ORAL | 0 refills | Status: AC | PRN

## 2023-02-01 NOTE — ED Provider Notes (Signed)
[place] EMERGENCY DEPARTMENT AT [facility] Provider Note   CSN: [id number] Arrival date & time: 02/01/23  2058     History  Chief Complaint  Patient presents with   Leg Swelling    Larry Ortega is a 89 y.o. male.  HPI With dementia, level 5 caveat. This elderly male patient arrives via EMS.  Per EMS the family was concerned that the patient has had increasing lower extremity edema in the context of recent diarrheal illness. The patient himself cannot answer any questions with clarity, denies any complaints, wonders why he is in the hospital. Primis the patient was awake, alert, hemodynamically unremarkable in transport.    Home Medications Prior to Admission medications   Medication Sig Start Date End Date Taking? Authorizing Provider  furosemide (LASIX) 20 MG tablet Take 1 tablet (20 mg total) by mouth every morning for 5 days. 02/01/23 02/06/23 Yes Gerhard Munch, MD  loperamide (IMODIUM) 2 MG capsule Take 1 capsule (2 mg total) by mouth 4 (four) times daily as needed for diarrhea or loose stools. 02/01/23  Yes Gerhard Munch, MD  acetaminophen (TYLENOL) 325 MG tablet Take 1-2 tablets (325-650 mg total) by mouth every 6 (six) hours as needed for mild pain. 05/02/16   Love, Evlyn Kanner, PA-C  amLODipine (NORVASC) 10 MG tablet Take 10 mg by mouth daily.    [provider]  B Complex Vitamins (B COMPLEX PO) Take 1 tablet by mouth daily.     [provider]  CALCIUM PO Take 600 mg by mouth daily.    [provider]  Cholecalciferol (VITAMIN D3) 2000 units TABS Take 2,000 Units by mouth daily.     [provider]  CINNAMON PO Take 500 mg by mouth daily.    [provider]  clopidogrel (PLAVIX) 75 MG tablet Take 75 mg by mouth daily.    [provider]  donepezil (ARICEPT) 10 MG tablet TAKE 1 TABLET BY MOUTH AT BEDTIME 06/18/22   Anson Fret, MD  levETIRAcetam (KEPPRA) 500 MG tablet TAKE 1 TABLET BY MOUTH TWICE  DAILY. 06/18/22   Anson Fret, MD  levothyroxine (SYNTHROID) 100 MCG tablet Take 100 mcg by mouth daily. 06/17/22   [provider]  levothyroxine (SYNTHROID, LEVOTHROID) 50 MCG tablet Take 50 mcg by mouth daily. After taking Levothyroxine wait 30 minutes before taking other medications 01/27/18   [provider]  lisinopril (PRINIVIL,ZESTRIL) 10 MG tablet Take 10 mg by mouth daily. 01/27/18   [provider]  metoprolol tartrate (LOPRESSOR) 25 MG tablet Take 25 mg by mouth 2 (two) times daily.    [provider]  Omega-3 Fatty Acids (FISH OIL PO) Take 1,200 mg by mouth daily.    [provider]  omeprazole (PRILOSEC) 20 MG capsule Take 20 mg by mouth daily.    [provider]  QUEtiapine (SEROQUEL) 25 MG tablet Take 1/2 pill to one full pill in the late afternoons for agitation. 01/24/21   Anson Fret, MD  simvastatin (ZOCOR) 20 MG tablet Take 1 tablet (20 mg total) by mouth daily at 6 PM. 05/02/16   Love, Evlyn Kanner, PA-C      Allergies    Patient has no known allergies.    Review of Systems   Review of Systems  Unable to perform ROS: Dementia    Physical Exam Updated Vital Signs BP 120/66   Pulse 76   Resp 19   Ht 6' (1.829 m)   Wt 67.1  kg   SpO2 (!) 88%   BMI 20.06 kg/m  Physical Exam Vitals and nursing note reviewed.  Constitutional:      General: He is not in acute distress.    Appearance: He is well-developed.  HENT:     Head: Normocephalic and atraumatic.  Eyes:     Conjunctiva/sclera: Conjunctivae normal.  Cardiovascular:     Rate and Rhythm: Normal rate and regular rhythm.  Pulmonary:     Effort: Pulmonary effort is normal. No respiratory distress.     Breath sounds: No stridor.  Abdominal:     General: There is no distension.  Musculoskeletal:     Right lower leg: Edema present.     Left lower leg: Edema present.  Skin:    General: Skin is warm and dry.  Neurological:     Mental Status: He is alert.      Motor: Weakness and atrophy present.     Comments: Patient with clear speech, though disorientation is obvious. Age-appropriate atrophy. Patient does move all extremity spontaneously, and with the leg to command.  Psychiatric:        Cognition and Memory: Cognition is impaired. Memory is impaired.     ED Results / Procedures / Treatments   Labs (all labs ordered are listed, but only abnormal results are displayed) Labs Reviewed  BRAIN NATRIURETIC PEPTIDE - Abnormal; Notable for the following components:      Result Value   B Natriuretic Peptide 173.4 (*)    All other components within normal limits  COMPREHENSIVE METABOLIC PANEL - Abnormal; Notable for the following components:   Glucose, Bld 203 (*)    Creatinine, Ser 1.72 (*)    Calcium 7.9 (*)    Total Protein 6.2 (*)    Albumin 3.3 (*)    GFR, Estimated 36 (*)    All other components within normal limits  CBC WITH DIFFERENTIAL/PLATELET - Abnormal; Notable for the following components:   RBC 3.24 (*)    Hemoglobin 11.0 (*)    HCT 33.3 (*)    MCV 102.8 (*)    RDW 16.8 (*)    nRBC 0.4 (*)    Neutro Abs 8.1 (*)    All other components within normal limits  URINALYSIS, ROUTINE W REFLEX MICROSCOPIC    EKG EKG Interpretation  Date/Time:  Saturday Feb 01 2023 21:09:56 EDT Ventricular Rate:  77 PR Interval:  154 QRS Duration: 111 QT Interval:  427 QTC Calculation: 484 R Axis:   4 Text Interpretation: Sinus rhythm Anterior infarct, old Artifact Non-specific intra-ventricular conduction delay Abnormal ECG Confirmed by Gerhard Munch [phone]) on 02/01/2023 11:00:24 PM  Radiology No results found.  Procedures Procedures    Medications Ordered in ED Medications - No data to display  ED Course/ Medical Decision Making/ A&P  This patient with a Hx of dementia, CKD, hypertension, seizures presents to the ED for concern of lower extremity edema, diarrhea, this involves an extensive number of treatment options, and is a  complaint that carries with it a high risk of complications and morbidity.    The differential diagnosis includes venous return, heart failure, hepatorenal dysfunction, infection   Social Determinants of Health:  Faylene Million age, dementia  Additional history obtained:  Additional history and/or information obtained from EMS, notable for details above   After the initial evaluation, orders, including: Labs and monitoring were initiated.   Patient placed on Cardiac and Pulse-Oximetry Monitors. The patient was maintained on a cardiac monitor.  The cardiac monitored showed  an rhythm of sinus 75 normal The patient was also maintained on pulse oximetry. The readings were typically 100% room air normal, no increased work of breathing, no tachypnea.  There is 1 spurious vital sign documentation in the chart for 88%, this is an error.   On repeat evaluation of the patient stayed the same  Lab Tests:  I personally interpreted labs.  The pertinent results include: Minor evidence for dehydration with slight increase in creatinine from baseline, patient also has slight elevation in BNP.  The former may be secondary to the patient's ongoing diarrheal illness, latter may be new, patient has no history of recent echocardiogram.   Dispostion / Final MDM:  After consideration of the diagnostic results and the patient's response to treatment, patient was monitored for hours had no decompensation, continue to request going home.  At length conversation with the patient's wife, and family members eventually joined him at bedside.  Given his dementia, concern for mental status changes with admission, patient will return home, will start Lasix and Imodium, will follow-up with primary care.  Though there is some evidence for peripheral edema which may be secondary to mild CHF, no evidence for decompensation, increased work of breathing, and no clinical evidence for pneumonia.  Patient will return home with ongoing  medications, close outpatient follow-up. Final Clinical Impression(s) / ED Diagnoses Final diagnoses:  Peripheral edema  Diarrhea, unspecified type    Rx / DC Orders ED Discharge Orders          Ordered    loperamide (IMODIUM) 2 MG capsule  4 times daily PRN        02/01/23 2311    furosemide (LASIX) 20 MG tablet  Every morning        02/01/23 2311              Gerhard Munch, MD 02/01/23 2314

## 2023-02-01 NOTE — Discharge Instructions (Signed)
As discussed, your husband will start 2 medications, 1 for diarrhea control, and 1 to decrease the swelling in his lower extremities.  Please discuss these with his physician when he call on Monday.  Given the new lower extremity swelling, your husband may benefit from an echocardiogram.  This can be performed safely as an outpatient.  Particularly since your husband is having diarrhea, do not hesitate to keep him well-hydrated.  Return here for concerning changes in his condition.

## 2023-02-01 NOTE — ED Triage Notes (Signed)
Pt coming from home, wife stated that he has increased swelling in the hands and feet. Also diarrhea x3 days.   102/50 60 Hr 97% room air 185 CBG

## 2023-03-24 DEATH — deceased
